# Patient Record
Sex: Male | Born: 1985 | Race: White | Hispanic: No | Smoking: Former smoker
Health system: Southern US, Community
[De-identification: ages and names within clinical notes are randomized; demographics above are authoritative.]

## PROBLEM LIST (undated history)

## (undated) DIAGNOSIS — U071 COVID-19: Secondary | ICD-10-CM

## (undated) DIAGNOSIS — F329 Major depressive disorder, single episode, unspecified: Secondary | ICD-10-CM

## (undated) DIAGNOSIS — B019 Varicella without complication: Secondary | ICD-10-CM

## (undated) DIAGNOSIS — F1011 Alcohol abuse, in remission: Secondary | ICD-10-CM

## (undated) DIAGNOSIS — K5190 Ulcerative colitis, unspecified, without complications: Secondary | ICD-10-CM

## (undated) DIAGNOSIS — E119 Type 2 diabetes mellitus without complications: Secondary | ICD-10-CM

## (undated) DIAGNOSIS — E785 Hyperlipidemia, unspecified: Secondary | ICD-10-CM

## (undated) DIAGNOSIS — F32A Depression, unspecified: Secondary | ICD-10-CM

## (undated) DIAGNOSIS — I10 Essential (primary) hypertension: Secondary | ICD-10-CM

## (undated) HISTORY — DX: Varicella without complication: B01.9

## (undated) HISTORY — DX: Ulcerative colitis, unspecified, without complications: K51.90

## (undated) HISTORY — DX: COVID-19: U07.1

## (undated) HISTORY — DX: Depression, unspecified: F32.A

## (undated) HISTORY — DX: Hyperlipidemia, unspecified: E78.5

## (undated) HISTORY — DX: Essential (primary) hypertension: I10

## (undated) HISTORY — DX: Alcohol abuse, in remission: F10.11

## (undated) HISTORY — DX: Major depressive disorder, single episode, unspecified: F32.9

## (undated) HISTORY — DX: Type 2 diabetes mellitus without complications: E11.9

## (undated) HISTORY — PX: MANDIBLE FRACTURE SURGERY: SHX706

---

## 2011-09-03 DIAGNOSIS — Z87891 Personal history of nicotine dependence: Secondary | ICD-10-CM | POA: Insufficient documentation

## 2011-12-23 ENCOUNTER — Ambulatory Visit: Payer: Self-pay | Admitting: Otolaryngology

## 2014-05-01 DIAGNOSIS — F1021 Alcohol dependence, in remission: Secondary | ICD-10-CM | POA: Insufficient documentation

## 2016-06-04 ENCOUNTER — Ambulatory Visit: Payer: 59 | Admitting: Family Medicine

## 2016-06-11 ENCOUNTER — Encounter: Payer: Self-pay | Admitting: Family Medicine

## 2016-06-11 ENCOUNTER — Ambulatory Visit (INDEPENDENT_AMBULATORY_CARE_PROVIDER_SITE_OTHER): Payer: 59 | Admitting: Family Medicine

## 2016-06-11 VITALS — BP 132/92 | HR 71 | Temp 99.3°F | Ht 71.7 in | Wt 242.4 lb

## 2016-06-11 DIAGNOSIS — F1010 Alcohol abuse, uncomplicated: Secondary | ICD-10-CM

## 2016-06-11 DIAGNOSIS — F418 Other specified anxiety disorders: Secondary | ICD-10-CM

## 2016-06-11 DIAGNOSIS — E119 Type 2 diabetes mellitus without complications: Secondary | ICD-10-CM | POA: Diagnosis not present

## 2016-06-11 DIAGNOSIS — F419 Anxiety disorder, unspecified: Secondary | ICD-10-CM

## 2016-06-11 DIAGNOSIS — Z130 Encounter for screening for diseases of the blood and blood-forming organs and certain disorders involving the immune mechanism: Secondary | ICD-10-CM

## 2016-06-11 DIAGNOSIS — F329 Major depressive disorder, single episode, unspecified: Secondary | ICD-10-CM

## 2016-06-11 DIAGNOSIS — I10 Essential (primary) hypertension: Secondary | ICD-10-CM

## 2016-06-11 DIAGNOSIS — F32A Depression, unspecified: Secondary | ICD-10-CM

## 2016-06-11 DIAGNOSIS — E785 Hyperlipidemia, unspecified: Secondary | ICD-10-CM | POA: Diagnosis not present

## 2016-06-11 LAB — COMPREHENSIVE METABOLIC PANEL
ALT: 62 U/L — AB (ref 0–53)
AST: 30 U/L (ref 0–37)
Albumin: 4.7 g/dL (ref 3.5–5.2)
Alkaline Phosphatase: 58 U/L (ref 39–117)
BUN: 15 mg/dL (ref 6–23)
CALCIUM: 10.4 mg/dL (ref 8.4–10.5)
CHLORIDE: 101 meq/L (ref 96–112)
CO2: 27 mEq/L (ref 19–32)
Creatinine, Ser: 0.97 mg/dL (ref 0.40–1.50)
GFR: 95.86 mL/min (ref >60.00)
GLUCOSE: 118 mg/dL — AB (ref 70–99)
POTASSIUM: 3.7 meq/L (ref 3.5–5.1)
Sodium: 136 mEq/L (ref 135–145)
Total Bilirubin: 0.4 mg/dL (ref 0.2–1.2)
Total Protein: 7.5 g/dL (ref 6.0–8.3)

## 2016-06-11 LAB — CBC
HEMATOCRIT: 41.6 % (ref 39.0–52.0)
HEMOGLOBIN: 13.9 g/dL (ref 13.0–17.0)
MCHC: 33.5 g/dL (ref 30.0–36.0)
MCV: 84.3 fl (ref 78.0–100.0)
PLATELETS: 231 10*3/uL (ref 150.0–400.0)
RBC: 4.94 Mil/uL (ref 4.22–5.81)
RDW: 13.5 % (ref 11.5–15.5)
WBC: 9.4 10*3/uL (ref 4.0–10.5)

## 2016-06-11 LAB — HEMOGLOBIN A1C: Hgb A1c MFr Bld: 7.4 % — ABNORMAL HIGH (ref 4.6–6.5)

## 2016-06-11 NOTE — Patient Instructions (Signed)
Continue current medications.  We will call you with lab results.  Follow up in 6 months. Maybe sooner depending on your lab work.  Take care  Dr. Adriana Simasook

## 2016-06-14 ENCOUNTER — Telehealth: Payer: Self-pay | Admitting: Family Medicine

## 2016-06-14 ENCOUNTER — Encounter: Payer: Self-pay | Admitting: Family Medicine

## 2016-06-14 DIAGNOSIS — Z794 Long term (current) use of insulin: Secondary | ICD-10-CM | POA: Insufficient documentation

## 2016-06-14 DIAGNOSIS — I10 Essential (primary) hypertension: Secondary | ICD-10-CM | POA: Insufficient documentation

## 2016-06-14 DIAGNOSIS — F1010 Alcohol abuse, uncomplicated: Secondary | ICD-10-CM | POA: Insufficient documentation

## 2016-06-14 DIAGNOSIS — F32A Depression, unspecified: Secondary | ICD-10-CM | POA: Insufficient documentation

## 2016-06-14 DIAGNOSIS — F419 Anxiety disorder, unspecified: Secondary | ICD-10-CM | POA: Insufficient documentation

## 2016-06-14 DIAGNOSIS — F329 Major depressive disorder, single episode, unspecified: Secondary | ICD-10-CM | POA: Insufficient documentation

## 2016-06-14 DIAGNOSIS — E785 Hyperlipidemia, unspecified: Secondary | ICD-10-CM | POA: Insufficient documentation

## 2016-06-14 DIAGNOSIS — E119 Type 2 diabetes mellitus without complications: Secondary | ICD-10-CM | POA: Insufficient documentation

## 2016-06-14 DIAGNOSIS — E1165 Type 2 diabetes mellitus with hyperglycemia: Secondary | ICD-10-CM | POA: Insufficient documentation

## 2016-06-14 NOTE — Progress Notes (Signed)
Subjective:  Patient ID: Jorge Wright, male    DOB: [birth year]  Age: 30 y.o. MRN: [id number]  CC: Establish care  HPI Jorge Wright is a 30 y.o. male presents to the clinic today to establish care. Issues/problems are below.  DM-2  Blood sugars readings - Not checking.  Hypoglycemia - No.  Medications - Metformin 500 mg BID.  Adverse effects - No.  Compliance - Yes. Preventative care  Eye exam - In need of   Foot exam - Will do today.  Last A1C - In need of.  Urine microalbumin - On ACEI.  On Statin.   HTN  Stable on Lisinopril 10 mg daily. No reported side effects.  HLD  Stable but has not been at goal.  Will discuss increasing dose.  Anxiety  Stable on Lexapro and Buspar.  PMH, Surgical Hx, Family Hx, Social History reviewed and updated as below.  Past Medical History  Diagnosis Date  . Chicken pox   . Depression   . Diabetes mellitus without complication ([place])   . Alcohol abuse   . Hypertension   . Hyperlipidemia    Past Surgical History  Procedure Laterality Date  . Mandible fracture surgery      Family History  Problem Relation Age of Onset  . Alcohol abuse Mother   . Hypertension Mother   . Heart disease Mother   . Diabetes Mother   . Alcohol abuse Father   . Hypertension Father   . Diabetes Father   . Alcohol abuse Maternal Grandmother   . Heart disease Maternal Grandmother   . Hypertension Maternal Grandmother   . Stroke Paternal Grandfather    Social History  Substance Use Topics  . Smoking status: Former Research scientist (life sciences)  . Smokeless tobacco: Former Systems developer  . Alcohol Use: 10.8 oz/week    18 Standard drinks or equivalent per week   Review of Systems General: Denies unexplained weight loss, fever. Skin: Denies new or changing mole, sore/wound that won't heal. ENT: Trouble hearing, ringing in the ears, sores in the mouth, hoarseness, trouble swallowing. Eyes: Denies trouble seeing/visual disturbance. Heart/CV: Denies chest  pain, shortness of breath, edema, palpitations. Lungs/Resp: Denies cough, shortness of breath, hemoptysis. Abd/GI: Denies nausea, vomiting, diarrhea, constipation, abdominal pain, hematochezia, melena. GU: Denies dysuria, incontinence, hematuria, urinary frequency, difficulty starting/keeping stream, penile discharge, sexual difficulty, lump in testicles. MSK: Denies joint pain/swelling, myalgias. Neuro: Denies headaches, weakness, numbness, dizziness, syncope. Psych: Denies sadness, anxiety, stress, memory difficulty. Endocrine: Denies polyuria and polydipsia.  Objective:   Today's Vitals: BP 132/92 mmHg  Pulse 71  Temp(Src) 99.3 F (37.4 C) (Oral)  Ht 5' 11.7" (1.821 m)  Wt 242 lb 6 oz (109.941 kg)  BMI 33.15 kg/m2  SpO2 98%  Physical Exam  Constitutional: He is oriented to person, place, and time. He appears well-developed and well-nourished. No distress.  HENT:  Head: Normocephalic and atraumatic.  Nose: Nose normal.  Mouth/Throat: Oropharynx is clear and moist. No oropharyngeal exudate.  Normal TM's bilaterally.   Eyes: Conjunctivae are normal. No scleral icterus.  Neck: Neck supple. No thyromegaly present.  Cardiovascular: Normal rate and regular rhythm.   No murmur heard. Pulmonary/Chest: Effort normal and breath sounds normal. He has no wheezes. He has no rales.  Abdominal: Soft. He exhibits no distension. There is no tenderness. There is no rebound and no guarding.  Musculoskeletal: Normal range of motion. He exhibits no edema.  Lymphadenopathy:    He has no cervical adenopathy.  Neurological: He is alert  and oriented to person, place, and time.  Skin: Skin is warm and dry. No rash noted.  Psychiatric: He has a normal mood and affect.  Vitals reviewed. Diabetic Foot Check -  Appearance - no lesions, ulcers or calluses Skin - no unusual pallor or redness Monofilament testing -  Right - Great toe, medial, central, lateral ball and posterior foot intact Left - Great  toe, medial, central, lateral ball and posterior foot intact  Assessment & Plan:   Problem List Items Addressed This Visit    Alcohol abuse    Advised to cut back.      Anxiety and depression    Stable on Lexapro and Buspar. Will continue.      DM type 2 (diabetes mellitus, type 2) (HCC)    A1C 7.4. Continue metformin.  Will discuss adding additional med with patient (A1C resulted after visit).       Relevant Medications   atorvastatin (LIPITOR) 20 MG tablet   lisinopril (PRINIVIL,ZESTRIL) 10 MG tablet   metFORMIN (GLUCOPHAGE-XR) 500 MG 24 hr tablet   Other Relevant Orders   Comp Met (CMET) (Completed)   HgB A1c (Completed)   HLD (hyperlipidemia)    Not at goal. Increasing Lipitor to 40 mg.      Relevant Medications   atorvastatin (LIPITOR) 20 MG tablet   lisinopril (PRINIVIL,ZESTRIL) 10 MG tablet   HTN (hypertension)    Stable. Continue Lisinopril.      Relevant Medications   atorvastatin (LIPITOR) 20 MG tablet   lisinopril (PRINIVIL,ZESTRIL) 10 MG tablet    Other Visit Diagnoses    Screening for deficiency anemia    -  Primary    Relevant Orders    CBC (Completed)       Outpatient Encounter Prescriptions as of 06/11/2016  Medication Sig  . atorvastatin (LIPITOR) 20 MG tablet daily.  . busPIRone (BUSPAR) 10 MG tablet 2 (two) times daily.  Marland Kitchen escitalopram (LEXAPRO) 20 MG tablet daily.  Marland Kitchen lisinopril (PRINIVIL,ZESTRIL) 10 MG tablet daily.  . metFORMIN (GLUCOPHAGE-XR) 500 MG 24 hr tablet 2 (two) times daily.   No facility-administered encounter medications on file as of 06/11/2016.    Follow-up: Return for Follow up Chronic medical issues.  [place]

## 2016-06-14 NOTE — Assessment & Plan Note (Signed)
A1C 7.4. Continue metformin.  Will discuss adding additional med with patient (A1C resulted after visit).

## 2016-06-14 NOTE — Telephone Encounter (Signed)
Returning [name]'s call regarding lab results.

## 2016-06-14 NOTE — Telephone Encounter (Signed)
Pt called and given results 

## 2016-06-14 NOTE — Assessment & Plan Note (Signed)
Stable on Lexapro and Buspar. Will continue.

## 2016-06-14 NOTE — Assessment & Plan Note (Signed)
Advised to cut back

## 2016-06-14 NOTE — Assessment & Plan Note (Signed)
Not at goal. Increasing Lipitor to 40 mg.

## 2016-06-14 NOTE — Assessment & Plan Note (Signed)
Stable. Continue Lisinopril.  

## 2016-06-15 ENCOUNTER — Other Ambulatory Visit: Payer: Self-pay | Admitting: Family Medicine

## 2016-06-15 MED ORDER — CANAGLIFLOZIN 100 MG PO TABS: 100.0000 mg | ORAL_TABLET | Freq: Every day | ORAL | Status: DC

## 2016-09-13 ENCOUNTER — Ambulatory Visit: Payer: 59 | Admitting: Family Medicine

## 2016-09-29 ENCOUNTER — Ambulatory Visit (INDEPENDENT_AMBULATORY_CARE_PROVIDER_SITE_OTHER): Payer: Managed Care, Other (non HMO) | Admitting: Family Medicine

## 2016-09-29 VITALS — BP 134/82 | HR 75 | Temp 98.5°F | Wt 230.5 lb

## 2016-09-29 DIAGNOSIS — F1010 Alcohol abuse, uncomplicated: Secondary | ICD-10-CM

## 2016-09-29 DIAGNOSIS — I10 Essential (primary) hypertension: Secondary | ICD-10-CM

## 2016-09-29 DIAGNOSIS — Z23 Encounter for immunization: Secondary | ICD-10-CM

## 2016-09-29 DIAGNOSIS — E119 Type 2 diabetes mellitus without complications: Secondary | ICD-10-CM | POA: Diagnosis not present

## 2016-09-29 DIAGNOSIS — E785 Hyperlipidemia, unspecified: Secondary | ICD-10-CM | POA: Diagnosis not present

## 2016-09-29 LAB — LIPID PANEL
CHOL/HDL RATIO: 5
Cholesterol: 183 mg/dL (ref 0–200)
HDL: 39.6 mg/dL (ref >39.00)
NONHDL: 143.69
TRIGLYCERIDES: 207 mg/dL — AB (ref 0.0–149.0)
VLDL: 41.4 mg/dL — ABNORMAL HIGH (ref 0.0–40.0)

## 2016-09-29 LAB — LDL CHOLESTEROL, DIRECT: LDL DIRECT: 118 mg/dL

## 2016-09-29 LAB — HEMOGLOBIN A1C: Hgb A1c MFr Bld: 6.8 % — ABNORMAL HIGH (ref 4.6–6.5)

## 2016-09-29 MED ORDER — LISINOPRIL 10 MG PO TABS: 10.0000 mg | ORAL_TABLET | Freq: Every day | ORAL | 3 refills | Status: DC

## 2016-09-29 NOTE — Assessment & Plan Note (Signed)
Well controlled. We'll continue lisinopril. Refilled today.

## 2016-09-29 NOTE — Assessment & Plan Note (Signed)
Stable. Rechecking A1C today. Continue metformin. Will add Invokana if needed based on A1c.

## 2016-09-29 NOTE — Progress Notes (Signed)
Pre visit review using our clinic review tool, if applicable. No additional management support is needed unless otherwise documented below in the visit note. 

## 2016-09-29 NOTE — Patient Instructions (Signed)
We will call with your results.  Follow up in 6 months.  Take care  Dr. Ryley Bachtel  

## 2016-09-29 NOTE — Assessment & Plan Note (Signed)
Improved. Patient congratulated on cutting back.

## 2016-09-29 NOTE — Progress Notes (Signed)
Subjective:  Patient ID: Jorge Wright, male    DOB: 03-06-1986  Age: 30 y.o. MRN: [id number]  CC: Follow up  HPI:  30 year old male with DM 2, hypertension, hyperlipidemia, alcohol abuse presents for follow up.  DM-2  Most recent A1c was 7.4.  Patient has made some dietary and lifestyle changes. He has cut back on his alcohol intake and has been exercising regular.  In need of A1c today.  Endorses compliance with metformin.  Has not yet started the Invokana.  HTN  Well controlled on lisinopril.  In need of refill.  HLD  Recently increased Lipitor.  Unsure of control. Most recent LDL was 134 (2016).  In need of lipid panel today.  Alcohol abuse  Patient has cut back significantly.  He now drinking a sixpack on the weekends. He was previously drinking 18 beers a week.  Social Hx    Social History   Social History  . Marital status: Married    Spouse name: N/A  . Number of children: N/A  . Years of education: N/A   Social History Main Topics  . Smoking status: Former Games developer  . Smokeless tobacco: Former Neurosurgeon  . Alcohol use 10.8 oz/week    18 Standard drinks or equivalent per week  . Drug use:     Types: Marijuana  . Sexual activity: Not on file   Other Topics Concern  . Not on file   Social History Narrative  . No narrative on file   Review of Systems  Constitutional: Negative.   Gastrointestinal:       Hemorrhoid.   Objective:  BP 134/82 (BP Location: Left Arm, Patient Position: Sitting, Cuff Size: Large)   Pulse 75   Temp 98.5 F (36.9 C) (Oral)   Wt 230 lb 8 oz (104.6 kg)   SpO2 98%   BMI 31.52 kg/m   BP/Weight 09/29/2016 06/11/2016  Systolic BP 134 132  Diastolic BP 82 92  Wt. (Lbs) 230.5 242.38  BMI 31.52 33.15   Physical Exam  Constitutional: He is oriented to person, place, and time. He appears well-developed. No distress.  Cardiovascular: Normal rate and regular rhythm.   Pulmonary/Chest: Effort normal. He has no wheezes.  He has no rales.  Abdominal: Soft. He exhibits no distension. There is no tenderness. There is no rebound and no guarding.  Neurological: He is alert and oriented to person, place, and time.  Psychiatric: He has a normal mood and affect.  Vitals reviewed.  Lab Results  Component Value Date   WBC 9.4 06/11/2016   HGB 13.9 06/11/2016   HCT 41.6 06/11/2016   PLT 231.0 06/11/2016   GLUCOSE 118 (H) 06/11/2016   ALT 62 (H) 06/11/2016   AST 30 06/11/2016   NA 136 06/11/2016   K 3.7 06/11/2016   CL 101 06/11/2016   CREATININE 0.97 06/11/2016   BUN 15 06/11/2016   CO2 27 06/11/2016   HGBA1C 7.4 (H) 06/11/2016   Assessment & Plan:   Problem List Items Addressed This Visit    DM type 2 (diabetes mellitus, type 2) (HCC) - Primary    Stable. Rechecking A1C today. Continue metformin. Will add Invokana if needed based on A1c.      Relevant Medications   lisinopril (PRINIVIL,ZESTRIL) 10 MG tablet   Other Relevant Orders   HgB A1c   HTN (hypertension)    Well controlled. We'll continue lisinopril. Refilled today.      Relevant Medications   lisinopril (PRINIVIL,ZESTRIL) 10 MG tablet  HLD (hyperlipidemia)    Unsure of control. Lipid panel today. Continue Lipitor. Will adjust the dose if needed based on lipid panel.      Relevant Medications   lisinopril (PRINIVIL,ZESTRIL) 10 MG tablet   Other Relevant Orders   Lipid Profile   Alcohol abuse    Improved. Patient congratulated on cutting back.       Other Visit Diagnoses    Encounter for immunization       Relevant Orders   Flu Vaccine QUAD 36+ mos IM (Completed)      Meds ordered this encounter  Medications  . lisinopril (PRINIVIL,ZESTRIL) 10 MG tablet    Sig: Take 1 tablet (10 mg total) by mouth daily.    Dispense:  90 tablet    Refill:  3   Follow-up: Return in about 6 months (around 03/30/2017).  Everlene Other DO [employer] [facility]

## 2016-09-29 NOTE — Assessment & Plan Note (Signed)
Unsure of control. Lipid panel today. Continue Lipitor. Will adjust the dose if needed based on lipid panel.

## 2016-11-24 ENCOUNTER — Encounter: Payer: Self-pay | Admitting: Family Medicine

## 2016-11-24 ENCOUNTER — Other Ambulatory Visit: Payer: Self-pay | Admitting: Family Medicine

## 2016-11-24 MED ORDER — ESCITALOPRAM OXALATE 20 MG PO TABS: 20.0000 mg | ORAL_TABLET | Freq: Every day | ORAL | 2 refills | Status: DC

## 2016-11-24 MED ORDER — BUSPIRONE HCL 10 MG PO TABS: 10.0000 mg | ORAL_TABLET | Freq: Two times a day (BID) | ORAL | 2 refills | Status: DC

## 2016-11-24 MED ORDER — METFORMIN HCL ER 500 MG PO TB24: 500.0000 mg | ORAL_TABLET | Freq: Two times a day (BID) | ORAL | 1 refills | Status: DC

## 2016-11-24 MED ORDER — ATORVASTATIN CALCIUM 40 MG PO TABS: 40.0000 mg | ORAL_TABLET | Freq: Every day | ORAL | 3 refills | Status: DC

## 2016-11-24 NOTE — Telephone Encounter (Signed)
Historical medications. Pt last seen 09/29/16/ please advise?

## 2017-04-22 ENCOUNTER — Ambulatory Visit (INDEPENDENT_AMBULATORY_CARE_PROVIDER_SITE_OTHER): Payer: Managed Care, Other (non HMO) | Admitting: Family Medicine

## 2017-04-22 ENCOUNTER — Encounter: Payer: Self-pay | Admitting: Family Medicine

## 2017-04-22 VITALS — BP 122/88 | HR 81 | Temp 97.5°F | Wt 234.2 lb

## 2017-04-22 DIAGNOSIS — Z0000 Encounter for general adult medical examination without abnormal findings: Secondary | ICD-10-CM

## 2017-04-22 LAB — COMPREHENSIVE METABOLIC PANEL
ALK PHOS: 61 U/L (ref 39–117)
ALT: 106 U/L — ABNORMAL HIGH (ref 0–53)
AST: 59 U/L — ABNORMAL HIGH (ref 0–37)
Albumin: 4.5 g/dL (ref 3.5–5.2)
BUN: 15 mg/dL (ref 6–23)
CHLORIDE: 101 meq/L (ref 96–112)
CO2: 25 mEq/L (ref 19–32)
Calcium: 9.6 mg/dL (ref 8.4–10.5)
Creatinine, Ser: 1 mg/dL (ref 0.40–1.50)
GFR: 92.04 mL/min (ref >60.00)
GLUCOSE: 277 mg/dL — AB (ref 70–99)
POTASSIUM: 4.2 meq/L (ref 3.5–5.1)
SODIUM: 134 meq/L — AB (ref 135–145)
TOTAL PROTEIN: 7.2 g/dL (ref 6.0–8.3)
Total Bilirubin: 0.5 mg/dL (ref 0.2–1.2)

## 2017-04-22 LAB — CBC
HEMATOCRIT: 41.5 % (ref 39.0–52.0)
Hemoglobin: 14 g/dL (ref 13.0–17.0)
MCHC: 33.7 g/dL (ref 30.0–36.0)
MCV: 84.7 fl (ref 78.0–100.0)
Platelets: 207 10*3/uL (ref 150.0–400.0)
RBC: 4.9 Mil/uL (ref 4.22–5.81)
RDW: 13.1 % (ref 11.5–15.5)
WBC: 7.3 10*3/uL (ref 4.0–10.5)

## 2017-04-22 LAB — LIPID PANEL
CHOL/HDL RATIO: 6
CHOLESTEROL: 185 mg/dL (ref 0–200)
HDL: 29.9 mg/dL — AB (ref >39.00)
Triglycerides: 595 mg/dL — ABNORMAL HIGH (ref 0.0–149.0)

## 2017-04-22 LAB — HEMOGLOBIN A1C: Hgb A1c MFr Bld: 10.3 % — ABNORMAL HIGH (ref 4.6–6.5)

## 2017-04-22 LAB — LDL CHOLESTEROL, DIRECT: LDL DIRECT: 58 mg/dL

## 2017-04-22 MED ORDER — LISINOPRIL 10 MG PO TABS: 10.0000 mg | ORAL_TABLET | Freq: Every day | ORAL | 3 refills | Status: DC

## 2017-04-22 MED ORDER — BLOOD GLUCOSE MONITOR KIT: PACK | 0 refills | Status: DC

## 2017-04-22 MED ORDER — METFORMIN HCL ER 500 MG PO TB24: 500.0000 mg | ORAL_TABLET | Freq: Two times a day (BID) | ORAL | 1 refills | Status: DC

## 2017-04-22 MED ORDER — ESCITALOPRAM OXALATE 20 MG PO TABS: 20.0000 mg | ORAL_TABLET | Freq: Every day | ORAL | 2 refills | Status: DC

## 2017-04-22 MED ORDER — CANAGLIFLOZIN 100 MG PO TABS: 100.0000 mg | ORAL_TABLET | Freq: Every day | ORAL | 0 refills | Status: DC

## 2017-04-22 NOTE — Progress Notes (Signed)
Subjective:  Patient ID: Jorge Wright, male    DOB: [birth year]  Age: 31 y.o. MRN: [id number]  CC: Annual physical  HPI:  31 year old male with HTN, HLD, DM-2 presents for an annual physical exam.  Preventative Healthcare  Colonoscopy: Not indicated.  Immunizations  Tetanus - Unsure.  Pneumococcal - Up to date.   Flu - Up to date.  Labs: Labs today.   Exercise: Tries to exercise regularly.  Alcohol use: See below.  Smoking/tobacco use: Former.  STD/HIV testing: Declines.   PMH, Surgical Hx, Family Hx, Social History reviewed and updated as below.  Past Medical History:  Diagnosis Date  . Chicken pox   . Depression   . Diabetes mellitus without complication ([place])   . History of alcohol abuse   . Hyperlipidemia   . Hypertension    Past Surgical History:  Procedure Laterality Date  . MANDIBLE FRACTURE SURGERY     Family History  Problem Relation Age of Onset  . Alcohol abuse Mother   . Hypertension Mother   . Heart disease Mother   . Diabetes Mother   . Alcohol abuse Father   . Hypertension Father   . Diabetes Father   . Alcohol abuse Maternal Grandmother   . Heart disease Maternal Grandmother   . Hypertension Maternal Grandmother   . Stroke Paternal Grandfather     Social History  Substance Use Topics  . Smoking status: Former Research scientist (life sciences)  . Smokeless tobacco: Former Systems developer  . Alcohol use 3.6 oz/week    6 Standard drinks or equivalent per week   Review of Systems General: Denies unexplained weight loss, fever. Skin: Denies new or changing mole, sore/wound that won't heal. ENT: Trouble hearing, ringing in the ears, sores in the mouth, hoarseness, trouble swallowing. Eyes: Denies trouble seeing/visual disturbance. Heart/CV: Denies chest pain, shortness of breath, edema, palpitations. Lungs/Resp: Denies cough, shortness of breath, hemoptysis. Abd/GI: Denies nausea, vomiting, diarrhea, constipation, abdominal pain, hematochezia, melena. GU: Denies  dysuria, incontinence, hematuria, urinary frequency, difficulty starting/keeping stream, penile discharge, sexual difficulty, lump in testicles. MSK: Denies joint pain/swelling, myalgias. Neuro: Denies headaches, weakness, numbness, dizziness, syncope. Psych: Denies sadness, anxiety, stress, memory difficulty. Endocrine: Denies polyuria and polydipsia.   Objective:  BP 122/88   Pulse 81   Temp 97.5 F (36.4 C) (Oral)   Wt 234 lb 4 oz (106.3 kg)   SpO2 98%   BMI 32.04 kg/m   BP/Weight 04/22/2017 09/29/2016 9/35/7017  Systolic BP 793 903 009  Diastolic BP 88 82 92  Wt. (Lbs) 234.25 230.5 242.38  BMI 32.04 31.52 33.15    Physical Exam  Constitutional: He is oriented to person, place, and time. He appears well-developed and well-nourished. No distress.  HENT:  Head: Normocephalic and atraumatic.  Nose: Nose normal.  Mouth/Throat: Oropharynx is clear and moist. No oropharyngeal exudate.  Normal TM's bilaterally.   Eyes: Conjunctivae are normal. No scleral icterus.  Neck: Neck supple.  Cardiovascular: Normal rate and regular rhythm.   No murmur heard. Pulmonary/Chest: Effort normal and breath sounds normal. He has no wheezes. He has no rales.  Abdominal: Soft. He exhibits no distension. There is no tenderness. There is no rebound and no guarding.  Musculoskeletal: Normal range of motion. He exhibits no edema.  Lymphadenopathy:    He has no cervical adenopathy.  Neurological: He is alert and oriented to person, place, and time.  Skin: Skin is warm and dry. No rash noted.  Psychiatric: He has a normal mood and affect.  Vitals reviewed.  Lab Results  Component Value Date   WBC 9.4 06/11/2016   HGB 13.9 06/11/2016   HCT 41.6 06/11/2016   PLT 231.0 06/11/2016   GLUCOSE 118 (H) 06/11/2016   CHOL 183 09/29/2016   TRIG 207.0 (H) 09/29/2016   HDL 39.60 09/29/2016   LDLDIRECT 118.0 09/29/2016   ALT 62 (H) 06/11/2016   AST 30 06/11/2016   NA 136 06/11/2016   K 3.7 06/11/2016     CL 101 06/11/2016   CREATININE 0.97 06/11/2016   BUN 15 06/11/2016   CO2 27 06/11/2016   HGBA1C 6.8 (H) 09/29/2016    Assessment & Plan:   Problem List Items Addressed This Visit    Annual physical exam - Primary    Unsure of last tetanus. Declines HIV screening. Remainder of preventative health up to date. Labs today.      Relevant Orders   CBC   Hemoglobin A1c   Comprehensive metabolic panel   Lipid panel      Meds ordered this encounter  Medications  . metFORMIN (GLUCOPHAGE-XR) 500 MG 24 hr tablet    Sig: Take 1 tablet (500 mg total) by mouth 2 (two) times daily.    Dispense:  180 tablet    Refill:  1  . lisinopril (PRINIVIL,ZESTRIL) 10 MG tablet    Sig: Take 1 tablet (10 mg total) by mouth daily.    Dispense:  90 tablet    Refill:  3  . escitalopram (LEXAPRO) 20 MG tablet    Sig: Take 1 tablet (20 mg total) by mouth daily.    Dispense:  90 tablet    Refill:  2  . canagliflozin (INVOKANA) 100 MG TABS tablet    Sig: Take 1 tablet (100 mg total) by mouth daily before breakfast.    Dispense:  90 tablet    Refill:  0  . blood glucose meter kit and supplies KIT    Sig: Dispense based on patient and insurance preference. Use up to four times daily as directed.    Dispense:  1 each    Refill:  0    Order Specific Question:   Number of strips    Answer:   100    Order Specific Question:   Number of lancets    Answer:   100   Follow-up: Annually  Thersa Salt DO [facility]

## 2017-04-22 NOTE — Assessment & Plan Note (Signed)
Unsure of last tetanus. Declines HIV screening. Remainder of preventative health up to date. Labs today.

## 2017-04-22 NOTE — Patient Instructions (Signed)
Follow up annually.  Continue your meds.  Take care  Dr. Adriana Simas    Health Maintenance, Male A healthy lifestyle and preventive care is important for your health and wellness. Ask your health care provider about what schedule of regular examinations is right for you. What should I know about weight and diet?  Eat a Healthy Diet  Eat plenty of vegetables, fruits, whole grains, low-fat dairy products, and lean protein.  Do not eat a lot of foods high in solid fats, added sugars, or salt. Maintain a Healthy Weight  Regular exercise can help you achieve or maintain a healthy weight. You should:  Do at least 150 minutes of exercise each week. The exercise should increase your heart rate and make you sweat (moderate-intensity exercise).  Do strength-training exercises at least twice a week. Watch Your Levels of Cholesterol and Blood Lipids  Have your blood tested for lipids and cholesterol every 5 years starting at 32 years of age. If you are at high risk for heart disease, you should start having your blood tested when you are 32 years old. You may need to have your cholesterol levels checked more often if:  Your lipid or cholesterol levels are high.  You are older than 32 years of age.  You are at high risk for heart disease. What should I know about cancer screening? Many types of cancers can be detected early and may often be prevented. Lung Cancer  You should be screened every year for lung cancer if:  You are a current smoker who has smoked for at least 30 years.  You are a former smoker who has quit within the past 15 years.  Talk to your health care provider about your screening options, when you should start screening, and how often you should be screened. Colorectal Cancer  Routine colorectal cancer screening usually begins at 32 years of age and should be repeated every 5-10 years until you are 32 years old. You may need to be screened more often if early forms of  precancerous polyps or small growths are found. Your health care provider may recommend screening at an earlier age if you have risk factors for colon cancer.  Your health care provider may recommend using home test kits to check for hidden blood in the stool.  A small camera at the end of a tube can be used to examine your colon (sigmoidoscopy or colonoscopy). This checks for the earliest forms of colorectal cancer. Prostate and Testicular Cancer  Depending on your age and overall health, your health care provider may do certain tests to screen for prostate and testicular cancer.  Talk to your health care provider about any symptoms or concerns you have about testicular or prostate cancer. Skin Cancer  Check your skin from head to toe regularly.  Tell your health care provider about any new moles or changes in moles, especially if:  There is a change in a mole's size, shape, or color.  You have a mole that is larger than a pencil eraser.  Always use sunscreen. Apply sunscreen liberally and repeat throughout the day.  Protect yourself by wearing long sleeves, pants, a wide-brimmed hat, and sunglasses when outside. What should I know about heart disease, diabetes, and high blood pressure?  If you are 100-49 years of age, have your blood pressure checked every 3-5 years. If you are 56 years of age or older, have your blood pressure checked every year. You should have your blood pressure measured twice-once  when you are at a hospital or clinic, and once when you are not at a hospital or clinic. Record the average of the two measurements. To check your blood pressure when you are not at a hospital or clinic, you can use:  An automated blood pressure machine at a pharmacy.  A home blood pressure monitor.  Talk to your health care provider about your target blood pressure.  If you are between 10-29 years old, ask your health care provider if you should take aspirin to prevent heart  disease.  Have regular diabetes screenings by checking your fasting blood sugar level.  If you are at a normal weight and have a low risk for diabetes, have this test once every three years after the age of 45.  If you are overweight and have a high risk for diabetes, consider being tested at a younger age or more often.  A one-time screening for abdominal aortic aneurysm (AAA) by ultrasound is recommended for men aged 58-75 years who are current or former smokers. What should I know about preventing infection? Hepatitis B  If you have a higher risk for hepatitis B, you should be screened for this virus. Talk with your health care provider to find out if you are at risk for hepatitis B infection. Hepatitis C  Blood testing is recommended for:  Everyone born from [birth year] through 1966.  Anyone with known risk factors for hepatitis C. Sexually Transmitted Diseases (STDs)  You should be screened each year for STDs including gonorrhea and chlamydia if:  You are sexually active and are younger than 32 years of age.  You are older than 32 years of age and your health care provider tells you that you are at risk for this type of infection.  Your sexual activity has changed since you were last screened and you are at an increased risk for chlamydia or gonorrhea. Ask your health care provider if you are at risk.  Talk with your health care provider about whether you are at high risk of being infected with HIV. Your health care provider may recommend a prescription medicine to help prevent HIV infection. What else can I do?  Schedule regular health, dental, and eye exams.  Stay current with your vaccines (immunizations).  Do not use any tobacco products, such as cigarettes, chewing tobacco, and e-cigarettes. If you need help quitting, ask your health care provider.  Limit alcohol intake to no more than 2 drinks per day. One drink equals 12 ounces of beer, 5 ounces of wine, or 1 ounces of hard  liquor.  Do not use street drugs.  Do not share needles.  Ask your health care provider for help if you need support or information about quitting drugs.  Tell your health care provider if you often feel depressed.  Tell your health care provider if you have ever been abused or do not feel safe at home. This information is not intended to replace advice given to you by your health care provider. Make sure you discuss any questions you have with your health care provider. Document Released: 06/10/2008 Document Revised: 08/11/2016 Document Reviewed: 09/16/2015 Elsevier Interactive Patient Education  2017 [employer].

## 2017-04-22 NOTE — Progress Notes (Signed)
Pre visit review using our clinic review tool, if applicable. No additional management support is needed unless otherwise documented below in the visit note. 

## 2017-04-28 ENCOUNTER — Encounter: Payer: Self-pay | Admitting: Family Medicine

## 2017-05-02 ENCOUNTER — Ambulatory Visit (INDEPENDENT_AMBULATORY_CARE_PROVIDER_SITE_OTHER): Payer: 59 | Admitting: Pharmacist

## 2017-05-02 ENCOUNTER — Encounter: Payer: Self-pay | Admitting: Pharmacist

## 2017-05-02 DIAGNOSIS — I10 Essential (primary) hypertension: Secondary | ICD-10-CM

## 2017-05-02 DIAGNOSIS — E119 Type 2 diabetes mellitus without complications: Secondary | ICD-10-CM | POA: Diagnosis not present

## 2017-05-02 DIAGNOSIS — E785 Hyperlipidemia, unspecified: Secondary | ICD-10-CM | POA: Diagnosis not present

## 2017-05-02 MED ORDER — DULAGLUTIDE 1.5 MG/0.5ML ~~LOC~~ SOAJ: 1.5000 mg | SUBCUTANEOUS | 0 refills | Status: DC

## 2017-05-02 MED ORDER — METFORMIN HCL ER 500 MG PO TB24: 1000.0000 mg | ORAL_TABLET | Freq: Two times a day (BID) | ORAL | 1 refills | Status: DC

## 2017-05-02 MED ORDER — DULAGLUTIDE 0.75 MG/0.5ML ~~LOC~~ SOAJ: 0.7500 mg | SUBCUTANEOUS | 0 refills | Status: DC

## 2017-05-02 NOTE — Assessment & Plan Note (Signed)
Diabetes longstanding diagnosed currently uncontrolled. Patient denies hypoglycemic events and is able to verbalize appropriate hypoglycemia management plan. Patient reports adherence with medication. Control is suboptimal due to diet, and sedentary lifestyle.  Patient has stopped Invokana due to rash which has started to resolve upon discontinuation.  Following discussion and approval by Dr Adriana Simasook, the following medication changes were made:  -Started Trulicity (dulaglutide) to 0.75 mg x 2 weeks then increase to 1.5 mg weekly.  -Increase to metformin XR 1000 mg and 500 mg every evening x 1 week then increase to 1000 mg BID -Discussed mechanism, efficacy and potential side effects of metformin and GLP1 agonist.  -Stopped Invokana due to rash -Discussed low carbohydrate diet and increasing exercise.   -Counseled on signs/symptoms/treatment of hypoglycemia -Next A1C anticipated 07/2017.

## 2017-05-02 NOTE — Patient Instructions (Addendum)
Increase metformin XR to 1000 mg every morning and 500 mg every evening for 1 week then increase to 1000 mg twice daily (take with food)  Start Trulicity 0.75 mg weekly for 2 weeks then increase to 1.5 mg weekly   Please call clinic if you experience hypoglycemia or have side effects with the Trulicity   Followup with Hazle NordmannKelsy Combs, PharmD in 3-4 weeks

## 2017-05-02 NOTE — Progress Notes (Signed)
Care was provided under my supervision. I agree with the management as indicated in the note.  Aarin Sparkman DO  

## 2017-05-02 NOTE — Assessment & Plan Note (Signed)
Hypertension longstanding diagnosed currently controlled.  Patient reports adherence with medication. Continue current medications. 

## 2017-05-02 NOTE — Progress Notes (Addendum)
S:    Chief Complaint  Patient presents with  . Medication Management    Diabetes   Patient arrives in good spirits ambulating without assistance.  Presents for diabetes evaluation, education, and management at the request of Dr Adriana Simas. Patient was referred on 04/22/17 (lab note).  Patient was last seen by Primary Care Provider on 04/22/17.   Today patient states he has stopped Invokana due to a rash on chest arms and back.  He states this has started to resolve since discontinuation after 3 days. He states his elevated A1C may be due to being "slack" on his diet and exercise over the winter.  His metformin was decreased from 2000 mg/day previously to 1000 mg/day due to improved CBG control.   Patient reports Diabetes was diagnosed in since 31 years old (7 years).   Family/Social History: Father (DM1), Mother (DM2), Grandfather (Stroke/Heart Attack)  Patient reports adherence with medications.  Current diabetes medications include: Current hypertension medications include:   Patient denies hypoglycemic events. Reports proper treatment knowledge  Patient reported dietary habits: Eats 3 meals/day.  Was "slack" on his diet with high carbohydrates and glucose rich foods.   Breakfast:breakfast casserol with sausage, eggs, cheese Lunch:hamburger patty or chicken breast or salmon with vegetables (green beans, brussel sprouts, peas, carrots, corn).  Sometimes a sandwich Dinner:Most carbohydrates - Frozen pizza, chicken sandwich, whole wheat pasta with vegetable Snacks:potato salad, pasta salad, chips, pretzels, peanuts, pecans Drinks:diet soda, water, coffee with splenda and non-dairy creamer  Patient reported exercise habits: Is about to start mountain biking and roller bladeing soon.  Currently not exercising. Has decreased exercise with winter.     Patient denies nocturia.  Denies pain/burning upon urination  Patient denies neuropathy. Patient denies visual changes. Patient reports self  foot exams. Denies changes.     Elevated Liver Enzymes - prior to last office visit was drinking a 6 pack a weekend.  He has stopped alcohol until his LFTs can be rechecked.   O:  Physical Exam  Skin: Rash noted.  Vitals reviewed.  Review of Systems  Constitutional: Negative.    Lab Results  Component Value Date   HGBA1C 10.3 (H) 04/22/2017   Vitals:   05/02/17 0814  BP: 132/86  Pulse: 84    Fasting CBG today 201 Post Prandial CBG 171, 189, 141,  Before Dinner: 168,  Fasting 179, 218, 209, 261, 201  A/P: Diabetes longstanding diagnosed currently uncontrolled. Patient denies hypoglycemic events and is able to verbalize appropriate hypoglycemia management plan. Patient reports adherence with medication. Control is suboptimal due to diet, and sedentary lifestyle.  Patient has stopped Invokana due to rash which has started to resolve upon discontinuation.  Following discussion and approval by Dr Adriana Simas, the following medication changes were made:  -Started Trulicity (dulaglutide) to 0.75 mg x 2 weeks then increase to 1.5 mg weekly. Samples provided.  -Increase to metformin XR 1000 mg and 500 mg every evening x 1 week then increase to 1000 mg BID -Discussed mechanism, efficacy and potential side effects of metformin and GLP1 agonist.  -Stopped Invokana due to rash -Discussed low carbohydrate diet and increasing exercise.   -Counseled on signs/symptoms/treatment of hypoglycemia -Next A1C anticipated 07/2017.    Hyperlipidemia ASCVD risk greater than 7.5% and LDL currently at goal. TG currently elevated at 595 and LFTs elevated at last visit -Patient not on ASA given age -Continued atorvastatin 40 mg daily -Consider repeat Lipid panel in future to reassess TG.  Will not treat at  this time as elevation likely due to elevated A1C -Repeat LFTs pending in 2 weeks likely due to EtOH intake.  Consider risk vs benefit of statin at this time  Hypertension longstanding diagnosed currently  controlled.  Patient reports adherence with medication. Continue current medications.   Written patient instructions provided.  Total time in face to face counseling 50 minutes.   Follow up in Pharmacist Clinic Visit in 2 weeks.    Patient was seen with Dr Adriana Simasook today in clinic and medication changes were discussed and approved prior to initiation

## 2017-05-02 NOTE — Assessment & Plan Note (Signed)
  Hyperlipidemia ASCVD risk greater than 7.5% and LDL currently at goal. TG currently elevated at 595 and LFTs elevated at last visit -Patient not on ASA given age -Continued atorvastatin 40 mg daily -Consider repeat Lipid panel in future to reassess TG.  Will not treat at this time as elevation likely due to elevated A1C -Repeat LFTs pending in 2 weeks likely due to EtOH intake.  Consider risk vs benefit of statin at this time

## 2017-05-02 NOTE — Addendum Note (Signed)
Addended by: Hazle NordmannOMBS, KELSY E on: 05/02/2017 01:32 PM   Modules accepted: Orders

## 2017-05-12 ENCOUNTER — Other Ambulatory Visit: Payer: Self-pay | Admitting: Family Medicine

## 2017-05-12 ENCOUNTER — Telehealth: Payer: Self-pay | Admitting: Radiology

## 2017-05-12 DIAGNOSIS — R945 Abnormal results of liver function studies: Principal | ICD-10-CM

## 2017-05-12 DIAGNOSIS — R7989 Other specified abnormal findings of blood chemistry: Secondary | ICD-10-CM

## 2017-05-12 NOTE — Telephone Encounter (Signed)
Pt is coming in for labs Monday, please place future orders. Thank you  

## 2017-05-16 ENCOUNTER — Ambulatory Visit (INDEPENDENT_AMBULATORY_CARE_PROVIDER_SITE_OTHER): Payer: 59 | Admitting: Pharmacist

## 2017-05-16 ENCOUNTER — Encounter: Payer: Self-pay | Admitting: Pharmacist

## 2017-05-16 ENCOUNTER — Other Ambulatory Visit (INDEPENDENT_AMBULATORY_CARE_PROVIDER_SITE_OTHER): Payer: 59

## 2017-05-16 DIAGNOSIS — E785 Hyperlipidemia, unspecified: Secondary | ICD-10-CM

## 2017-05-16 DIAGNOSIS — R7989 Other specified abnormal findings of blood chemistry: Secondary | ICD-10-CM

## 2017-05-16 DIAGNOSIS — I10 Essential (primary) hypertension: Secondary | ICD-10-CM | POA: Diagnosis not present

## 2017-05-16 DIAGNOSIS — E119 Type 2 diabetes mellitus without complications: Secondary | ICD-10-CM | POA: Diagnosis not present

## 2017-05-16 DIAGNOSIS — R945 Abnormal results of liver function studies: Principal | ICD-10-CM

## 2017-05-16 LAB — HEPATIC FUNCTION PANEL
ALK PHOS: 46 U/L (ref 39–117)
ALT: 107 U/L — AB (ref 0–53)
AST: 42 U/L — AB (ref 0–37)
Albumin: 4.8 g/dL (ref 3.5–5.2)
Bilirubin, Direct: 0.2 mg/dL (ref 0.0–0.3)
Total Bilirubin: 0.4 mg/dL (ref 0.2–1.2)
Total Protein: 7.5 g/dL (ref 6.0–8.3)

## 2017-05-16 MED ORDER — DULAGLUTIDE 1.5 MG/0.5ML ~~LOC~~ SOAJ: 1.5000 mg | SUBCUTANEOUS | 3 refills | Status: DC

## 2017-05-16 NOTE — Assessment & Plan Note (Signed)
Hyperlipidemia ASCVD risk greater than 7.5% and LDL currently at goal. TG currently elevated at 595 and LFTs elevated at last visit -Patient not on ASA given age -Continued atorvastatin 40mg  daily -Repeat LFTs today likley due to EtOH intake. Consider risk vs benefit of statin at this time.

## 2017-05-16 NOTE — Patient Instructions (Signed)
Thank you for coming in today  Continue metformin XR 1000 mg twice daily and Trulicity 1.5 mg weekly   Followup with Dr Adriana Simasook in August

## 2017-05-16 NOTE — Progress Notes (Signed)
    S:    Chief Complaint  Patient presents with  . Medication Management    Patient arrives in good spirits ambulating without assistance.  Presents for diabetes evaluation, education, and management at the request of Dr Adriana Simasook. Patient was referred on 04/22/17 (lab note).  Patient was last seen by Primary Care Provider on 04/22/17.  Today he reports blood sugars have improved since Trulicity added, diet has also improved.  Reports missing some nighttime doses of metformin, which causes his morning blood sugars to be 180-190 the next morning.    Patients A1c increased from 6.8 in October to 10.3 at the end of April. He contributes this increase to "falling off the wagon".  Trulicity started at last visit, nausea and vomit-tasting burps for the first few doses have now improved. Patient feels that he is doing better, learning more about what spikes his blood sugar when he eats. He reports feeling calmer, breathes better, and focus is improving. His appetite has decreased which he contributes to improving his dietary choices. Increased his Trulicity dose this Sunday with no nausea.  Eats 3 meals per day. Breakfast: 2 eggs, sausage/bacon Lunch: Grilled chicken and creamed spinach, sometimes salad with grilled chicken or broccoli and cottage cheese Dinner: Chinese (steamed vegetables, no rice), [place] (fajitas, no tortilla/rice), Home (salad, green beans, broccoli, ham/grilled chicken Snacks: pecans Drinks: 2 diet sodas per day, water, coffee (non-dairy creamer and Splenda), alcohol 6 pack/weekend O:  Physical Exam  Constitutional: He appears well-developed and well-nourished.  Vitals reviewed.  Review of Systems  Constitutional: Negative.    Lab Results  Component Value Date   HGBA1C 10.3 (H) 04/22/2017   Vitals:   05/16/17 0816  BP: 115/70  Pulse: 90    Home fasting CBG: 191, 121, 141, 188, 126, 147, 137, 153 PM CBGs: 111, 102, 122, 128, 131, 198, 144, 142   A/P: Diabetes  longstanding diagnosed currently uncontrolled but with improved CBGs. Patient denies hypoglycemic events and is able to verbalize appropriate hypoglycemia management plan. Patient reports adherence with medication. Control is suboptimal due to diet/sedentary lifestyle. Following discussion and approval by Dr Adriana Simasook, the following medication changes were made:  -Continue Trulicity 1.5 mg weekly -Continue metformin XR 1000 mg BID -Discussed low carbohydrate diet/increasing exercise -Instructed patient to call if he sees frequent CBGs >200 Next A1C anticipated 07/2017.    Hyperlipidemia ASCVD risk greater than 7.5% and LDL currently at goal. TG currently elevated at 595 and LFTs elevated at last visit -Patient not on ASA given age -Continued atorvastatin 40 mg daily -Repeat LFTs today likley due to EtOH intake.  Consider risk vs benefit of statin at this time.   Hypertension longstanding diagnosed currently controlled.  Patient reports adherence with medication. Continue current medications.  Written patient instructions provided.  Total time in face to face counseling 35 minutes.   Follow up with Dr. Adriana Simasook in August  Patient was seen with Dr Adriana Simasook today in clinic and medication changes were discussed and approved prior to initiation

## 2017-05-16 NOTE — Assessment & Plan Note (Signed)
Diabetes longstanding diagnosed currently uncontrolled but with improved CBGs. Patient denies hypoglycemic events and is able to verbalize appropriate hypoglycemia management plan. Patient reports adherence with medication. Control is suboptimal due to diet/sedentary lifestyle. Following discussion and approval by Dr Adriana Simasook, the following medication changes were made:  -Continue Trulicity 1.5 mg weekly -Continue metformin XR 1000 mg BID -Discussed low carbohydrate diet/increasing exercise -Instructed patient to call if he sees frequent CBGs >200 Next A1C anticipated 07/2017.

## 2017-05-16 NOTE — Assessment & Plan Note (Signed)
Hypertension longstanding diagnosed currently controlled.  Patient reports adherence with medication. Continue current medications. 

## 2017-05-16 NOTE — Progress Notes (Signed)
Care was provided under my supervision. I agree with the management as indicated in the note.  Shevette Bess DO  

## 2017-05-17 ENCOUNTER — Encounter: Payer: Self-pay | Admitting: *Deleted

## 2017-05-19 ENCOUNTER — Other Ambulatory Visit: Payer: Self-pay | Admitting: Family Medicine

## 2017-05-19 DIAGNOSIS — R945 Abnormal results of liver function studies: Principal | ICD-10-CM

## 2017-05-19 DIAGNOSIS — R7989 Other specified abnormal findings of blood chemistry: Secondary | ICD-10-CM

## 2017-05-27 ENCOUNTER — Ambulatory Visit
Admission: RE | Admit: 2017-05-27 | Discharge: 2017-05-27 | Disposition: A | Discharge status: Home / Self Care | Payer: 59 | Source: Ambulatory Visit | Attending: Family Medicine | Admitting: Family Medicine

## 2017-05-27 DIAGNOSIS — K760 Fatty (change of) liver, not elsewhere classified: Secondary | ICD-10-CM | POA: Insufficient documentation

## 2017-05-27 DIAGNOSIS — R7989 Other specified abnormal findings of blood chemistry: Secondary | ICD-10-CM | POA: Diagnosis present

## 2017-05-27 DIAGNOSIS — R945 Abnormal results of liver function studies: Secondary | ICD-10-CM

## 2017-05-30 ENCOUNTER — Telehealth: Payer: Self-pay | Admitting: Family Medicine

## 2017-05-30 NOTE — Telephone Encounter (Signed)
Patient advised of results see imaging for documentation.

## 2017-05-30 NOTE — Telephone Encounter (Signed)
Pt called back returning your call. Thank you! °

## 2017-05-31 ENCOUNTER — Other Ambulatory Visit: Payer: Self-pay | Admitting: Family Medicine

## 2017-05-31 DIAGNOSIS — K760 Fatty (change of) liver, not elsewhere classified: Secondary | ICD-10-CM | POA: Insufficient documentation

## 2017-07-11 ENCOUNTER — Ambulatory Visit (INDEPENDENT_AMBULATORY_CARE_PROVIDER_SITE_OTHER): Payer: 59 | Admitting: Gastroenterology

## 2017-07-11 ENCOUNTER — Encounter: Payer: Self-pay | Admitting: Gastroenterology

## 2017-07-11 ENCOUNTER — Other Ambulatory Visit: Payer: Self-pay

## 2017-07-11 VITALS — BP 136/73 | HR 98 | Ht 72.0 in | Wt 222.0 lb

## 2017-07-11 DIAGNOSIS — K760 Fatty (change of) liver, not elsewhere classified: Secondary | ICD-10-CM

## 2017-07-11 DIAGNOSIS — R748 Abnormal levels of other serum enzymes: Secondary | ICD-10-CM | POA: Diagnosis not present

## 2017-07-11 DIAGNOSIS — F410 Panic disorder [episodic paroxysmal anxiety] without agoraphobia: Secondary | ICD-10-CM | POA: Insufficient documentation

## 2017-07-11 NOTE — Progress Notes (Signed)
Gastroenterology Consultation  Referring Provider:     Coral Spikes, DO Primary Care Physician:  Coral Spikes, DO Primary Gastroenterologist:  Dr. Allen Norris     Reason for Consultation:     Fatty liver   HPI:   Jorge Wright is a 31 y.o. y/o male referred for consultation & management of Fatty liver by Dr. Lacinda Axon, Barnie Del, DO.  This patient comes in today with a history of fatty liver on ultrasound. The patient also drinks approximately a 12 pack of beer a week. The patient states he stopped drinking for approximate 2 months and his liver enzymes did not go down. The patient has had predominantly increased ALT. The patient also reports that he has diabetes and has lost weight since his diabetic medication was switched. The patient also reports that he has had high triglycerides with his last triglycerides over 500. There is no report of any abdominal pain, fevers, chills, nausea or vomiting. There is no history of any high risk activity. The patient went back to drinking after he found out that his liver enzymes did not improve with stopping alcohol.  Past Medical History:  Diagnosis Date  . Chicken pox   . Depression   . Diabetes mellitus without complication ([place])   . History of alcohol abuse   . Hyperlipidemia   . Hypertension     Past Surgical History:  Procedure Laterality Date  . MANDIBLE FRACTURE SURGERY      Prior to Admission medications   Medication Sig Start Date End Date Taking? Authorizing Provider  atorvastatin (LIPITOR) 40 MG tablet Take 1 tablet (40 mg total) by mouth daily. 11/24/16  Yes Cook, Jayce G, DO  blood glucose meter kit and supplies KIT Dispense based on patient and insurance preference. Use up to four times daily as directed. 04/22/17  Yes Cook, Jayce G, DO  busPIRone (BUSPAR) 10 MG tablet Take 1 tablet (10 mg total) by mouth 2 (two) times daily. 11/24/16  Yes Cook, Jayce G, DO  Dulaglutide (TRULICITY) 1.5 PI/9.5JO SOPN Inject 1.5 mg into the skin once a  week. 05/16/17  Yes Cook, Jayce G, DO  escitalopram (LEXAPRO) 20 MG tablet Take 1 tablet (20 mg total) by mouth daily. 04/22/17  Yes Cook, Jayce G, DO  lisinopril (PRINIVIL,ZESTRIL) 10 MG tablet Take 1 tablet (10 mg total) by mouth daily. 04/22/17  Yes Cook, Jayce G, DO  metFORMIN (GLUCOPHAGE-XR) 500 MG 24 hr tablet Take 2 tablets (1,000 mg total) by mouth 2 (two) times daily. 05/02/17  Yes Cook, Franklin, DO  ONE TOUCH ULTRA TEST test strip  04/22/17  Yes [provider]  Jonetta Speak LANCETS 84Z [place]  04/22/17  Yes [provider]  INVOKANA 100 MG TABS tablet  04/22/17   [provider]    Family History  Problem Relation Age of Onset  . Alcohol abuse Mother   . Hypertension Mother   . Heart disease Mother   . Diabetes Mother   . Alcohol abuse Father   . Hypertension Father   . Diabetes Father   . Alcohol abuse Maternal Grandmother   . Heart disease Maternal Grandmother   . Hypertension Maternal Grandmother   . Stroke Paternal Grandfather      Social History  Substance Use Topics  . Smoking status: Former Research scientist (life sciences)  . Smokeless tobacco: Former Systems developer  . Alcohol use 3.6 oz/week    6 Standard drinks or equivalent per week    Allergies as of  07/11/2017 - Review Complete 07/11/2017  Allergen Reaction Noted  . Invokana [canagliflozin] Rash 05/02/2017  . Penicillins Rash 06/11/2016    Review of Systems:    All systems reviewed and negative except where noted in HPI.   Physical Exam:  BP 136/73   Pulse 98   Ht 6' (1.829 m)   Wt 222 lb (100.7 kg)   BMI 30.11 kg/m  No LMP for male patient. Psych:  Alert and cooperative. Normal mood and affect. General:   Alert,  Well-developed, well-nourished, pleasant and cooperative in NAD Head:  Normocephalic and atraumatic. Eyes:  Sclera clear, no icterus.   Conjunctiva pink. Ears:  Normal auditory acuity. Nose:  No deformity, discharge, or lesions. Mouth:  No deformity or lesions,oropharynx pink & moist. Neck:   Supple; no masses or thyromegaly. Lungs:  Respirations even and unlabored.  Clear throughout to auscultation.   No wheezes, crackles, or rhonchi. No acute distress. Heart:  Regular rate and rhythm; no murmurs, clicks, rubs, or gallops. Abdomen:  Normal bowel sounds.  No bruits.  Soft, non-tender and non-distended without masses, hepatosplenomegaly or hernias noted.  No guarding or rebound tenderness.  Negative Carnett sign.   Rectal:  Deferred.  Msk:  Symmetrical without gross deformities.  Good, equal movement & strength bilaterally. Pulses:  Normal pulses noted. Extremities:  No clubbing or edema.  No cyanosis. Neurologic:  Alert and oriented x3;  grossly normal neurologically. Skin:  Intact without significant lesions or rashes.  No jaundice. Lymph Nodes:  No significant cervical adenopathy. Psych:  Alert and cooperative. Normal mood and affect.  Imaging Studies: No results found.  Assessment and Plan:   Jorge Wright is a 31 y.o. y/o male who comes in today with a history of abnormal liver enzymes. The patient's abnormal liver enzymes may be due to his fatty liver disease found on ultrasound. The patient did stop drinking for 2 months and his AST came down by approximate 20 point but his ALT remained stable. The patient will have blood sent off for other possibilities of abnormal liver enzymes. The patient will be notified of the results and has told that although his liver enzymes do not return to normal when he stopped drinking a may be contributing to his fatty liver. The patient will continue to try and keep his diabetes and his cholesterol under control. The patient will also try to lose weight although he is not obese. The patient has been explained the plan and will also be notified with the results.  Lucilla Lame, MD. Marval Regal   Note: This dictation was prepared with Dragon dictation along with smaller phrase technology. Any transcriptional errors that result from this process are  unintentional.

## 2017-07-15 ENCOUNTER — Telehealth: Payer: Self-pay

## 2017-07-15 NOTE — Telephone Encounter (Signed)
Left vm with lab results. 

## 2017-07-15 NOTE — Telephone Encounter (Signed)
-----   Message from Midge Miniumarren Wohl, MD sent at 07/14/2017  6:13 AM EDT ----- Let the Patient know that his liver enzymes have come back to normal And the rest of his blood tests so far that have come back are also normal.

## 2017-07-16 LAB — IRON AND TIBC
Iron Saturation: 16 % (ref 15–55)
Iron: 48 ug/dL (ref 38–169)
TIBC: 294 ug/dL (ref 250–450)
UIBC: 246 ug/dL (ref 111–343)

## 2017-07-16 LAB — ALPHA-1-ANTITRYPSIN: A1 ANTITRYPSIN: 148 mg/dL (ref 90–200)

## 2017-07-16 LAB — HEPATIC FUNCTION PANEL
ALT: 30 IU/L (ref 0–44)
AST: 17 IU/L (ref 0–40)
Albumin: 4.7 g/dL (ref 3.5–5.5)
Alkaline Phosphatase: 85 IU/L (ref 39–117)
BILIRUBIN, DIRECT: 0.1 mg/dL (ref 0.00–0.40)
Bilirubin Total: 0.2 mg/dL (ref 0.0–1.2)
TOTAL PROTEIN: 7.1 g/dL (ref 6.0–8.5)

## 2017-07-16 LAB — MITOCHONDRIAL ANTIBODIES: MITOCHONDRIAL AB: 2.9 U (ref 0.0–20.0)

## 2017-07-16 LAB — ANTI-SMOOTH MUSCLE ANTIBODY, IGG: SMOOTH MUSCLE AB: 7 U (ref 0–19)

## 2017-07-16 LAB — CERULOPLASMIN: CERULOPLASMIN: 27 mg/dL (ref 16.0–31.0)

## 2017-07-16 LAB — HEPATITIS PANEL, ACUTE
HEP A IGM: NEGATIVE
Hep B C IgM: NEGATIVE
Hepatitis B Surface Ag: NEGATIVE

## 2017-07-16 LAB — FERRITIN: Ferritin: 185 ng/mL (ref 30–400)

## 2017-07-16 LAB — ANA: Anti Nuclear Antibody(ANA): NEGATIVE

## 2017-07-16 LAB — IGG, IGA, IGM
IGA/IMMUNOGLOBULIN A, SERUM: 109 mg/dL (ref 90–386)
IGG (IMMUNOGLOBIN G), SERUM: 937 mg/dL (ref 700–1600)
IgM (Immunoglobulin M), Srm: 89 mg/dL (ref 20–172)

## 2017-07-18 ENCOUNTER — Encounter: Payer: Self-pay | Admitting: Family Medicine

## 2017-07-18 ENCOUNTER — Ambulatory Visit (INDEPENDENT_AMBULATORY_CARE_PROVIDER_SITE_OTHER): Payer: 59 | Admitting: Family Medicine

## 2017-07-18 VITALS — BP 110/74 | HR 93 | Temp 98.9°F | Resp 12 | Wt 220.5 lb

## 2017-07-18 DIAGNOSIS — E119 Type 2 diabetes mellitus without complications: Secondary | ICD-10-CM

## 2017-07-18 NOTE — Assessment & Plan Note (Signed)
Established problem, improving. Not yet due for A1C (later this week). Continue Trulicity and Metformin. Followup in 3 months.

## 2017-07-18 NOTE — Patient Instructions (Signed)
A1C on or after 7/27.  Follow up in 3 months.  Take care  Dr. Adriana Simasook

## 2017-07-18 NOTE — Progress Notes (Signed)
Subjective:  Patient ID: Jorge Wright, male    DOB: 09-18-1986  Age: 31 y.o. MRN: [id number]  CC: [id number]  HPI:  31 year old male with hypertension, DM 2, hepatic steatosis, hyperlipidemia presents for follow up regarding diabetes.  DM-2  Sugars markedly improved. Ranging 100-120 typically. 130 this am.  He is doing well on metformin and Trulicity. He reports that he initially had a lot of nausea associated with trulicity. This is now resolved.  No hypoglycemia.  Endorses compliance with his medications.  No other complaints or concerns at this time.  Social Hx   Social History   Social History  . Marital status: Married    Spouse name: N/A  . Number of children: N/A  . Years of education: N/A   Social History Main Topics  . Smoking status: Former Games developer  . Smokeless tobacco: Former Neurosurgeon  . Alcohol use 3.6 oz/week    6 Standard drinks or equivalent per week  . Drug use: Yes    Types: Marijuana  . Sexual activity: Not Asked   Other Topics Concern  . None   Social History Narrative  . None    Review of Systems  Constitutional: Negative.   Gastrointestinal: Negative.    Objective:  BP 110/74 (BP Location: Left Arm, Patient Position: Sitting, Cuff Size: Large)   Pulse 93   Temp 98.9 F (37.2 C) (Oral)   Resp 12   Wt 220 lb 8 oz (100 kg)   SpO2 97%   BMI 29.91 kg/m   BP/Weight 07/18/2017 07/11/2017 05/16/2017  Systolic BP 110 136 115  Diastolic BP 74 73 70  Wt. (Lbs) 220.5 222 228  BMI 29.91 30.11 31.18    Physical Exam  Constitutional: He is oriented to person, place, and time. He appears well-developed. No distress.  Cardiovascular: Normal rate and regular rhythm.   No murmur heard. Pulmonary/Chest: Effort normal and breath sounds normal. He has no wheezes. He has no rales.  Abdominal: Soft. He exhibits no distension. There is no tenderness. There is no rebound and no guarding.  Neurological: He is alert and oriented to person, place, and time.    Psychiatric: He has a normal mood and affect.  Vitals reviewed.   Lab Results  Component Value Date   WBC 7.3 04/22/2017   HGB 14.0 04/22/2017   HCT 41.5 04/22/2017   PLT 207.0 04/22/2017   GLUCOSE 277 (H) 04/22/2017   CHOL 185 04/22/2017   TRIG (H) 04/22/2017    595.0 Triglyceride is over 400; calculations on Lipids are invalid.   HDL 29.90 (L) 04/22/2017   LDLDIRECT 58.0 04/22/2017   ALT 30 07/11/2017   AST 17 07/11/2017   NA 134 (L) 04/22/2017   K 4.2 04/22/2017   CL 101 04/22/2017   CREATININE 1.00 04/22/2017   BUN 15 04/22/2017   CO2 25 04/22/2017   HGBA1C 10.3 (H) 04/22/2017    Assessment & Plan:   Problem List Items Addressed This Visit      Endocrine   DM type 2 (diabetes mellitus, type 2) (HCC) - Primary    Established problem, improving. Not yet due for A1C (later this week). Continue Trulicity and Metformin. Followup in 3 months.       Relevant Orders   POCT HgB A1C     Follow-up: 3 months  Bijon Mineer Adriana Simas DO [facility]

## 2017-07-29 ENCOUNTER — Other Ambulatory Visit: Payer: 59

## 2017-08-01 ENCOUNTER — Encounter: Payer: Self-pay | Admitting: Family Medicine

## 2017-08-02 ENCOUNTER — Telehealth: Payer: Self-pay

## 2017-08-02 NOTE — Telephone Encounter (Signed)
-----   Message from Midge Miniumarren Wohl, MD sent at 08/01/2017  6:54 AM EDT ----- Let the patient know that his liver enzymes have come back to normal.

## 2017-08-02 NOTE — Telephone Encounter (Signed)
Pt notified of lab results

## 2017-08-21 ENCOUNTER — Other Ambulatory Visit: Payer: Self-pay | Admitting: Family Medicine

## 2017-10-18 ENCOUNTER — Ambulatory Visit: Payer: 59 | Admitting: Family Medicine

## 2017-11-01 ENCOUNTER — Other Ambulatory Visit: Payer: Self-pay | Admitting: Family Medicine

## 2017-11-01 NOTE — Telephone Encounter (Signed)
Refill given.  Needs visit to establish care.

## 2017-11-01 NOTE — Telephone Encounter (Signed)
Last lipid was  4/18 and last OV 7/18 can I fill and then have patient establish ?

## 2018-01-12 ENCOUNTER — Other Ambulatory Visit: Payer: Self-pay | Admitting: Family Medicine

## 2018-01-12 DIAGNOSIS — E119 Type 2 diabetes mellitus without complications: Secondary | ICD-10-CM

## 2018-02-10 ENCOUNTER — Encounter: Payer: Self-pay | Admitting: Family

## 2018-02-10 DIAGNOSIS — E119 Type 2 diabetes mellitus without complications: Secondary | ICD-10-CM

## 2018-02-13 NOTE — Telephone Encounter (Signed)
Last office visit 01/12/18 Next office visit 03/12/18

## 2018-02-14 MED ORDER — METFORMIN HCL ER 500 MG PO TB24: 1000.0000 mg | ORAL_TABLET | Freq: Two times a day (BID) | ORAL | 1 refills | Status: DC

## 2018-02-24 ENCOUNTER — Ambulatory Visit (INDEPENDENT_AMBULATORY_CARE_PROVIDER_SITE_OTHER): Payer: 59 | Admitting: Family

## 2018-02-24 ENCOUNTER — Encounter: Payer: Self-pay | Admitting: Family

## 2018-02-24 VITALS — BP 118/72 | HR 109 | Temp 98.3°F | Resp 16 | Wt 230.4 lb

## 2018-02-24 DIAGNOSIS — F32A Depression, unspecified: Secondary | ICD-10-CM

## 2018-02-24 DIAGNOSIS — F329 Major depressive disorder, single episode, unspecified: Secondary | ICD-10-CM

## 2018-02-24 DIAGNOSIS — R002 Palpitations: Secondary | ICD-10-CM

## 2018-02-24 DIAGNOSIS — F419 Anxiety disorder, unspecified: Secondary | ICD-10-CM

## 2018-02-24 DIAGNOSIS — I10 Essential (primary) hypertension: Secondary | ICD-10-CM

## 2018-02-24 DIAGNOSIS — E119 Type 2 diabetes mellitus without complications: Secondary | ICD-10-CM | POA: Diagnosis not present

## 2018-02-24 MED ORDER — METFORMIN HCL ER 500 MG PO TB24: 1000.0000 mg | ORAL_TABLET | Freq: Two times a day (BID) | ORAL | 1 refills | Status: DC

## 2018-02-24 NOTE — Assessment & Plan Note (Signed)
Doing well on current regimen. Will continue 

## 2018-02-24 NOTE — Assessment & Plan Note (Signed)
Pending A1c.  Suspect at goal.

## 2018-02-24 NOTE — Assessment & Plan Note (Addendum)
Atypical presentation. Suspect anxiety, possibly acid reflux contributory.  [facility]  EKG today does not show any acute ischemic changes however there are some nonspecific changes, T wave flattening.  Based on patient's family history of cardiac disease, personal comorbidities, patient and I jointly agreed cardiac evaluation  appropriate next step.  Referral has been placed.

## 2018-02-24 NOTE — Progress Notes (Signed)
Subjective:    Patient ID: Jorge Wright, male    DOB: Aug 03, 1986, 32 y.o.   MRN: [id number]  CC: Jorge Wright is a 32 y.o. male who presents today for follow up.   HPI: DM- FBG 95-120. Compliant with medications.   Depression and anxiety- controlled on current regimen. No SI, HI.   Notes 'string being pulled in chest' , intermittent. No pressure or tightness. Tends to notice in the evenings. A/w rest. When excercising, notices that happens last. 'Not pain.' Unsure if palpitations.  Denies exertional chest pain or pressure, numbness or tingling radiating to left arm or jaw, dizziness, frequent headaches, changes in vision, or shortness of breath.   Nonsmoker.  Drinks 6 pack of beer on weekends   Has seen Jorge Wright in the past for eleavted liver enzymes.   HISTORY:  Past Medical History:  Diagnosis Date  . Chicken pox   . Depression   . Diabetes mellitus without complication ([place])   . History of alcohol abuse   . Hyperlipidemia   . Hypertension    Past Surgical History:  Procedure Laterality Date  . MANDIBLE FRACTURE SURGERY     Family History  Problem Relation Age of Onset  . Alcohol abuse Mother   . Hypertension Mother   . Heart disease Mother   . Diabetes Mother   . Heart attack Mother 34  . Alcohol abuse Father   . Hypertension Father   . Diabetes Father   . Alcohol abuse Maternal Grandmother   . Heart disease Maternal Grandmother   . Hypertension Maternal Grandmother   . Stroke Paternal Grandfather     Allergies: Invokana [canagliflozin] and Penicillins Current Outpatient Medications on File Prior to Visit  Medication Sig Dispense Refill  . atorvastatin (LIPITOR) 40 MG tablet TAKE 1 TABLET DAILY 90 tablet 3  . blood glucose meter kit and supplies KIT Dispense based on patient and insurance preference. Use up to four times daily as directed. 1 each 0  . busPIRone (BUSPAR) 10 MG tablet TAKE 1 TABLET TWICE A DAY 180 tablet 2  . Dulaglutide (TRULICITY)  1.5 XJ/1.5ZM SOPN Inject 1.5 mg into the skin once a week. 12 pen 3  . escitalopram (LEXAPRO) 20 MG tablet Take 1 tablet (20 mg total) by mouth daily. 90 tablet 2  . lisinopril (PRINIVIL,ZESTRIL) 10 MG tablet Take 1 tablet (10 mg total) by mouth daily. 90 tablet 3  . ONE TOUCH ULTRA TEST test strip     . ONETOUCH DELICA LANCETS 08Y MISC      No current facility-administered medications on file prior to visit.     Social History   Tobacco Use  . Smoking status: Former Research scientist (life sciences)  . Smokeless tobacco: Former Network engineer Use Topics  . Alcohol use: Yes    Alcohol/week: 3.6 oz    Types: 6 Standard drinks or equivalent per week  . Drug use: Yes    Types: Marijuana    Review of Systems  Constitutional: Negative for chills and fever.  HENT: Negative for congestion, ear pain, rhinorrhea, sinus pressure and sore throat.   Respiratory: Negative for cough, shortness of breath and wheezing.   Cardiovascular: Positive for chest pain and palpitations.  Gastrointestinal: Negative for diarrhea, nausea and vomiting.  Genitourinary: Negative for dysuria.  Musculoskeletal: Negative for myalgias.  Skin: Negative for rash.  Neurological: Negative for headaches.  Hematological: Negative for adenopathy.  Psychiatric/Behavioral: Negative for suicidal ideas.      Objective:  BP 118/72 (BP Location: Left Arm, Patient Position: Sitting, Cuff Size: Large)   Pulse (!) 109   Temp 98.3 F (36.8 C) (Oral)   Resp 16   Wt 230 lb 6 oz (104.5 kg)   SpO2 98%   BMI 31.24 kg/m  BP Readings from Last 3 Encounters:  02/24/18 118/72  07/18/17 110/74  07/11/17 136/73   Wt Readings from Last 3 Encounters:  02/24/18 230 lb 6 oz (104.5 kg)  07/18/17 220 lb 8 oz (100 kg)  07/11/17 222 lb (100.7 kg)    Physical Exam  Constitutional: He appears well-developed and well-nourished.  Cardiovascular: Regular rhythm and normal heart sounds.  Pulmonary/Chest: Effort normal and breath sounds normal. No  respiratory distress. He has no wheezes. He has no rhonchi. He has no rales.  Neurological: He is alert.  Skin: Skin is warm and dry.  Psychiatric: He has a normal mood and affect. His speech is normal and behavior is normal.  Vitals reviewed.      Assessment & Plan:   Problem List Items Addressed This Visit      Cardiovascular and Mediastinum   HTN (hypertension)    At goal.  Continue current regimen        Endocrine   DM type 2 (diabetes mellitus, type 2) ([place])    Pending A1c.  Suspect at goal.      Relevant Medications   metFORMIN (GLUCOPHAGE-XR) 500 MG 24 hr tablet     Other   Anxiety and depression    Doing well on current regimen.  Will continue      Palpitations - Primary    Atypical presentation. Suspect anxiety, possibly acid reflux contributory.  Unable to see prior EKG from [facility].  EKG today does not show any acute ischemic changes however there are some nonspecific changes, T wave flattening.  Based on patient's family history of cardiac disease, personal comorbidities, patient and I jointly agreed cardiac evaluation  appropriate next step.  Referral has been placed.      Relevant Medications   metFORMIN (GLUCOPHAGE-XR) 500 MG 24 hr tablet   Other Relevant Orders   CBC with Differential/Platelet   Comprehensive metabolic panel   Hemoglobin A1c   Lipid panel   TSH   VITAMIN D 25 Hydroxy (Vit-D Deficiency, Fractures)   Magnesium   EKG 12-Lead (Completed)   Ambulatory referral to Cardiology       I am having Jorge Wright maintain his lisinopril, escitalopram, blood glucose meter kit and supplies, Dulaglutide, ONE TOUCH ULTRA TEST, ONETOUCH DELICA LANCETS 24E, busPIRone, atorvastatin, and metFORMIN.   Meds ordered this encounter  Medications  . metFORMIN (GLUCOPHAGE-XR) 500 MG 24 hr tablet    Sig: Take 2 tablets (1,000 mg total) by mouth 2 (two) times daily.    Dispense:  360 tablet    Refill:  1    Return precautions given.   Risks,  benefits, and alternatives of the medications and treatment plan prescribed today were discussed, and patient expressed understanding.   Education regarding symptom management and diagnosis given to patient on AVS.  Continue to follow with McLean-Scocuzza, Nino Glow, MD for routine health maintenance.   Jorge Wright and I agreed with plan.   Mable Paris, FNP

## 2018-02-24 NOTE — Patient Instructions (Addendum)
Ensure you have eye exam.   Labs when fasting  Notice when have chest pain if related to acid reflux or anxiety  May trial zantac twice a day before meals  Follow up 6 months, sooner if chest symptom recurs.   Please let me know if you would like referral to cardiology

## 2018-02-24 NOTE — Assessment & Plan Note (Signed)
At goal. Continue current regimen. 

## 2018-03-10 ENCOUNTER — Other Ambulatory Visit: Payer: 59

## 2018-03-24 ENCOUNTER — Other Ambulatory Visit (INDEPENDENT_AMBULATORY_CARE_PROVIDER_SITE_OTHER): Payer: 59

## 2018-03-24 DIAGNOSIS — R002 Palpitations: Secondary | ICD-10-CM

## 2018-03-24 LAB — CBC WITH DIFFERENTIAL/PLATELET
BASOS PCT: 0.3 % (ref 0.0–3.0)
Basophils Absolute: 0 10*3/uL (ref 0.0–0.1)
EOS PCT: 0.5 % (ref 0.0–5.0)
Eosinophils Absolute: 0.1 10*3/uL (ref 0.0–0.7)
HEMATOCRIT: 39.3 % (ref 39.0–52.0)
HEMOGLOBIN: 13 g/dL (ref 13.0–17.0)
LYMPHS PCT: 18.6 % (ref 12.0–46.0)
Lymphs Abs: 2.2 10*3/uL (ref 0.7–4.0)
MCHC: 33.1 g/dL (ref 30.0–36.0)
MCV: 86 fl (ref 78.0–100.0)
MONO ABS: 0.9 10*3/uL (ref 0.1–1.0)
MONOS PCT: 7.6 % (ref 3.0–12.0)
Neutro Abs: 8.8 10*3/uL — ABNORMAL HIGH (ref 1.4–7.7)
Neutrophils Relative %: 73 % (ref 43.0–77.0)
Platelets: 275 10*3/uL (ref 150.0–400.0)
RBC: 4.57 Mil/uL (ref 4.22–5.81)
RDW: 14.7 % (ref 11.5–15.5)
WBC: 12 10*3/uL — AB (ref 4.0–10.5)

## 2018-03-24 LAB — COMPREHENSIVE METABOLIC PANEL
ALBUMIN: 4.3 g/dL (ref 3.5–5.2)
ALT: 29 U/L (ref 0–53)
AST: 14 U/L (ref 0–37)
Alkaline Phosphatase: 69 U/L (ref 39–117)
BUN: 14 mg/dL (ref 6–23)
CHLORIDE: 104 meq/L (ref 96–112)
CO2: 25 mEq/L (ref 19–32)
Calcium: 9.4 mg/dL (ref 8.4–10.5)
Creatinine, Ser: 0.92 mg/dL (ref 0.40–1.50)
GFR: 100.75 mL/min (ref >60.00)
Glucose, Bld: 98 mg/dL (ref 70–99)
POTASSIUM: 4.7 meq/L (ref 3.5–5.1)
SODIUM: 139 meq/L (ref 135–145)
Total Bilirubin: 0.5 mg/dL (ref 0.2–1.2)
Total Protein: 7.2 g/dL (ref 6.0–8.3)

## 2018-03-24 LAB — LIPID PANEL
CHOL/HDL RATIO: 3
Cholesterol: 120 mg/dL (ref 0–200)
HDL: 37.7 mg/dL — ABNORMAL LOW (ref >39.00)
LDL CALC: 59 mg/dL (ref 0–99)
NonHDL: 82.75
TRIGLYCERIDES: 118 mg/dL (ref 0.0–149.0)
VLDL: 23.6 mg/dL (ref 0.0–40.0)

## 2018-03-24 LAB — HEMOGLOBIN A1C: Hgb A1c MFr Bld: 6.1 % (ref 4.6–6.5)

## 2018-03-24 LAB — TSH: TSH: 1.53 u[IU]/mL (ref 0.35–4.50)

## 2018-03-24 LAB — VITAMIN D 25 HYDROXY (VIT D DEFICIENCY, FRACTURES): VITD: 16.01 ng/mL — ABNORMAL LOW (ref 30.00–100.00)

## 2018-03-24 LAB — MAGNESIUM: Magnesium: 1.7 mg/dL (ref 1.5–2.5)

## 2018-03-30 ENCOUNTER — Other Ambulatory Visit: Payer: Self-pay | Admitting: Family Medicine

## 2018-03-30 DIAGNOSIS — E119 Type 2 diabetes mellitus without complications: Secondary | ICD-10-CM

## 2018-04-07 ENCOUNTER — Other Ambulatory Visit: Payer: Self-pay | Admitting: Family Medicine

## 2018-04-07 NOTE — Telephone Encounter (Signed)
Patient saw NP on 03/04/18 for an acute problem, has an establish care appointment with you on September 2019?

## 2018-04-14 NOTE — Progress Notes (Deleted)
Cardiology Office Note  Date:  04/14/2018   ID:  Jorge Wright, DOB 20-Dec-1986, MRN [id number]  PCP:  McLean-Scocuzza, Nino Glow, MD   No chief complaint on file.   HPI:  Jorge Wright is a 32 y.o. male DM- FBG 95-120. Depression and anxiety-  hepatic steatosis,  Former smoker Referred by Margarette Arnette for palpitations    'string being pulled in chest' , intermittent. No pressure or tightness.  Tends to notice in the evenings.   When excercising, notices that happens last.  'Not pain.' Unsure if palpitations.   Denies exertional chest pain or pressure,  Nonsmoker.  Drinks 6 pack of beer on weekends   Has seen Dr Allen Norris in the past for eleavted liver enzymes.  PMH:   has a past medical history of Chicken pox, Depression, Diabetes mellitus without complication ([place]), History of alcohol abuse, Hyperlipidemia, and Hypertension.  PSH:    Past Surgical History:  Procedure Laterality Date  . MANDIBLE FRACTURE SURGERY      Current Outpatient Medications  Medication Sig Dispense Refill  . atorvastatin (LIPITOR) 40 MG tablet TAKE 1 TABLET DAILY 90 tablet 3  . blood glucose meter kit and supplies KIT Dispense based on patient and insurance preference. Use up to four times daily as directed. 1 each 0  . busPIRone (BUSPAR) 10 MG tablet TAKE 1 TABLET TWICE A DAY 180 tablet 2  . escitalopram (LEXAPRO) 20 MG tablet TAKE 1 TABLET DAILY 30 tablet 11  . lisinopril (PRINIVIL,ZESTRIL) 10 MG tablet Take 1 tablet (10 mg total) by mouth daily. 90 tablet 3  . metFORMIN (GLUCOPHAGE-XR) 500 MG 24 hr tablet Take 2 tablets (1,000 mg total) by mouth 2 (two) times daily. 360 tablet 1  . ONE TOUCH ULTRA TEST test strip     . ONETOUCH DELICA LANCETS 79T MISC     . TRULICITY 1.5 JQ/3.0SP SOPN INJECT 1.5 MG UNDER THE SKIN ONCE A WEEK 6 mL 3   No current facility-administered medications for this visit.      Allergies:   Invokana [canagliflozin] and Penicillins   Social History:   The patient  reports that he has quit smoking. He has quit using smokeless tobacco. He reports that he drinks about 3.6 oz of alcohol per week. He reports that he has current or past drug history. Drug: Marijuana.   Family History:   family history includes Alcohol abuse in his father, maternal grandmother, and mother; Diabetes in his father and mother; Heart attack (age of onset: 4) in his mother; Heart disease in his maternal grandmother and mother; Hypertension in his father, maternal grandmother, and mother; Stroke in his paternal grandfather.    Review of Systems: Review of Systems  Constitutional: Negative.   Respiratory: Negative.   Cardiovascular: Positive for chest pain.  Gastrointestinal: Negative.   Musculoskeletal: Negative.   Neurological: Negative.   Psychiatric/Behavioral: Negative.   All other systems reviewed and are negative.    PHYSICAL EXAM: VS:  There were no vitals taken for this visit. , BMI There is no height or weight on file to calculate BMI. GEN: Well nourished, well developed, in no acute distress HEENT: normal Neck: no JVD, carotid bruits, or masses Cardiac: RRR; no murmurs, rubs, or gallops,no edema  Respiratory:  clear to auscultation bilaterally, normal work of breathing GI: soft, nontender, nondistended, + BS MS: no deformity or atrophy Skin: warm and dry, no rash Neuro:  Strength and sensation are intact Psych: euthymic mood, full affect  Recent Labs: 03/24/2018: ALT 29; BUN 14; Creatinine, Ser 0.92; Hemoglobin 13.0; Magnesium 1.7; Platelets 275.0; Potassium 4.7; Sodium 139; TSH 1.53    Lipid Panel Lab Results  Component Value Date   CHOL 120 03/24/2018   HDL 37.70 (L) 03/24/2018   LDLCALC 59 03/24/2018   TRIG 118.0 03/24/2018      Wt Readings from Last 3 Encounters:  02/24/18 230 lb 6 oz (104.5 kg)  07/18/17 220 lb 8 oz (100 kg)  07/11/17 222 lb (100.7 kg)       ASSESSMENT AND PLAN:  No diagnosis found.   Disposition:    F/U  6 months  No orders of the defined types were placed in this encounter.    Signed, Esmond Plants, M.D., Ph.D. 04/14/2018  [place], [place]

## 2018-04-17 ENCOUNTER — Ambulatory Visit: Payer: 59 | Admitting: Cardiovascular Disease

## 2018-04-21 ENCOUNTER — Ambulatory Visit: Payer: Managed Care, Other (non HMO) | Admitting: Family Medicine

## 2018-04-25 NOTE — Progress Notes (Signed)
Cardiology Office Note  Date:  04/26/2018   ID:  Jorge Wright, DOB [birth year], MRN [id number]  PCP:  McLean-Scocuzza, Nino Glow, MD   Chief Complaint  Patient presents with  . Other    Referred by Dr. Sanda Klein for Palpitations. Patient c/o some chest pain and SOB. Meds reviewed verbally with patient.     HPI:  Jorge Wright is a 32 y.o. male DM,  HBA1c 6.1 Depression and anxiety hepatic steatosis,  Former smoker, quit early 20s Referred by Margarette Arnette for palpitations, left chest pain   Reports being active at baseline, no chest pain with activity Likes to go mountain biking, does less when he is in school, more in the summer No limitations on his activity  Occasionally at nighttime when he is lying in bed feels a pulling in his left upper chest near his shoulder  Describes it as a  'string being pulled in chest'  No pressure or tightness.  Unable to exclude extra beats or palpitations  Drinks 6 pack of beer on weekends Symptoms unrelated to drinking  Has seen Dr Allen Norris in the past for elevated liver enzymes  EKG personally reviewed by myself on todays visit Shows normal sinus rhythm with rate 82 bpm no significant ST or T wave changes  PMH:   has a past medical history of Chicken pox, Depression, Diabetes mellitus without complication ([place]), History of alcohol abuse, Hyperlipidemia, and Hypertension.  PSH:    Past Surgical History:  Procedure Laterality Date  . MANDIBLE FRACTURE SURGERY      Current Outpatient Medications  Medication Sig Dispense Refill  . atorvastatin (LIPITOR) 40 MG tablet TAKE 1 TABLET DAILY 90 tablet 3  . blood glucose meter kit and supplies KIT Dispense based on patient and insurance preference. Use up to four times daily as directed. 1 each 0  . busPIRone (BUSPAR) 10 MG tablet TAKE 1 TABLET TWICE A DAY 180 tablet 2  . escitalopram (LEXAPRO) 20 MG tablet TAKE 1 TABLET DAILY 30 tablet 11  . lisinopril (PRINIVIL,ZESTRIL) 10 MG tablet  Take 1 tablet (10 mg total) by mouth daily. 90 tablet 3  . metFORMIN (GLUCOPHAGE-XR) 500 MG 24 hr tablet Take 2 tablets (1,000 mg total) by mouth 2 (two) times daily. 360 tablet 1  . ONE TOUCH ULTRA TEST test strip     . ONETOUCH DELICA LANCETS 01X MISC     . TRULICITY 1.5 BL/3.9QZ SOPN INJECT 1.5 MG UNDER THE SKIN ONCE A WEEK 6 mL 3   No current facility-administered medications for this visit.      Allergies:   Invokana [canagliflozin] and Penicillins   Social History:  The patient  reports that he has quit smoking. He has quit using smokeless tobacco. He reports that he drinks about 3.6 oz of alcohol per week. He reports that he has current or past drug history. Drug: Marijuana.   Family History:   family history includes Alcohol abuse in his father, maternal grandmother, and mother; Diabetes in his father and mother; Heart attack (age of onset: 31) in his mother; Heart disease in his maternal grandmother and mother; Hypertension in his father, maternal grandmother, and mother; Stroke in his paternal grandfather.    Review of Systems: Review of Systems  Constitutional: Negative.   Respiratory: Negative.   Cardiovascular: Positive for chest pain and palpitations.  Gastrointestinal: Negative.   Musculoskeletal: Negative.   Neurological: Negative.   Psychiatric/Behavioral: Negative.   All other systems reviewed and are negative.  PHYSICAL EXAM: VS:  BP 130/90 (BP Location: Right Arm, Patient Position: Sitting, Cuff Size: Normal)   Pulse 82   Ht 6' (1.829 m)   Wt 220 lb (99.8 kg)   BMI 29.84 kg/m  , BMI Body mass index is 29.84 kg/m. GEN: Well nourished, well developed, in no acute distress  HEENT: normal  Neck: no JVD, carotid bruits, or masses Cardiac: RRR; no murmurs, rubs, or gallops,no edema  Respiratory:  clear to auscultation bilaterally, normal work of breathing GI: soft, nontender, nondistended, + BS MS: no deformity or atrophy  Skin: warm and dry, no  rash Neuro:  Strength and sensation are intact Psych: euthymic mood, full affect    Recent Labs: 03/24/2018: ALT 29; BUN 14; Creatinine, Ser 0.92; Hemoglobin 13.0; Magnesium 1.7; Platelets 275.0; Potassium 4.7; Sodium 139; TSH 1.53    Lipid Panel Lab Results  Component Value Date   CHOL 120 03/24/2018   HDL 37.70 (L) 03/24/2018   LDLCALC 59 03/24/2018   TRIG 118.0 03/24/2018      Wt Readings from Last 3 Encounters:  04/26/18 220 lb (99.8 kg)  02/24/18 230 lb 6 oz (104.5 kg)  07/18/17 220 lb 8 oz (100 kg)       ASSESSMENT AND PLAN:  Type 2 diabetes mellitus without complication, without long-term current use of insulin (HCC) Hemoglobin A1c well-controlled, recommended he continue to work on his exercise lifestyle modifications low carbohydrate diet  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 No changes to the medications were made.  Palpitations - Plan: EKG 12-Lead Less likely having ectopy, atypical in nature If symptoms do get worse event monitor could be ordered  Left chest pain Atypical, presenting at rest laying in bed but not with heavy activities such as mountain biking We did discuss risk stratification techniques such as CT coronary calcium scoring He will call us if he would like to have this done Currently risk factors are well controlled He does have family history but many of them were in the 47s, longtime smokers  Disposition:   F/U as needed   Total encounter time more than 60 minutes  Greater than 50% was spent in counseling and coordination of care with the patient  Patient was seen in consultation for Mable Paris and will be referred back to her office for ongoing care of the issues  detailed above    Orders Placed This Encounter  Procedures  . EKG 12-Lead     Signed, Esmond Plants, M.D., Ph.D. 04/26/2018  [place], [place]

## 2018-04-26 ENCOUNTER — Ambulatory Visit (INDEPENDENT_AMBULATORY_CARE_PROVIDER_SITE_OTHER): Payer: 59 | Admitting: Cardiovascular Disease

## 2018-04-26 ENCOUNTER — Encounter: Payer: Self-pay | Admitting: Cardiovascular Disease

## 2018-04-26 VITALS — BP 130/90 | HR 82 | Ht 72.0 in | Wt 220.0 lb

## 2018-04-26 DIAGNOSIS — F1021 Alcohol dependence, in remission: Secondary | ICD-10-CM

## 2018-04-26 DIAGNOSIS — R079 Chest pain, unspecified: Secondary | ICD-10-CM | POA: Diagnosis not present

## 2018-04-26 DIAGNOSIS — I10 Essential (primary) hypertension: Secondary | ICD-10-CM | POA: Diagnosis not present

## 2018-04-26 DIAGNOSIS — R002 Palpitations: Secondary | ICD-10-CM

## 2018-04-26 DIAGNOSIS — F329 Major depressive disorder, single episode, unspecified: Secondary | ICD-10-CM

## 2018-04-26 DIAGNOSIS — E119 Type 2 diabetes mellitus without complications: Secondary | ICD-10-CM

## 2018-04-26 DIAGNOSIS — F419 Anxiety disorder, unspecified: Secondary | ICD-10-CM | POA: Diagnosis not present

## 2018-04-26 DIAGNOSIS — F32A Depression, unspecified: Secondary | ICD-10-CM

## 2018-04-26 DIAGNOSIS — E782 Mixed hyperlipidemia: Secondary | ICD-10-CM | POA: Diagnosis not present

## 2018-04-26 NOTE — Patient Instructions (Addendum)
Medication Instructions:   No medication changes made  Labwork:  No new labs needed  Testing/Procedures:  No further testing at this time  Call if you would like a CT coronary calcium score $150   Follow-Up: It was a pleasure seeing you in the office today. Please call us if you have new issues that need to be addressed before your next appt.  [phone]  Your physician wants you to follow-up in:  As needed  If you need a refill on your cardiac medications before your next appointment, please call your pharmacy.  For educational health videos Log in to : [email or web address] Or : [email or web address], password : [email or web address]

## 2018-07-15 ENCOUNTER — Other Ambulatory Visit: Payer: Self-pay | Admitting: Family Medicine

## 2018-07-17 NOTE — Telephone Encounter (Signed)
Patient transferring care to you on 08/29/18 ok to fill Lisinopril saw Claris CheMargaret on 3/19

## 2018-08-23 ENCOUNTER — Other Ambulatory Visit: Payer: Self-pay | Admitting: Family

## 2018-08-23 DIAGNOSIS — E119 Type 2 diabetes mellitus without complications: Secondary | ICD-10-CM

## 2018-08-23 DIAGNOSIS — R002 Palpitations: Secondary | ICD-10-CM

## 2018-08-29 ENCOUNTER — Ambulatory Visit (INDEPENDENT_AMBULATORY_CARE_PROVIDER_SITE_OTHER): Payer: 59 | Admitting: Internal Medicine

## 2018-08-29 ENCOUNTER — Encounter: Payer: Self-pay | Admitting: Internal Medicine

## 2018-08-29 ENCOUNTER — Other Ambulatory Visit (HOSPITAL_COMMUNITY)
Admission: RE | Admit: 2018-08-29 | Discharge: 2018-08-29 | Disposition: A | Discharge status: Home / Self Care | Payer: 59 | Source: Ambulatory Visit | Attending: Internal Medicine | Admitting: Internal Medicine

## 2018-08-29 VITALS — BP 122/80 | HR 84 | Temp 98.2°F | Ht 72.0 in | Wt 222.6 lb

## 2018-08-29 DIAGNOSIS — E119 Type 2 diabetes mellitus without complications: Secondary | ICD-10-CM

## 2018-08-29 DIAGNOSIS — Z23 Encounter for immunization: Secondary | ICD-10-CM

## 2018-08-29 DIAGNOSIS — D72829 Elevated white blood cell count, unspecified: Secondary | ICD-10-CM

## 2018-08-29 DIAGNOSIS — L918 Other hypertrophic disorders of the skin: Secondary | ICD-10-CM

## 2018-08-29 DIAGNOSIS — Z1159 Encounter for screening for other viral diseases: Secondary | ICD-10-CM

## 2018-08-29 DIAGNOSIS — K760 Fatty (change of) liver, not elsewhere classified: Secondary | ICD-10-CM

## 2018-08-29 DIAGNOSIS — E559 Vitamin D deficiency, unspecified: Secondary | ICD-10-CM

## 2018-08-29 DIAGNOSIS — Z1283 Encounter for screening for malignant neoplasm of skin: Secondary | ICD-10-CM | POA: Diagnosis not present

## 2018-08-29 DIAGNOSIS — Z0184 Encounter for antibody response examination: Secondary | ICD-10-CM

## 2018-08-29 LAB — COMPREHENSIVE METABOLIC PANEL
ALT: 26 U/L (ref 0–53)
AST: 18 U/L (ref 0–37)
Albumin: 4.5 g/dL (ref 3.5–5.2)
Alkaline Phosphatase: 48 U/L (ref 39–117)
BILIRUBIN TOTAL: 0.4 mg/dL (ref 0.2–1.2)
BUN: 14 mg/dL (ref 6–23)
CALCIUM: 9.7 mg/dL (ref 8.4–10.5)
CHLORIDE: 102 meq/L (ref 96–112)
CO2: 25 meq/L (ref 19–32)
Creatinine, Ser: 0.89 mg/dL (ref 0.40–1.50)
GFR: 104.41 mL/min (ref >60.00)
GLUCOSE: 103 mg/dL — AB (ref 70–99)
Potassium: 4.6 mEq/L (ref 3.5–5.1)
Sodium: 138 mEq/L (ref 135–145)
Total Protein: 7.8 g/dL (ref 6.0–8.3)

## 2018-08-29 LAB — URINALYSIS, ROUTINE W REFLEX MICROSCOPIC
Bilirubin Urine: NEGATIVE
Hgb urine dipstick: NEGATIVE
KETONES UR: NEGATIVE
Leukocytes, UA: NEGATIVE
Nitrite: NEGATIVE
PH: 6 (ref 5.0–8.0)
RBC / HPF: NONE SEEN (ref 0–?)
SPECIFIC GRAVITY, URINE: 1.025 (ref 1.000–1.030)
Total Protein, Urine: NEGATIVE
UROBILINOGEN UA: 0.2 (ref 0.0–1.0)
Urine Glucose: NEGATIVE

## 2018-08-29 LAB — CBC WITH DIFFERENTIAL/PLATELET
BASOS PCT: 0.4 % (ref 0.0–3.0)
Basophils Absolute: 0 10*3/uL (ref 0.0–0.1)
EOS PCT: 0.9 % (ref 0.0–5.0)
Eosinophils Absolute: 0.1 10*3/uL (ref 0.0–0.7)
HEMATOCRIT: 39.4 % (ref 39.0–52.0)
Hemoglobin: 12.9 g/dL — ABNORMAL LOW (ref 13.0–17.0)
LYMPHS ABS: 2.6 10*3/uL (ref 0.7–4.0)
LYMPHS PCT: 28.8 % (ref 12.0–46.0)
MCHC: 32.7 g/dL (ref 30.0–36.0)
MCV: 87.5 fl (ref 78.0–100.0)
MONOS PCT: 6.6 % (ref 3.0–12.0)
Monocytes Absolute: 0.6 10*3/uL (ref 0.1–1.0)
NEUTROS ABS: 5.7 10*3/uL (ref 1.4–7.7)
NEUTROS PCT: 63.3 % (ref 43.0–77.0)
PLATELETS: 235 10*3/uL (ref 150.0–400.0)
RBC: 4.5 Mil/uL (ref 4.22–5.81)
RDW: 14.9 % (ref 11.5–15.5)
WBC: 8.9 10*3/uL (ref 4.0–10.5)

## 2018-08-29 LAB — MICROALBUMIN / CREATININE URINE RATIO
Creatinine,U: 98.3 mg/dL
MICROALB/CREAT RATIO: 0.7 mg/g (ref 0.0–30.0)

## 2018-08-29 LAB — HEMOGLOBIN A1C: Hgb A1c MFr Bld: 6.1 % (ref 4.6–6.5)

## 2018-08-29 LAB — VITAMIN D 25 HYDROXY (VIT D DEFICIENCY, FRACTURES): VITD: 31.16 ng/mL (ref 30.00–100.00)

## 2018-08-29 NOTE — Progress Notes (Signed)
Chief Complaint  Patient presents with  . Follow-up   F/u  1. DM 2 doing well on trulicity 1.5, metformin 500 2 pills bid xr, lis 10 and statin  2. BP controlled has had both #1 and #2 since age 32 y.o  3. Right neck skin tag painful and sore    Review of Systems  Constitutional: Negative for weight loss.  HENT: Negative for hearing loss.   Eyes: Negative for blurred vision.  Respiratory: Negative for shortness of breath.   Cardiovascular: Negative for chest pain.  Skin:       +right neck skin tag   Neurological: Negative for headaches.  Psychiatric/Behavioral: Negative for depression.   Past Medical History:  Diagnosis Date  . Chicken pox   . Depression   . Diabetes mellitus without complication ([place])   . History of alcohol abuse   . Hyperlipidemia   . Hypertension    Past Surgical History:  Procedure Laterality Date  . MANDIBLE FRACTURE SURGERY     Family History  Problem Relation Age of Onset  . Alcohol abuse Mother   . Hypertension Mother   . Heart disease Mother   . Diabetes Mother   . Heart attack Mother 34  . Alcohol abuse Father   . Hypertension Father   . Diabetes Father   . Alcohol abuse Maternal Grandmother   . Heart disease Maternal Grandmother   . Hypertension Maternal Grandmother   . Stroke Paternal Grandfather    Social History   Socioeconomic History  . Marital status: Married    Spouse name: Not on file  . Number of children: Not on file  . Years of education: Not on file  . Highest education level: Not on file  Occupational History  . Not on file  Social Needs  . Financial resource strain: Not on file  . Food insecurity:    Worry: Not on file    Inability: Not on file  . Transportation needs:    Medical: Not on file    Non-medical: Not on file  Tobacco Use  . Smoking status: Former Research scientist (life sciences)  . Smokeless tobacco: Former Network engineer and Sexual Activity  . Alcohol use: Yes    Alcohol/week: 6.0 standard drinks    Types: 6  Standard drinks or equivalent per week  . Drug use: Yes    Types: Marijuana  . Sexual activity: Not on file  Lifestyle  . Physical activity:    Days per week: Not on file    Minutes per session: Not on file  . Stress: Not on file  Relationships  . Social connections:    Talks on phone: Not on file    Gets together: Not on file    Attends religious service: Not on file    Active member of club or organization: Not on file    Attends meetings of clubs or organizations: Not on file    Relationship status: Not on file  . Intimate partner violence:    Fear of current or ex partner: Not on file    Emotionally abused: Not on file    Physically abused: Not on file    Forced sexual activity: Not on file  Other Topics Concern  . Not on file  Social History Narrative  . Not on file   Current Meds  Medication Sig  . atorvastatin (LIPITOR) 40 MG tablet TAKE 1 TABLET DAILY  . blood glucose meter kit and supplies KIT Dispense based on patient and insurance  preference. Use up to four times daily as directed.  . busPIRone (BUSPAR) 10 MG tablet TAKE 1 TABLET TWICE A DAY  . escitalopram (LEXAPRO) 20 MG tablet TAKE 1 TABLET DAILY  . lisinopril (PRINIVIL,ZESTRIL) 10 MG tablet TAKE 1 TABLET DAILY  . metFORMIN (GLUCOPHAGE-XR) 500 MG 24 hr tablet TAKE 2 TABLETS TWICE A DAY  . ONE TOUCH ULTRA TEST test strip   . ONETOUCH DELICA LANCETS 47Q MISC   . TRULICITY 1.5 SX/2.8SK SOPN INJECT 1.5 MG UNDER THE SKIN ONCE A WEEK   Allergies  Allergen Reactions  . Invokana [Canagliflozin] Rash  . Penicillins Rash   No results found for this or any previous visit (from the past 2160 hour(s)). Objective  Body mass index is 30.19 kg/m. Wt Readings from Last 3 Encounters:  08/29/18 222 lb 9.6 oz (101 kg)  04/26/18 220 lb (99.8 kg)  02/24/18 230 lb 6 oz (104.5 kg)   Temp Readings from Last 3 Encounters:  08/29/18 98.2 F (36.8 C) (Oral)  02/24/18 98.3 F (36.8 C) (Oral)  07/18/17 98.9 F (37.2 C)  (Oral)   BP Readings from Last 3 Encounters:  08/29/18 122/80  04/26/18 130/90  02/24/18 118/72   Pulse Readings from Last 3 Encounters:  08/29/18 84  04/26/18 82  02/24/18 (!) 109    Physical Exam  Constitutional: He is oriented to person, place, and time. Vital signs are normal. He appears well-developed and well-nourished. He is cooperative.  HENT:  Head: Normocephalic and atraumatic.  Mouth/Throat: Oropharynx is clear and moist and mucous membranes are normal.  Eyes: Pupils are equal, round, and reactive to light. Conjunctivae are normal.  Cardiovascular: Normal rate, regular rhythm and normal heart sounds.  Pulmonary/Chest: Effort normal and breath sounds normal.  Neurological: He is alert and oriented to person, place, and time. Gait normal.  Skin: Skin is warm and dry.     Psychiatric: He has a normal mood and affect. His speech is normal and behavior is normal. Judgment and thought content normal. Cognition and memory are normal.  Nursing note and vitals reviewed.   Assessment   1. DM 2  2. Right neck skin tag  3. HM Plan   1. Cont meds controlled  Check CMET, CBC, A1C, ua, urine protein hep B, mmr, vit D today  2. Consent signed removed with suture removal kit cleaned with alcohol and sent bx tolerate procedure   3.  Flu shot today  Check hep B status see if needs twinrix h/o fatty liver  tdap utd  Check MMR  Had pna utd    Provider: Dr. Olivia Mackie McLean-Scocuzza-Internal Medicine

## 2018-08-29 NOTE — Progress Notes (Signed)
Pre visit review using our clinic review tool, if applicable. No additional management support is needed unless otherwise documented below in the visit note. 

## 2018-08-29 NOTE — Patient Instructions (Addendum)
F/u in 4-6 months sooner if needed  Vitamin D for now take D3 2000-5000 IU daily  Hypoglycemia Hypoglycemia occurs when the level of sugar (glucose) in the blood is too low. Glucose is a type of sugar that provides the body's main source of energy. Certain hormones (insulin and glucagon) control the level of glucose in the blood. Insulin lowers blood glucose, and glucagon increases blood glucose. Hypoglycemia can result from having too much insulin in the bloodstream, or from not eating enough food that contains glucose. Hypoglycemia can happen in people who do or do not have diabetes. It can develop quickly, and it can be a medical emergency. What are the causes? Hypoglycemia occurs most often in people who have diabetes. If you have diabetes, hypoglycemia may be caused by:  Diabetes medicine.  Not eating enough, or not eating often enough.  Increased physical activity.  Drinking alcohol, especially when you have not eaten recently.  If you do not have diabetes, hypoglycemia may be caused by:  A tumor in the pancreas. The pancreas is the organ that makes insulin.  Not eating enough, or not eating for long periods at a time (fasting).  Severe infection or illness that affects the liver, heart, or kidneys.  Certain medicines.  You may also have reactive hypoglycemia. This condition causes hypoglycemia within 4 hours of eating a meal. This may occur after having stomach surgery. Sometimes, the cause of reactive hypoglycemia is not known. What increases the risk? Hypoglycemia is more likely to develop in:  People who have diabetes and take medicines to lower blood glucose.  People who abuse alcohol.  People who have a severe illness.  What are the signs or symptoms? Hypoglycemia may not cause any symptoms. If you have symptoms, they may include:  Hunger.  Anxiety.  Sweating and feeling clammy.  Confusion.  Dizziness or feeling  light-headed.  Sleepiness.  Nausea.  Increased heart rate.  Headache.  Blurry vision.  Seizure.  Nightmares.  Tingling or numbness around the mouth, lips, or tongue.  A change in speech.  Decreased ability to concentrate.  A change in coordination.  Restless sleep.  Tremors or shakes.  Fainting.  Irritability.  How is this diagnosed? Hypoglycemia is diagnosed with a blood test to measure your blood glucose level. This blood test is done while you are having symptoms. Your health care provider may also do a physical exam and review your medical history. If you do not have diabetes, other tests may be done to find the cause of your hypoglycemia. How is this treated? This condition can often be treated by immediately eating or drinking something that contains glucose, such as:  3-4 sugar tablets (glucose pills).  Glucose gel, 15-gram tube.  Fruit juice, 4 oz (120 mL).  Regular soda (not diet soda), 4 oz (120 mL).  Low-fat milk, 4 oz (120 mL).  Several pieces of hard candy.  Sugar or honey, 1 Tbsp.  Treating Hypoglycemia If You Have Diabetes  If you are alert and able to swallow safely, follow the 15:15 rule:  Take 15 grams of a rapid-acting carbohydrate. Rapid-acting options include: ? 1 tube of glucose gel. ? 3 glucose pills. ? 6-8 pieces of hard candy. ? 4 oz (120 mL) of fruit juice. ? 4 oz (120 ml) of regular (not diet) soda.  Check your blood glucose 15 minutes after you take the carbohydrate.  If the repeat blood glucose level is still at or below 70 mg/dL (3.9 mmol/L), take 15 grams  of a carbohydrate again.  If your blood glucose level does not increase above 70 mg/dL (3.9 mmol/L) after 3 tries, seek emergency medical care.  After your blood glucose level returns to normal, eat a meal or a snack within 1 hour.  Treating Severe Hypoglycemia Severe hypoglycemia is when your blood glucose level is at or below 54 mg/dL (3 mmol/L). Severe  hypoglycemia is an emergency. Do not wait to see if the symptoms will go away. Get medical help right away. Call your local emergency services (911 in the [place] Do not drive yourself to the hospital. If you have severe hypoglycemia and you cannot eat or drink, you may need an injection of glucagon. A family member or close friend should learn how to check your blood glucose and how to give you a glucagon injection. Ask your health care provider if you need to have an emergency glucagon injection kit available. Severe hypoglycemia may need to be treated in a hospital. The treatment may include getting glucose through an IV tube. You may also need treatment for the cause of your hypoglycemia. Follow these instructions at home: General instructions  Avoid any diets that cause you to not eat enough food. Talk with your health care provider before you start any new diet.  Take over-the-counter and prescription medicines only as told by your health care provider.  Limit alcohol intake to no more than 1 drink per day for nonpregnant women and 2 drinks per day for men. One drink equals 12 oz of beer, 5 oz of wine, or 1 oz of hard liquor.  Keep all follow-up visits as told by your health care provider. This is important. If You Have Diabetes:   Make sure you know the symptoms of hypoglycemia.  Always have a rapid-acting carbohydrate snack with you to treat low blood sugar.  Follow your diabetes management plan, as told by your health care provider. Make sure you: ? Take your medicines as directed. ? Follow your exercise plan. ? Follow your meal plan. Eat on time, and do not skip meals. ? Check your blood glucose as often as directed. Make sure to check your blood glucose before and after exercise. If you exercise longer or in a different way than usual, check your blood glucose more often. ? Follow your sick day plan whenever you cannot eat or drink normally. Make this plan in advance with your  health care provider.  Share your diabetes management plan with people in your workplace, school, and household.  Check your urine for ketones when you are ill and as told by your health care provider.  Carry a medical alert card or wear medical alert jewelry. If You Have Reactive Hypoglycemia or Low Blood Sugar From Other Causes:  Monitor your blood glucose as told by your health care provider.  Follow instructions from your health care provider about eating or drinking restrictions. Contact a health care provider if:  You have problems keeping your blood glucose in your target range.  You have frequent episodes of hypoglycemia. Get help right away if:  You continue to have hypoglycemia symptoms after eating or drinking something containing glucose.  Your blood glucose is at or below 54 mg/dL (3 mmol/L).  You have a seizure.  You faint. These symptoms may represent a serious problem that is an emergency. Do not wait to see if the symptoms will go away. Get medical help right away. Call your local emergency services (911 in the [place] Do not drive yourself  to the hospital. This information is not intended to replace advice given to you by your health care provider. Make sure you discuss any questions you have with your health care provider. Document Released: 12/13/2005 Document Revised: 05/26/2016 Document Reviewed: 01/16/2016 Elsevier Interactive Patient Education  [employer].

## 2018-08-30 LAB — MEASLES/MUMPS/RUBELLA IMMUNITY
MUMPS IGG: 18.2 [AU]/ml
RUBEOLA IGG: 72.7 [AU]/ml
Rubella: 1.44 index

## 2018-08-30 LAB — HEPATITIS B SURFACE ANTIBODY, QUANTITATIVE: HEPATITIS B-POST: 23 m[IU]/mL (ref >=10)

## 2018-10-11 ENCOUNTER — Other Ambulatory Visit: Payer: Self-pay | Admitting: Family Medicine

## 2018-10-11 NOTE — Telephone Encounter (Signed)
Last prescribed by Dr. Adriana Simas now your patient ok to fill? Last OV 08/29/18

## 2018-10-18 ENCOUNTER — Other Ambulatory Visit: Payer: Self-pay | Admitting: Internal Medicine

## 2018-10-18 DIAGNOSIS — E119 Type 2 diabetes mellitus without complications: Secondary | ICD-10-CM

## 2018-10-18 MED ORDER — LISINOPRIL 10 MG PO TABS: 10.0000 mg | ORAL_TABLET | Freq: Every day | ORAL | 3 refills | Status: DC

## 2018-10-28 ENCOUNTER — Other Ambulatory Visit: Payer: Self-pay | Admitting: Internal Medicine

## 2018-10-28 DIAGNOSIS — E119 Type 2 diabetes mellitus without complications: Secondary | ICD-10-CM

## 2018-10-28 DIAGNOSIS — R002 Palpitations: Secondary | ICD-10-CM

## 2018-10-29 ENCOUNTER — Other Ambulatory Visit: Payer: Self-pay | Admitting: Family Medicine

## 2018-12-13 ENCOUNTER — Telehealth: Payer: Self-pay | Admitting: Internal Medicine

## 2018-12-13 NOTE — Telephone Encounter (Signed)
Call pt he needs an eye exam if he has not had one in the last 1 year Does he want referral?   TMS

## 2018-12-14 NOTE — Telephone Encounter (Signed)
Informed patient and he does want referral

## 2018-12-29 ENCOUNTER — Encounter: Payer: Self-pay | Admitting: Internal Medicine

## 2018-12-29 ENCOUNTER — Ambulatory Visit (INDEPENDENT_AMBULATORY_CARE_PROVIDER_SITE_OTHER): Payer: 59 | Admitting: Internal Medicine

## 2018-12-29 VITALS — BP 138/78 | HR 97 | Temp 98.5°F | Ht 72.0 in | Wt 233.4 lb

## 2018-12-29 DIAGNOSIS — K760 Fatty (change of) liver, not elsewhere classified: Secondary | ICD-10-CM

## 2018-12-29 DIAGNOSIS — R002 Palpitations: Secondary | ICD-10-CM | POA: Diagnosis not present

## 2018-12-29 DIAGNOSIS — I10 Essential (primary) hypertension: Secondary | ICD-10-CM

## 2018-12-29 DIAGNOSIS — L42 Pityriasis rosea: Secondary | ICD-10-CM

## 2018-12-29 DIAGNOSIS — E119 Type 2 diabetes mellitus without complications: Secondary | ICD-10-CM

## 2018-12-29 DIAGNOSIS — E782 Mixed hyperlipidemia: Secondary | ICD-10-CM

## 2018-12-29 DIAGNOSIS — Z1159 Encounter for screening for other viral diseases: Secondary | ICD-10-CM

## 2018-12-29 MED ORDER — TRIAMCINOLONE ACETONIDE 0.1 % EX CREA: 1.0000 "application " | TOPICAL_CREAM | Freq: Two times a day (BID) | CUTANEOUS | 0 refills | Status: DC

## 2018-12-29 NOTE — Progress Notes (Signed)
Chief Complaint  Patient presents with  . Follow-up   F/u  1. Rash started chest 09/2018 and moved down to abdomen dry and itchy at times happened when stayed in [place] no one else in family has the rash tried 8 day course of steroids oral with teledoc then 20 day steroids and stopped rash is less red and scaly and going away derm appt 01/10/19 Dr. Kellie Moor for tbse and check rash  2. DM 2 A1C 6.1 controlled on trulicity 1.5 and metformin and ACEI and statin  3. HTN controlled    Review of Systems  Constitutional: Negative for weight loss.  HENT: Negative for hearing loss.   Eyes: Negative for blurred vision.  Respiratory: Negative for shortness of breath.   Cardiovascular: Negative for chest pain.  Gastrointestinal: Negative for abdominal pain.  Musculoskeletal: Negative for falls.  Skin: Positive for itching and rash.  Neurological: Negative for headaches.  Psychiatric/Behavioral: Negative for depression.   Past Medical History:  Diagnosis Date  . Chicken pox   . Depression   . Diabetes mellitus without complication ([place])   . History of alcohol abuse   . Hyperlipidemia   . Hypertension    Past Surgical History:  Procedure Laterality Date  . MANDIBLE FRACTURE SURGERY     Family History  Problem Relation Age of Onset  . Alcohol abuse Mother   . Hypertension Mother   . Heart disease Mother   . Diabetes Mother   . Heart attack Mother 68  . Alcohol abuse Father   . Hypertension Father   . Diabetes Father   . Alcohol abuse Maternal Grandmother   . Heart disease Maternal Grandmother   . Hypertension Maternal Grandmother   . Stroke Paternal Grandfather    Social History   Socioeconomic History  . Marital status: Married    Spouse name: Not on file  . Number of children: Not on file  . Years of education: Not on file  . Highest education level: Not on file  Occupational History  . Not on file  Social Needs  . Financial resource strain: Not on file  .  Food insecurity:    Worry: Not on file    Inability: Not on file  . Transportation needs:    Medical: Not on file    Non-medical: Not on file  Tobacco Use  . Smoking status: Former Research scientist (life sciences)  . Smokeless tobacco: Former Network engineer and Sexual Activity  . Alcohol use: Yes    Alcohol/week: 6.0 standard drinks    Types: 6 Standard drinks or equivalent per week  . Drug use: Yes    Types: Marijuana  . Sexual activity: Not on file  Lifestyle  . Physical activity:    Days per week: Not on file    Minutes per session: Not on file  . Stress: Not on file  Relationships  . Social connections:    Talks on phone: Not on file    Gets together: Not on file    Attends religious service: Not on file    Active member of club or organization: Not on file    Attends meetings of clubs or organizations: Not on file    Relationship status: Not on file  . Intimate partner violence:    Fear of current or ex partner: Not on file    Emotionally abused: Not on file    Physically abused: Not on file    Forced sexual activity: Not on file  Other Topics Concern  .  Not on file  Social History Narrative   Works IT    Married    Current Meds  Medication Sig  . atorvastatin (LIPITOR) 40 MG tablet TAKE 1 TABLET DAILY  . blood glucose meter kit and supplies KIT Dispense based on patient and insurance preference. Use up to four times daily as directed.  . busPIRone (BUSPAR) 10 MG tablet TAKE 1 TABLET TWICE A DAY  . escitalopram (LEXAPRO) 20 MG tablet TAKE 1 TABLET DAILY  . lisinopril (PRINIVIL,ZESTRIL) 10 MG tablet Take 1 tablet (10 mg total) by mouth daily.  . metFORMIN (GLUCOPHAGE-XR) 500 MG 24 hr tablet TAKE 2 TABLETS TWICE A DAY (NEED APPOINTMENT WITH NEW PRIMARY CARE PHYSICIAN BEFORE RECEIVING FURTHER REFILLS)  . ONE TOUCH ULTRA TEST test strip   . ONETOUCH DELICA LANCETS 78M MISC   . TRULICITY 1.5 VE/7.2CN SOPN INJECT 1.5 MG UNDER THE SKIN ONCE A WEEK   Allergies  Allergen Reactions  .  Invokana [Canagliflozin] Rash  . Penicillins Rash   No results found for this or any previous visit (from the past 2160 hour(s)). Objective  Body mass index is 31.65 kg/m. Wt Readings from Last 3 Encounters:  12/29/18 233 lb 6.4 oz (105.9 kg)  08/29/18 222 lb 9.6 oz (101 kg)  04/26/18 220 lb (99.8 kg)   Temp Readings from Last 3 Encounters:  12/29/18 98.5 F (36.9 C) (Oral)  08/29/18 98.2 F (36.8 C) (Oral)  02/24/18 98.3 F (36.8 C) (Oral)   BP Readings from Last 3 Encounters:  12/29/18 138/78  08/29/18 122/80  04/26/18 130/90   Pulse Readings from Last 3 Encounters:  12/29/18 97  08/29/18 84  04/26/18 82    Physical Exam Vitals signs and nursing note reviewed.  Constitutional:      Appearance: Normal appearance. He is obese.  HENT:     Head: Normocephalic and atraumatic.     Mouth/Throat:     Mouth: Mucous membranes are moist.     Pharynx: Oropharynx is clear.  Eyes:     Conjunctiva/sclera: Conjunctivae normal.     Pupils: Pupils are equal, round, and reactive to light.  Cardiovascular:     Rate and Rhythm: Normal rate and regular rhythm.     Heart sounds: Normal heart sounds.  Pulmonary:     Effort: Pulmonary effort is normal.     Breath sounds: Normal breath sounds.  Skin:    General: Skin is warm and dry.     Findings: Rash present.     Comments: Rash to trunk ovals with red scale   Neurological:     General: No focal deficit present.     Mental Status: He is alert and oriented to person, place, and time. Mental status is at baseline.     Gait: Gait normal.  Psychiatric:        Attention and Perception: Attention and perception normal.        Mood and Affect: Mood and affect normal.        Speech: Speech normal.        Behavior: Behavior normal. Behavior is cooperative.        Thought Content: Thought content normal.        Cognition and Memory: Cognition and memory normal.        Judgment: Judgment normal.     Assessment   1. Rash likely  PR 2. DM 2 A1C 6.1  3. HTN  4. HM Plan   1. Refer derm 01/10/2019  2. Cont  meds  sch fasting labs 02/2019 f/u in 6 months  3. Cont meds BP controlled  4.  Flu shot utd  Immune hep B status see if needs twinrix h/o fatty liver check hep A  tdap utd  MMR immune  Had pna utd   D3 5000 IU daily  Fasting labs 02/2019   Provider: Dr. Olivia Mackie McLean-Scocuzza-[facility]

## 2018-12-29 NOTE — Patient Instructions (Addendum)
Pityriasis rosea I think  Sarna lotion for itching otc    Pityriasis Rosea Pityriasis rosea is a rash that usually appears on the chest, abdomen, and back. It may also appear on the upper arms and upper legs. It usually begins as a single patch, and then more patches start to develop. The rash may cause mild itching, but it normally does not cause other problems. It usually goes away without treatment. However, it may take weeks or months for the rash to go away completely. What are the causes? The cause of this condition is not known. The condition does not spread from person to person (is not contagious). What increases the risk? This condition is more likely to develop in:  Persons aged 10-35 years.  Pregnant women. It is more common in the spring and fall seasons. What are the signs or symptoms? The main symptom of this condition is a rash.  The rash usually begins with a single oval patch that is larger than the ones that follow. This is called a herald patch. It generally appears a week or more before the rest of the rash appears.  When more patches start to develop, they spread quickly on the chest, abdomen, back, arms, and legs. These patches are smaller than the first one.  The patches that make up the rash are usually oval-shaped and pink or red in color. They are usually flat but may sometimes be raised so that they can be felt with a finger. They may also be finely crinkled and have a scaly ring around the edge. Some people may have mild itching and nonspecific symptoms, such as:  Nausea.  Loss of appetite.  Difficulty concentrating.  Headache.  Irritability.  Sore throat.  Mild fever. How is this diagnosed? This condition may be diagnosed based on:  Your medical history and a physical exam.  Tests to rule out other causes. This may include blood tests or a test in which a small sample of skin is removed from the rash (biopsy) and checked in a lab. How is this  treated?     Treatment is not usually needed for this condition. The rash will often go away on its own in 4-8 weeks. In some cases, a health care provider may recommend or prescribe medicine to reduce itching. Follow these instructions at home:  Take or apply over-the-counter and prescription medicines only as told by your health care provider.  Avoid scratching the affected areas of skin.  Do not take hot baths or use a sauna. Use only warm water when bathing or showering. Heat can increase itching. Adding cornstarch to your bath may help to relieve the itching.  Avoid exposure to the sun and other sources of UV light, such as tanning beds, as told by your health care provider. UV light may help the rash go away but may cause unwanted changes in skin color.  Keep all follow-up visits as told by your health care provider. This is important. Contact a health care provider if:  Your rash does not go away in 8 weeks.  Your rash gets much worse.  You have a fever.  You have swelling or pain in the rash area.  You have fluid, blood, or pus coming from the rash area. Summary  Pityriasis rosea is a rash that usually appears on the trunk of the body. It can also appear on the upper arms and upper legs.  The rash usually begins with a single oval patch (herald patch)  that appears a week or more before the rest of the rash appears. The herald patch is larger than the ones that follow.  The rash may cause mild itching, but it usually does not cause other problems. It usually goes away without treatment in 4-8 weeks.  In some cases, a health care provider may recommend or prescribe medicine to reduce itching. This information is not intended to replace advice given to you by your health care provider. Make sure you discuss any questions you have with your health care provider. Document Released: 01/19/2002 Document Revised: 12/12/2017 Document Reviewed: 12/12/2017 Elsevier Interactive  Patient Education  2019 [employer]    Nonalcoholic Fatty Liver Disease Diet Nonalcoholic fatty liver disease is a condition that causes fat to accumulate in and around the liver. The disease makes it harder for the liver to work the way that it should. Following a healthy diet can help to keep nonalcoholic fatty liver disease under control. It can also help to prevent or improve conditions that are associated with the disease, such as heart disease, diabetes, high blood pressure, and abnormal cholesterol levels. Along with regular exercise, this diet:  Promotes weight loss.  Helps to control blood sugar levels.  Helps to improve the way that the body uses insulin. What do I need to know about this diet?  Use the glycemic index (GI) to plan your meals. The index tells you how quickly a food will raise your blood sugar. Choose low-GI foods. These foods take a longer time to raise blood sugar.  Keep track of how many calories you take in. Eating the right amount of calories will help you to achieve a healthy weight.  You may want to follow a Mediterranean diet. This diet includes a lot of vegetables, lean meats or fish, whole grains, fruits, and healthy oils and fats. What foods can I eat? Grains Whole grains, such as whole-wheat or whole-grain breads, crackers, tortillas, cereals, and pasta. Stone-ground whole wheat. Pumpernickel bread. Unsweetened oatmeal. Bulgur. Barley. Quinoa. Brown or wild rice. Corn or whole-wheat flour tortillas. Vegetables Lettuce. Spinach. Peas. Beets. Cauliflower. Cabbage. Broccoli. Carrots. Tomatoes. Squash. Eggplant. Herbs. Peppers. Onions. Cucumbers. Brussels sprouts. Yams and sweet potatoes. Beans. Lentils. Fruits Bananas. Apples. Oranges. Grapes. Papaya. Mango. Pomegranate. Kiwi. Grapefruit. Cherries. Meats and Other Protein Sources Seafood and shellfish. Lean meats. Poultry. Tofu. Dairy Low-fat or fat-free dairy products, such as yogurt, cottage cheese,  and cheese. Beverages Water. Sugar-free drinks. Tea. Coffee. Low-fat or skim milk. Milk alternatives, such as soy or almond milk. Real fruit juice. Condiments Mustard. Relish. Low-fat, low-sugar ketchup and barbecue sauce. Low-fat or fat-free mayonnaise. Sweets and Desserts Sugar-free sweets. Fats and Oils Avocado. Canola or olive oil. Nuts and nut butters. Seeds. The items listed above may not be a complete list of recommended foods or beverages. Contact your dietitian for more options. What foods are not recommended? Palm oil and coconut oil. Processed foods. Fried foods. Sweetened drinks, such as sweet tea, milkshakes, snow cones, iced sweet drinks, and sodas. Alcohol. Sweets. Foods that contain a lot of salt or sodium. The items listed above may not be a complete list of foods and beverages to avoid. Contact your dietitian for more information. This information is not intended to replace advice given to you by your health care provider. Make sure you discuss any questions you have with your health care provider. Document Released: 04/29/2015 Document Revised: 05/20/2016 Document Reviewed: 01/07/2015 Elsevier Interactive Patient Education  2019 [employer]  Fatty Liver Disease  Fatty liver disease occurs when too much fat has built up in your liver cells. Fatty liver disease is also called hepatic steatosis or steatohepatitis. The liver removes harmful substances from your bloodstream and produces fluids that your body needs. It also helps your body use and store energy from the food you eat. In many cases, fatty liver disease does not cause symptoms or problems. It is often diagnosed when tests are being done for other reasons. However, over time, fatty liver can cause inflammation that may lead to more serious liver problems, such as scarring of the liver (cirrhosis) and liver failure. Fatty liver is associated with insulin resistance, increased body fat, high blood pressure  (hypertension), and high cholesterol. These are features of metabolic syndrome and increase your risk for stroke, diabetes, and heart disease. What are the causes? This condition may be caused by:  Drinking too much alcohol.  Poor nutrition.  Obesity.  Cushing's syndrome.  Diabetes.  High cholesterol.  Certain drugs.  Poisons.  Some viral infections.  Pregnancy. What increases the risk? You are more likely to develop this condition if you:  Abuse alcohol.  Are overweight.  Have diabetes.  Have hepatitis.  Have a high triglyceride level.  Are pregnant. What are the signs or symptoms? Fatty liver disease often does not cause symptoms. If symptoms do develop, they can include:  Fatigue.  Weakness.  Weight loss.  Confusion.  Abdominal pain.  Nausea and vomiting.  Yellowing of your skin and the white parts of your eyes (jaundice).  Itchy skin. How is this diagnosed? This condition may be diagnosed by:  A physical exam and medical history.  Blood tests.  Imaging tests, such as an ultrasound, CT scan, or MRI.  A liver biopsy. A small sample of liver tissue is removed using a needle. The sample is then looked at under a microscope. How is this treated? Fatty liver disease is often caused by other health conditions. Treatment for fatty liver may involve medicines and lifestyle changes to manage conditions such as:  Alcoholism.  High cholesterol.  Diabetes.  Being overweight or obese. Follow these instructions at home:   Do not drink alcohol. If you have trouble quitting, ask your health care provider how to safely quit with the help of medicine or a supervised program. This is important to keep your condition from getting worse.  Eat a healthy diet as told by your health care provider. Ask your health care provider about working with a diet and nutrition specialist (dietitian) to develop an eating plan.  Exercise regularly. This can help you  lose weight and control your cholesterol and diabetes. Talk to your health care provider about an exercise plan and which activities are best for you.  Take over-the-counter and prescription medicines only as told by your health care provider.  Keep all follow-up visits as told by your health care provider. This is important. Contact a health care provider if: You have trouble controlling your:  Blood sugar. This is especially important if you have diabetes.  Cholesterol.  Drinking of alcohol. Get help right away if:  You have abdominal pain.  You have jaundice.  You have nausea and vomiting.  You vomit blood or material that looks like coffee grounds.  You have stools that are black, tar-like, or bloody. Summary  Fatty liver disease develops when too much fat builds up in the cells of your liver.  Fatty liver disease often causes no symptoms or problems. However, over time, fatty liver  can cause inflammation that may lead to more serious liver problems, such as scarring of the liver (cirrhosis).  You are more likely to develop this condition if you abuse alcohol, are pregnant, are overweight, have diabetes, have hepatitis, or have high triglyceride levels.  Contact your health care provider if you have trouble controlling your weight, blood sugar, cholesterol, or drinking of alcohol. This information is not intended to replace advice given to you by your health care provider. Make sure you discuss any questions you have with your health care provider. Document Released: 01/28/2006 Document Revised: 09/21/2017 Document Reviewed: 09/21/2017 Elsevier Interactive Patient Education  2019 [employer]

## 2019-01-02 ENCOUNTER — Encounter: Payer: Self-pay | Admitting: Internal Medicine

## 2019-03-01 ENCOUNTER — Other Ambulatory Visit: Payer: Self-pay | Admitting: Internal Medicine

## 2019-03-01 DIAGNOSIS — E119 Type 2 diabetes mellitus without complications: Secondary | ICD-10-CM

## 2019-03-01 MED ORDER — DULAGLUTIDE 1.5 MG/0.5ML ~~LOC~~ SOAJ: SUBCUTANEOUS | 3 refills | Status: DC

## 2019-03-30 ENCOUNTER — Other Ambulatory Visit: Payer: 59

## 2019-04-24 ENCOUNTER — Other Ambulatory Visit: Payer: Self-pay | Admitting: Internal Medicine

## 2019-04-24 DIAGNOSIS — F419 Anxiety disorder, unspecified: Secondary | ICD-10-CM

## 2019-04-24 DIAGNOSIS — F32A Depression, unspecified: Secondary | ICD-10-CM

## 2019-04-24 DIAGNOSIS — F329 Major depressive disorder, single episode, unspecified: Secondary | ICD-10-CM

## 2019-04-24 MED ORDER — ESCITALOPRAM OXALATE 20 MG PO TABS: 20.0000 mg | ORAL_TABLET | Freq: Every day | ORAL | 3 refills | Status: DC

## 2019-06-12 ENCOUNTER — Telehealth: Payer: Self-pay | Admitting: Internal Medicine

## 2019-06-12 NOTE — Telephone Encounter (Signed)
Call to schedule fasting lab appt before f/u with me in 06/2019 wearing a mask covering nose and mouth

## 2019-07-03 ENCOUNTER — Other Ambulatory Visit: Payer: Self-pay

## 2019-07-03 ENCOUNTER — Other Ambulatory Visit (INDEPENDENT_AMBULATORY_CARE_PROVIDER_SITE_OTHER): Payer: 59

## 2019-07-03 DIAGNOSIS — Z1159 Encounter for screening for other viral diseases: Secondary | ICD-10-CM

## 2019-07-03 DIAGNOSIS — K760 Fatty (change of) liver, not elsewhere classified: Secondary | ICD-10-CM

## 2019-07-03 DIAGNOSIS — E119 Type 2 diabetes mellitus without complications: Secondary | ICD-10-CM | POA: Diagnosis not present

## 2019-07-03 DIAGNOSIS — I10 Essential (primary) hypertension: Secondary | ICD-10-CM

## 2019-07-03 DIAGNOSIS — E782 Mixed hyperlipidemia: Secondary | ICD-10-CM | POA: Diagnosis not present

## 2019-07-03 LAB — CBC WITH DIFFERENTIAL/PLATELET
Basophils Absolute: 0.1 10*3/uL (ref 0.0–0.1)
Basophils Relative: 1 % (ref 0.0–3.0)
Eosinophils Absolute: 0 10*3/uL (ref 0.0–0.7)
Eosinophils Relative: 0.5 % (ref 0.0–5.0)
HCT: 40.6 % (ref 39.0–52.0)
Hemoglobin: 13.4 g/dL (ref 13.0–17.0)
Lymphocytes Relative: 27.6 % (ref 12.0–46.0)
Lymphs Abs: 1.9 10*3/uL (ref 0.7–4.0)
MCHC: 32.9 g/dL (ref 30.0–36.0)
MCV: 90 fl (ref 78.0–100.0)
Monocytes Absolute: 0.6 10*3/uL (ref 0.1–1.0)
Monocytes Relative: 8.4 % (ref 3.0–12.0)
Neutro Abs: 4.3 10*3/uL (ref 1.4–7.7)
Neutrophils Relative %: 62.5 % (ref 43.0–77.0)
Platelets: 253 10*3/uL (ref 150.0–400.0)
RBC: 4.51 Mil/uL (ref 4.22–5.81)
RDW: 14 % (ref 11.5–15.5)
WBC: 6.8 10*3/uL (ref 4.0–10.5)

## 2019-07-03 LAB — LIPID PANEL
Cholesterol: 154 mg/dL (ref 0–200)
HDL: 40.2 mg/dL (ref >39.00)
LDL Cholesterol: 79 mg/dL (ref 0–99)
NonHDL: 113.54
Total CHOL/HDL Ratio: 4
Triglycerides: 173 mg/dL — ABNORMAL HIGH (ref 0.0–149.0)
VLDL: 34.6 mg/dL (ref 0.0–40.0)

## 2019-07-03 LAB — COMPREHENSIVE METABOLIC PANEL
ALT: 77 U/L — ABNORMAL HIGH (ref 0–53)
AST: 39 U/L — ABNORMAL HIGH (ref 0–37)
Albumin: 4.8 g/dL (ref 3.5–5.2)
Alkaline Phosphatase: 46 U/L (ref 39–117)
BUN: 16 mg/dL (ref 6–23)
CO2: 26 mEq/L (ref 19–32)
Calcium: 9.4 mg/dL (ref 8.4–10.5)
Chloride: 101 mEq/L (ref 96–112)
Creatinine, Ser: 1.03 mg/dL (ref 0.40–1.50)
GFR: 82.57 mL/min (ref >60.00)
Glucose, Bld: 96 mg/dL (ref 70–99)
Potassium: 4.3 mEq/L (ref 3.5–5.1)
Sodium: 136 mEq/L (ref 135–145)
Total Bilirubin: 0.7 mg/dL (ref 0.2–1.2)
Total Protein: 7.2 g/dL (ref 6.0–8.3)

## 2019-07-03 LAB — HEMOGLOBIN A1C: Hgb A1c MFr Bld: 5.9 % (ref 4.6–6.5)

## 2019-07-04 LAB — HEPATITIS A ANTIBODY, TOTAL: Hepatitis A AB,Total: NONREACTIVE

## 2019-07-06 ENCOUNTER — Ambulatory Visit (INDEPENDENT_AMBULATORY_CARE_PROVIDER_SITE_OTHER): Payer: 59 | Admitting: Internal Medicine

## 2019-07-06 ENCOUNTER — Other Ambulatory Visit: Payer: Self-pay

## 2019-07-06 DIAGNOSIS — E119 Type 2 diabetes mellitus without complications: Secondary | ICD-10-CM

## 2019-07-06 DIAGNOSIS — Z0000 Encounter for general adult medical examination without abnormal findings: Secondary | ICD-10-CM | POA: Diagnosis not present

## 2019-07-06 DIAGNOSIS — F32A Depression, unspecified: Secondary | ICD-10-CM

## 2019-07-06 DIAGNOSIS — E782 Mixed hyperlipidemia: Secondary | ICD-10-CM | POA: Diagnosis not present

## 2019-07-06 DIAGNOSIS — I10 Essential (primary) hypertension: Secondary | ICD-10-CM | POA: Diagnosis not present

## 2019-07-06 DIAGNOSIS — F419 Anxiety disorder, unspecified: Secondary | ICD-10-CM

## 2019-07-06 DIAGNOSIS — F329 Major depressive disorder, single episode, unspecified: Secondary | ICD-10-CM

## 2019-07-06 MED ORDER — METFORMIN HCL 500 MG PO TABS: 500.0000 mg | ORAL_TABLET | Freq: Every day | ORAL | 3 refills | Status: DC

## 2019-07-06 NOTE — Progress Notes (Signed)
Virtual Visit via Video Note  I connected with Jorge Wright  on 07/06/19 at  8:45 AM EDT by a video enabled telemedicine application and verified that I am speaking with the correct person using two identifiers.  Location patient: home Location provider:work or home office Persons participating in the virtual visit: patient, provider  I discussed the limitations of evaluation and management by telemedicine and the availability of in person appointments. The patient expressed understanding and agreed to proceed.   HPI: 1. DM 2 C4U 5.9 on trulicity 1.5 and metformin Xr 2 bid 2. HTN BP yesterday 130/93 and today 135/90 before meds wt is 218 today he has been doing keto with hamburger, bacon and steak and will try to cut back  3. Annual    ROS: See pertinent positives and negatives per HPI. General: wt down 218 HEENT: no sore throat  CV: no chest pain  Lungs: no sob GI: no ab pain  GU: no issues  MSK: no issues Skin rash improved was PR Neuro: no h/a Psych: mood anxiety controlled   Past Medical History:  Diagnosis Date  . Chicken pox   . Depression   . Diabetes mellitus without complication ([place])   . History of alcohol abuse   . Hyperlipidemia   . Hypertension     Past Surgical History:  Procedure Laterality Date  . MANDIBLE FRACTURE SURGERY      Family History  Problem Relation Age of Onset  . Alcohol abuse Mother   . Hypertension Mother   . Heart disease Mother   . Diabetes Mother   . Heart attack Mother 15  . Alcohol abuse Father   . Hypertension Father   . Diabetes Father   . Alcohol abuse Maternal Grandmother   . Heart disease Maternal Grandmother   . Hypertension Maternal Grandmother   . 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  atorvastatin (LIPITOR) 40 MG tablet, TAKE 1 TABLET DAILY, Disp: 90 tablet, Rfl: 4 .  blood glucose meter kit and supplies KIT, Dispense based on patient and  insurance preference. Use up to four times daily as directed., Disp: 1 each, Rfl: 0 .  busPIRone (BUSPAR) 10 MG tablet, TAKE 1 TABLET TWICE A DAY, Disp: 180 tablet, Rfl: 3 .  Dulaglutide (TRULICITY) 1.5 GQ/9.1QX SOPN, INJECT 1.5 MG UNDER THE SKIN ONCE A WEEK, Disp: 18 mL, Rfl: 3 .  escitalopram (LEXAPRO) 20 MG tablet, Take 1 tablet (20 mg total) by mouth daily., Disp: 90 tablet, Rfl: 3 .  lisinopril (PRINIVIL,ZESTRIL) 10 MG tablet, Take 1 tablet (10 mg total) by mouth daily., Disp: 90 tablet, Rfl: 3 .  metFORMIN (GLUCOPHAGE) 500 MG tablet, Take 1 tablet (500 mg total) by mouth daily with breakfast., Disp: 90 tablet, Rfl: 3 .  ONE TOUCH ULTRA TEST test strip, , Disp: , Rfl:  .  ONETOUCH DELICA LANCETS 45W MISC, , Disp: , Rfl:  .  triamcinolone cream (KENALOG) 0.1 %, Apply 1 application topically 2 (two) times daily. X 2 weeks, Disp: 454 g, Rfl: 0  EXAM:  VITALS per patient if applicable: 388/82  GENERAL: alert, oriented, appears well and in no acute distress  HEENT: atraumatic, conjunttiva clear, no obvious abnormalities on inspection of external nose and ears  NECK: normal movements of the head and neck  LUNGS: on inspection no signs of respiratory distress, breathing rate appears normal, no obvious gross SOB, gasping or wheezing  CV:  no obvious cyanosis  MS: moves all visible extremities without noticeable abnormality  PSYCH/NEURO: pleasant and cooperative, no obvious depression or anxiety, speech and thought processing grossly intact  ASSESSMENT AND PLAN:  Discussed the following assessment and plan:  Annual physical exam - Plan:  Flu shot utd  Immune hep B status see if needs twinrix h/o fatty liver check hep A neg and rec with fatty liver  tdap utd  MMR immune  Had pna utdconsider repeat 07/2019   D3 5000 IU daily  Fasting labs 02/2019    Type 2 diabetes mellitus without complication, without long-term current use of insulin ([place]) - Plan: metFORMIN (GLUCOPHAGE) 500 MG  tablet qd dx xr 500 mg 2 pills bid   Essential hypertension - Plan: monitor BP on lis 10 if continues >130/>90 increase dose  Mixed hyperlipidemia - Plan: cont meds lip 40 mg qd   Anxiety and depression controlled - Plan: cont buspar     I discussed the assessment and treatment plan with the patient. The patient was provided an opportunity to ask questions and all were answered. The patient agreed with the plan and demonstrated an understanding of the instructions.   The patient was advised to call back or seek an in-person evaluation if the symptoms worsen or if the condition fails to improve as anticipated.  Time spent 15 minute Delorise Jackson, MD

## 2019-07-06 NOTE — Patient Instructions (Signed)
DASH Eating Plan DASH stands for "Dietary Approaches to Stop Hypertension." The DASH eating plan is a healthy eating plan that has been shown to reduce high blood pressure (hypertension). It may also reduce your risk for type 2 diabetes, heart disease, and stroke. The DASH eating plan may also help with weight loss. What are tips for following this plan?  General guidelines  Avoid eating more than 2,300 mg (milligrams) of salt (sodium) a day. If you have hypertension, you may need to reduce your sodium intake to 1,500 mg a day.  Limit alcohol intake to no more than 1 drink a day for nonpregnant women and 2 drinks a day for men. One drink equals 12 oz of beer, 5 oz of wine, or 1 oz of hard liquor.  Work with your health care provider to maintain a healthy body weight or to lose weight. Ask what an ideal weight is for you.  Get at least 30 minutes of exercise that causes your heart to beat faster (aerobic exercise) most days of the week. Activities may include walking, swimming, or biking.  Work with your health care provider or diet and nutrition specialist (dietitian) to adjust your eating plan to your individual calorie needs. Reading food labels   Check food labels for the amount of sodium per serving. Choose foods with less than 5 percent of the Daily Value of sodium. Generally, foods with less than 300 mg of sodium per serving fit into this eating plan.  To find whole grains, look for the word "whole" as the first word in the ingredient list. Shopping  Buy products labeled as "low-sodium" or "no salt added."  Buy fresh foods. Avoid canned foods and premade or frozen meals. Cooking  Avoid adding salt when cooking. Use salt-free seasonings or herbs instead of table salt or sea salt. Check with your health care provider or pharmacist before using salt substitutes.  Do not fry foods. Cook foods using healthy methods such as baking, boiling, grilling, and broiling instead.  Cook with  heart-healthy oils, such as olive, canola, soybean, or sunflower oil. Meal planning  Eat a balanced diet that includes: ? 5 or more servings of fruits and vegetables each day. At each meal, try to fill half of your plate with fruits and vegetables. ? Up to 6-8 servings of whole grains each day. ? Less than 6 oz of lean meat, poultry, or fish each day. A 3-oz serving of meat is about the same size as a deck of cards. One egg equals 1 oz. ? 2 servings of low-fat dairy each day. ? A serving of nuts, seeds, or beans 5 times each week. ? Heart-healthy fats. Healthy fats called Omega-3 fatty acids are found in foods such as flaxseeds and coldwater fish, like sardines, salmon, and mackerel.  Limit how much you eat of the following: ? Canned or prepackaged foods. ? Food that is high in trans fat, such as fried foods. ? Food that is high in saturated fat, such as fatty meat. ? Sweets, desserts, sugary drinks, and other foods with added sugar. ? Full-fat dairy products.  Do not salt foods before eating.  Try to eat at least 2 vegetarian meals each week.  Eat more home-cooked food and less restaurant, buffet, and fast food.  When eating at a restaurant, ask that your food be prepared with less salt or no salt, if possible. What foods are recommended? The items listed may not be a complete list. Talk with your dietitian about   what dietary choices are best for you. Grains Whole-grain or whole-wheat bread. Whole-grain or whole-wheat pasta. Brown rice. Oatmeal. Quinoa. Bulgur. Whole-grain and low-sodium cereals. Pita bread. Low-fat, low-sodium crackers. Whole-wheat flour tortillas. Vegetables Fresh or frozen vegetables (raw, steamed, roasted, or grilled). Low-sodium or reduced-sodium tomato and vegetable juice. Low-sodium or reduced-sodium tomato sauce and tomato paste. Low-sodium or reduced-sodium canned vegetables. Fruits All fresh, dried, or frozen fruit. Canned fruit in natural juice (without  added sugar). Meat and other protein foods Skinless chicken or turkey. Ground chicken or turkey. Pork with fat trimmed off. Fish and seafood. Egg whites. Dried beans, peas, or lentils. Unsalted nuts, nut butters, and seeds. Unsalted canned beans. Lean cuts of beef with fat trimmed off. Low-sodium, lean deli meat. Dairy Low-fat (1%) or fat-free (skim) milk. Fat-free, low-fat, or reduced-fat cheeses. Nonfat, low-sodium ricotta or cottage cheese. Low-fat or nonfat yogurt. Low-fat, low-sodium cheese. Fats and oils Soft margarine without trans fats. Vegetable oil. Low-fat, reduced-fat, or light mayonnaise and salad dressings (reduced-sodium). Canola, safflower, olive, soybean, and sunflower oils. Avocado. Seasoning and other foods Herbs. Spices. Seasoning mixes without salt. Unsalted popcorn and pretzels. Fat-free sweets. What foods are not recommended? The items listed may not be a complete list. Talk with your dietitian about what dietary choices are best for you. Grains Baked goods made with fat, such as croissants, muffins, or some breads. Dry pasta or rice meal packs. Vegetables Creamed or fried vegetables. Vegetables in a cheese sauce. Regular canned vegetables (not low-sodium or reduced-sodium). Regular canned tomato sauce and paste (not low-sodium or reduced-sodium). Regular tomato and vegetable juice (not low-sodium or reduced-sodium). Pickles. Olives. Fruits Canned fruit in a light or heavy syrup. Fried fruit. Fruit in cream or butter sauce. Meat and other protein foods Fatty cuts of meat. Ribs. Fried meat. Bacon. Sausage. Bologna and other processed lunch meats. Salami. Fatback. Hotdogs. Bratwurst. Salted nuts and seeds. Canned beans with added salt. Canned or smoked fish. Whole eggs or egg yolks. Chicken or turkey with skin. Dairy Whole or 2% milk, cream, and half-and-half. Whole or full-fat cream cheese. Whole-fat or sweetened yogurt. Full-fat cheese. Nondairy creamers. Whipped toppings.  Processed cheese and cheese spreads. Fats and oils Butter. Stick margarine. Lard. Shortening. Ghee. Bacon fat. Tropical oils, such as coconut, palm kernel, or palm oil. Seasoning and other foods Salted popcorn and pretzels. Onion salt, garlic salt, seasoned salt, table salt, and sea salt. Worcestershire sauce. Tartar sauce. Barbecue sauce. Teriyaki sauce. Soy sauce, including reduced-sodium. Steak sauce. Canned and packaged gravies. Fish sauce. Oyster sauce. Cocktail sauce. Horseradish that you find on the shelf. Ketchup. Mustard. Meat flavorings and tenderizers. Bouillon cubes. Hot sauce and Tabasco sauce. Premade or packaged marinades. Premade or packaged taco seasonings. Relishes. Regular salad dressings. Where to find more information:  National Heart, Lung, and Blood Institute: www.nhlbi.nih.gov  American Heart Association: www.heart.org Summary  The DASH eating plan is a healthy eating plan that has been shown to reduce high blood pressure (hypertension). It may also reduce your risk for type 2 diabetes, heart disease, and stroke.  With the DASH eating plan, you should limit salt (sodium) intake to 2,300 mg a day. If you have hypertension, you may need to reduce your sodium intake to 1,500 mg a day.  When on the DASH eating plan, aim to eat more fresh fruits and vegetables, whole grains, lean proteins, low-fat dairy, and heart-healthy fats.  Work with your health care provider or diet and nutrition specialist (dietitian) to adjust your eating plan to your   individual calorie needs. This information is not intended to replace advice given to you by your health care provider. Make sure you discuss any questions you have with your health care provider. Document Released: 12/02/2011 Document Revised: 11/25/2017 Document Reviewed: 12/06/2016 Elsevier Patient Education  [place].  Low-Sodium Eating Plan Sodium, which is an element that makes up salt, helps you maintain a healthy  balance of fluids in your body. Too much sodium can increase your blood pressure and cause fluid and waste to be held in your body. Your health care provider or dietitian may recommend following this plan if you have high blood pressure (hypertension), kidney disease, liver disease, or heart failure. Eating less sodium can help lower your blood pressure, reduce swelling, and protect your heart, liver, and kidneys. What are tips for following this plan? General guidelines  Most people on this plan should limit their sodium intake to 1,500-2,000 mg (milligrams) of sodium each day. Reading food labels   The Nutrition Facts label lists the amount of sodium in one serving of the food. If you eat more than one serving, you must multiply the listed amount of sodium by the number of servings.  Choose foods with less than 140 mg of sodium per serving.  Avoid foods with 300 mg of sodium or more per serving. Shopping  Look for lower-sodium products, often labeled as "low-sodium" or "no salt added."  Always check the sodium content even if foods are labeled as "unsalted" or "no salt added".  Buy fresh foods. ? Avoid canned foods and premade or frozen meals. ? Avoid canned, cured, or processed meats  Buy breads that have less than 80 mg of sodium per slice. Cooking  Eat more home-cooked food and less restaurant, buffet, and fast food.  Avoid adding salt when cooking. Use salt-free seasonings or herbs instead of table salt or sea salt. Check with your health care provider or pharmacist before using salt substitutes.  Cook with plant-based oils, such as canola, sunflower, or olive oil. Meal planning  When eating at a restaurant, ask that your food be prepared with less salt or no salt, if possible.  Avoid foods that contain MSG (monosodium glutamate). MSG is sometimes added to [place] food, bouillon, and some canned foods. What foods are recommended? The items listed may not be a complete  list. Talk with your dietitian about what dietary choices are best for you. Grains Low-sodium cereals, including oats, puffed wheat and rice, and shredded wheat. Low-sodium crackers. Unsalted rice. Unsalted pasta. Low-sodium bread. Whole-grain breads and whole-grain pasta. Vegetables Fresh or frozen vegetables. "No salt added" canned vegetables. "No salt added" tomato sauce and paste. Low-sodium or reduced-sodium tomato and vegetable juice. Fruits Fresh, frozen, or canned fruit. Fruit juice. Meats and other protein foods Fresh or frozen (no salt added) meat, poultry, seafood, and fish. Low-sodium canned tuna and salmon. Unsalted nuts. Dried peas, beans, and lentils without added salt. Unsalted canned beans. Eggs. Unsalted nut butters. Dairy Milk. Soy milk. Cheese that is naturally low in sodium, such as ricotta cheese, fresh mozzarella, or Swiss cheese Low-sodium or reduced-sodium cheese. Cream cheese. Yogurt. Fats and oils Unsalted butter. Unsalted margarine with no trans fat. Vegetable oils such as canola or olive oils. Seasonings and other foods Fresh and dried herbs and spices. Salt-free seasonings. Low-sodium mustard and ketchup. Sodium-free salad dressing. Sodium-free light mayonnaise. Fresh or refrigerated horseradish. Lemon juice. Vinegar. Homemade, reduced-sodium, or low-sodium soups. Unsalted popcorn and pretzels. Low-salt or salt-free chips. What foods are  are not recommended? The items listed may not be a complete list. Talk with your dietitian about what dietary choices are best for you. Grains Instant hot cereals. Bread stuffing, pancake, and biscuit mixes. Croutons. Seasoned rice or pasta mixes. Noodle soup cups. Boxed or frozen macaroni and cheese. Regular salted crackers. Self-rising flour. Vegetables Sauerkraut, pickled vegetables, and relishes. Olives. French fries. Onion rings. Regular canned vegetables (not low-sodium or reduced-sodium). Regular canned tomato sauce and paste (not  low-sodium or reduced-sodium). Regular tomato and vegetable juice (not low-sodium or reduced-sodium). Frozen vegetables in sauces. Meats and other protein foods Meat or fish that is salted, canned, smoked, spiced, or pickled. Bacon, ham, sausage, hotdogs, corned beef, chipped beef, packaged lunch meats, salt pork, jerky, pickled herring, anchovies, regular canned tuna, sardines, salted nuts. Dairy Processed cheese and cheese spreads. Cheese curds. Blue cheese. Feta cheese. String cheese. Regular cottage cheese. Buttermilk. Canned milk. Fats and oils Salted butter. Regular margarine. Ghee. Bacon fat. Seasonings and other foods Onion salt, garlic salt, seasoned salt, table salt, and sea salt. Canned and packaged gravies. Worcestershire sauce. Tartar sauce. Barbecue sauce. Teriyaki sauce. Soy sauce, including reduced-sodium. Steak sauce. Fish sauce. Oyster sauce. Cocktail sauce. Horseradish that you find on the shelf. Regular ketchup and mustard. Meat flavorings and tenderizers. Bouillon cubes. Hot sauce and Tabasco sauce. Premade or packaged marinades. Premade or packaged taco seasonings. Relishes. Regular salad dressings. Salsa. Potato and tortilla chips. Corn chips and puffs. Salted popcorn and pretzels. Canned or dried soups. Pizza. Frozen entrees and pot pies. Summary  Eating less sodium can help lower your blood pressure, reduce swelling, and protect your heart, liver, and kidneys.  Most people on this plan should limit their sodium intake to 1,500-2,000 mg (milligrams) of sodium each day.  Canned, boxed, and frozen foods are high in sodium. Restaurant foods, fast foods, and pizza are also very high in sodium. You also get sodium by adding salt to food.  Try to cook at home, eat more fresh fruits and vegetables, and eat less fast food, canned, processed, or prepared foods. This information is not intended to replace advice given to you by your health care provider. Make sure you discuss any  questions you have with your health care provider. Document Released: 06/04/2002 Document Revised: 11/25/2017 Document Reviewed: 12/06/2016 Elsevier Patient Education  2020 Elsevier Inc.  

## 2019-07-13 ENCOUNTER — Other Ambulatory Visit: Payer: 59

## 2019-10-09 ENCOUNTER — Other Ambulatory Visit: Payer: Self-pay | Admitting: Internal Medicine

## 2019-10-09 DIAGNOSIS — F419 Anxiety disorder, unspecified: Secondary | ICD-10-CM

## 2019-10-09 MED ORDER — BUSPIRONE HCL 10 MG PO TABS: 10.0000 mg | ORAL_TABLET | Freq: Two times a day (BID) | ORAL | 3 refills | Status: DC

## 2019-10-16 ENCOUNTER — Other Ambulatory Visit: Payer: Self-pay | Admitting: Internal Medicine

## 2019-10-16 DIAGNOSIS — E119 Type 2 diabetes mellitus without complications: Secondary | ICD-10-CM

## 2019-10-16 MED ORDER — ATORVASTATIN CALCIUM 40 MG PO TABS: 40.0000 mg | ORAL_TABLET | Freq: Every day | ORAL | 3 refills | Status: DC

## 2019-10-16 MED ORDER — LISINOPRIL 10 MG PO TABS: 10.0000 mg | ORAL_TABLET | Freq: Every day | ORAL | 3 refills | Status: DC

## 2020-01-08 ENCOUNTER — Other Ambulatory Visit: Payer: Self-pay

## 2020-01-09 ENCOUNTER — Ambulatory Visit (INDEPENDENT_AMBULATORY_CARE_PROVIDER_SITE_OTHER): Payer: 59 | Admitting: Internal Medicine

## 2020-01-09 VITALS — BP 129/83 | Ht 72.0 in | Wt 220.0 lb

## 2020-01-09 DIAGNOSIS — Z1329 Encounter for screening for other suspected endocrine disorder: Secondary | ICD-10-CM

## 2020-01-09 DIAGNOSIS — Z0000 Encounter for general adult medical examination without abnormal findings: Secondary | ICD-10-CM

## 2020-01-09 DIAGNOSIS — R197 Diarrhea, unspecified: Secondary | ICD-10-CM | POA: Diagnosis not present

## 2020-01-09 DIAGNOSIS — E119 Type 2 diabetes mellitus without complications: Secondary | ICD-10-CM | POA: Diagnosis not present

## 2020-01-09 NOTE — Progress Notes (Signed)
Virtual Visit via Video Note  I connected with Jorge Wright  on 01/09/20 at  8:35 AM EST by a video enabled telemedicine application and verified that I am speaking with the correct person using two identifiers.  Location patient: home Location provider:work or home office Persons participating in the virtual visit: patient, provider  I discussed the limitations of evaluation and management by telemedicine and the availability of in person appointments. The patient expressed understanding and agreed to proceed.   HPI: 1. DM 2 A1c 5.9 07/03/19 on metformin IR 536 mg qd and trulicity 1.5 mg qweek  2. Diarrhea x 6 weeks no exposure to covid or sick contacts or abx use. He is lactose intolerant doing keto diet and cream cheese which he is not sure if aggravating his stomach. He has 5-6 episodes qd with abdominal cramps  3. Skin rash to trunk resolved after 1 year   ROS: See pertinent positives and negatives per HPI.  Past Medical History:  Diagnosis Date  . Chicken pox   . Depression   . Diabetes mellitus without complication ([place])   . History of alcohol abuse   . Hyperlipidemia   . Hypertension     Past Surgical History:  Procedure Laterality Date  . MANDIBLE FRACTURE SURGERY      Family History  Problem Relation Age of Onset  . Alcohol abuse Mother   . Hypertension Mother   . Heart disease Mother   . Diabetes Mother   . Heart attack Mother 14  . Alcohol abuse Father   . Hypertension Father   . Diabetes Father   . Alcohol abuse Maternal Grandmother   . Heart disease Maternal Grandmother   . Hypertension Maternal Grandmother   . Stroke Paternal Grandfather     SOCIAL HX:  Works IT  Married  Kids daughter [name]   Current Outpatient Medications:  .  atorvastatin (LIPITOR) 40 MG tablet, Take 1 tablet (40 mg total) by mouth daily. At night, Disp: 90 tablet, Rfl: 3 .  blood glucose meter kit and supplies KIT, Dispense based on patient and insurance preference. Use up  to four times daily as directed., Disp: 1 each, Rfl: 0 .  busPIRone (BUSPAR) 10 MG tablet, Take 1 tablet (10 mg total) by mouth 2 (two) times daily., Disp: 180 tablet, Rfl: 3 .  Dulaglutide (TRULICITY) 1.5 UY/4.0HK SOPN, INJECT 1.5 MG UNDER THE SKIN ONCE A WEEK, Disp: 18 mL, Rfl: 3 .  escitalopram (LEXAPRO) 20 MG tablet, Take 1 tablet (20 mg total) by mouth daily., Disp: 90 tablet, Rfl: 3 .  lisinopril (ZESTRIL) 10 MG tablet, Take 1 tablet (10 mg total) by mouth daily., Disp: 90 tablet, Rfl: 3 .  ONE TOUCH ULTRA TEST test strip, , Disp: , Rfl:  .  ONETOUCH DELICA LANCETS 74Q MISC, , Disp: , Rfl:  .  triamcinolone cream (KENALOG) 0.1 %, Apply 1 application topically 2 (two) times daily. X 2 weeks, Disp: 454 g, Rfl: 0  EXAM:  VITALS per patient if applicable:  GENERAL: alert, oriented, appears well and in no acute distress  HEENT: atraumatic, conjunttiva clear, no obvious abnormalities on inspection of external nose and ears  NECK: normal movements of the head and neck  LUNGS: on inspection no signs of respiratory distress, breathing rate appears normal, no obvious gross SOB, gasping or wheezing  CV: no obvious cyanosis  MS: moves all visible extremities without noticeable abnormality  PSYCH/NEURO: pleasant and cooperative, no obvious depression or anxiety, speech and thought processing  grossly intact  ASSESSMENT AND PLAN:  Discussed the following assessment and plan:  Type 2 diabetes mellitus without complication, without long-term current use of insulin ([facility]) - Plan: Lipid panel, Comprehensive metabolic panel, CBC with Differential/Platelet, HgB A1c, Urinalysis, Routine w reflex microscopic, Microalbumin / creatinine urine ratio -stop metformin 500 mg qd continue trulicity 1.5 Qweek   Diarrhea likely due to cheese with lactose intolerance and metformin IR  rec bland diet  Align/renew probiotics  Stay hydrated  Let me know in 2 weeks  Stop metformin    HM Flu shotutd, wants  to get for 2020/21 will schedule Tdap due 2023  Immunehep B immune, consider twinrix h/o fatty liver -consider hep A vaccine appt sch 01/10/20 2nd dose in 49mo to 1 year  MMRimmune Had pna utdconsider repeat 07/2019   D3 5000 IU daily and mvt rec Fasting labs asap   -we discussed possible serious and likely etiologies, options for evaluation and workup, limitations of telemedicine visit vs in person visit, treatment, treatment risks and precautions. Pt prefers to treat via telemedicine empirically rather then risking or undertaking an in person visit at this moment. Patient agrees to seek prompt in person care if worsening, new symptoms arise, or if is not improving with treatment.   I discussed the assessment and treatment plan with the patient. The patient was provided an opportunity to ask questions and all were answered. The patient agreed with the plan and demonstrated an understanding of the instructions.   The patient was advised to call back or seek an in-person evaluation if the symptoms worsen or if the condition fails to improve as anticipated.  Time spent 20 minutes TDelorise Jackson MD

## 2020-01-10 ENCOUNTER — Ambulatory Visit (INDEPENDENT_AMBULATORY_CARE_PROVIDER_SITE_OTHER): Payer: 59 | Admitting: Lab

## 2020-01-10 ENCOUNTER — Other Ambulatory Visit: Payer: Self-pay

## 2020-01-10 DIAGNOSIS — Z23 Encounter for immunization: Secondary | ICD-10-CM | POA: Diagnosis not present

## 2020-01-10 NOTE — Progress Notes (Addendum)
Pt in office today for Hep A injection in L- Deltoid. Pt tolerated well.  Agree  tms

## 2020-04-14 LAB — HM DIABETES EYE EXAM

## 2020-04-22 ENCOUNTER — Encounter: Payer: Self-pay | Admitting: Internal Medicine

## 2020-04-24 ENCOUNTER — Other Ambulatory Visit: Payer: Self-pay | Admitting: Internal Medicine

## 2020-04-24 DIAGNOSIS — E119 Type 2 diabetes mellitus without complications: Secondary | ICD-10-CM

## 2020-04-24 MED ORDER — TRULICITY 1.5 MG/0.5ML ~~LOC~~ SOAJ: SUBCUTANEOUS | 11 refills | Status: DC

## 2020-05-11 ENCOUNTER — Encounter: Payer: Self-pay | Admitting: Internal Medicine

## 2020-05-13 ENCOUNTER — Other Ambulatory Visit: Payer: Self-pay | Admitting: Internal Medicine

## 2020-05-13 DIAGNOSIS — F32A Depression, unspecified: Secondary | ICD-10-CM

## 2020-05-13 DIAGNOSIS — F419 Anxiety disorder, unspecified: Secondary | ICD-10-CM

## 2020-05-13 MED ORDER — ESCITALOPRAM OXALATE 20 MG PO TABS: 20.0000 mg | ORAL_TABLET | Freq: Every day | ORAL | 3 refills | Status: DC

## 2020-05-13 MED ORDER — ESCITALOPRAM OXALATE 20 MG PO TABS: 20.0000 mg | ORAL_TABLET | Freq: Every day | ORAL | 0 refills | Status: DC

## 2020-06-03 ENCOUNTER — Encounter: Payer: Self-pay | Admitting: Internal Medicine

## 2020-06-03 ENCOUNTER — Other Ambulatory Visit: Payer: Self-pay

## 2020-06-03 ENCOUNTER — Ambulatory Visit (INDEPENDENT_AMBULATORY_CARE_PROVIDER_SITE_OTHER): Payer: 59 | Admitting: Internal Medicine

## 2020-06-03 VITALS — BP 140/102 | HR 75 | Temp 98.0°F | Ht 72.01 in | Wt 226.0 lb

## 2020-06-03 DIAGNOSIS — E119 Type 2 diabetes mellitus without complications: Secondary | ICD-10-CM

## 2020-06-03 DIAGNOSIS — E1159 Type 2 diabetes mellitus with other circulatory complications: Secondary | ICD-10-CM

## 2020-06-03 DIAGNOSIS — F339 Major depressive disorder, recurrent, unspecified: Secondary | ICD-10-CM | POA: Insufficient documentation

## 2020-06-03 DIAGNOSIS — F32A Depression, unspecified: Secondary | ICD-10-CM

## 2020-06-03 DIAGNOSIS — F419 Anxiety disorder, unspecified: Secondary | ICD-10-CM | POA: Diagnosis not present

## 2020-06-03 DIAGNOSIS — I152 Hypertension secondary to endocrine disorders: Secondary | ICD-10-CM

## 2020-06-03 DIAGNOSIS — F329 Major depressive disorder, single episode, unspecified: Secondary | ICD-10-CM

## 2020-06-03 DIAGNOSIS — I10 Essential (primary) hypertension: Secondary | ICD-10-CM

## 2020-06-03 MED ORDER — LISINOPRIL 20 MG PO TABS: 20.0000 mg | ORAL_TABLET | Freq: Every day | ORAL | 3 refills | Status: DC

## 2020-06-03 MED ORDER — LORAZEPAM 0.5 MG PO TABS: 0.5000 mg | ORAL_TABLET | Freq: Two times a day (BID) | ORAL | 2 refills | Status: DC | PRN

## 2020-06-03 NOTE — Progress Notes (Signed)
Chief Complaint  Patient presents with  . Depression    Pt c/o Worsening depression and anxiety. States he is currently fighting a panic attack and was dealing with high anxiety through the weekend   F/u  1. Worsening anxiety and depression phq 9 score 22 and GAD 7 21 today on buspar 10 tid and lexapro 20 mg tried wellbutrin in the past caused weird dreams for him and his mom he tried walking 3.5 miles 5/7 days per week but not helping and anxiety making mood worse at times room spinning feels hopeless like the room is closing in on him denies SI. He has fear of dying and increased panic. He did do therapy in late teens early 3s w/o help   2. HTN elevated not monitoring salt in take on lis 10 mg qd disc dash diet   Review of Systems  Constitutional: Negative for weight loss.  Eyes: Negative for blurred vision.  Respiratory: Negative for shortness of breath.   Cardiovascular: Negative for chest pain.  Skin: Negative for rash.  Neurological: Positive for dizziness.  Psychiatric/Behavioral: Positive for depression. The patient is nervous/anxious.    Past Medical History:  Diagnosis Date  . Chicken pox   . Depression   . Diabetes mellitus without complication ([place])   . History of alcohol abuse   . Hyperlipidemia   . Hypertension    Past Surgical History:  Procedure Laterality Date  . MANDIBLE FRACTURE SURGERY     Family History  Problem Relation Age of Onset  . Alcohol abuse Mother   . Hypertension Mother   . Heart disease Mother   . Diabetes Mother   . Heart attack Mother 90  . Alcohol abuse Father   . Hypertension Father   . Diabetes Father   . Alcohol abuse Maternal Grandmother   . Heart disease Maternal Grandmother   . Hypertension Maternal Grandmother   . Stroke Paternal Grandfather    Social History   Socioeconomic History  . Marital status: Married    Spouse name: Not on file  . Number of children: Not on file  . Years of education: Not on file  . Highest  education level: Not on file  Occupational History  . Not on file  Tobacco Use  . Smoking status: Former Research scientist (life sciences)  . Smokeless tobacco: Former Network engineer and Sexual Activity  . Alcohol use: Yes    Alcohol/week: 6.0 standard drinks    Types: 6 Standard drinks or equivalent per week  . Drug use: Yes    Types: Marijuana  . Sexual activity: Not on file  Other Topics Concern  . Not on file  Social History Narrative   Works IT    Married    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, dispensing    Social Determinants of Radio broadcast assistant Strain:   . Difficulty of Paying Living Expenses:   Food Insecurity:   . Worried About Charity fundraiser in the Last Year:   . Arboriculturist in the Last Year:   Transportation Needs:   . Film/video editor (Medical):   Marland Kitchen Lack of Transportation (Non-Medical):   Physical Activity:   . Days of Exercise per Week:   . Minutes of Exercise per Session:   Stress:   . Feeling of Stress :   Social Connections:   . Frequency of Communication with Friends and Family:   . Frequency of Social Gatherings with Friends and Family:   . Attends Religious Services:   .  Active Member of Clubs or Organizations:   . Attends Archivist Meetings:   Marland Kitchen Marital Status:   Intimate Partner Violence:   . Fear of Current or Ex-Partner:   . Emotionally Abused:   Marland Kitchen Physically Abused:   . Sexually Abused:    Current Meds  Medication Sig  . atorvastatin (LIPITOR) 40 MG tablet Take 1 tablet (40 mg total) by mouth daily. At night  . blood glucose meter kit and supplies KIT Dispense based on patient and insurance preference. Use up to four times daily as directed.  . busPIRone (BUSPAR) 10 MG tablet Take 1 tablet (10 mg total) by mouth 2 (two) times daily.  . Dulaglutide (TRULICITY) 1.5 ID/4.3BD SOPN INJECT 1.5 MG UNDER THE SKIN ONCE A WEEK  . escitalopram (LEXAPRO) 20 MG tablet Take 1 tablet (20 mg total) by mouth daily.  Marland Kitchen escitalopram (LEXAPRO) 20 MG tablet Take 1 tablet  (20 mg total) by mouth daily.  Marland Kitchen lisinopril (ZESTRIL) 20 MG tablet Take 1 tablet (20 mg total) by mouth daily.  . ONE TOUCH ULTRA TEST test strip   . ONETOUCH DELICA LANCETS 57I MISC   . [DISCONTINUED] lisinopril (ZESTRIL) 10 MG tablet Take 1 tablet (10 mg total) by mouth daily.   Allergies  Allergen Reactions  . Invokana [Canagliflozin] Rash  . Penicillins Rash   Recent Results (from the past 2160 hour(s))  [facility] DIABETES EYE EXAM     Status: None   Collection Time: 04/14/20 12:00 AM  Result Value Ref Range   [facility] Diabetic Eye Exam No Retinopathy No Retinopathy    Comment: Dr. Kerin Ransom 04/14/20   Objective  Body mass index is 30.64 kg/m. Wt Readings from Last 3 Encounters:  06/03/20 226 lb (102.5 kg)  01/09/20 220 lb (99.8 kg)  12/29/18 233 lb 6.4 oz (105.9 kg)   Temp Readings from Last 3 Encounters:  06/03/20 98 F (36.7 C)  12/29/18 98.5 F (36.9 C) (Oral)  08/29/18 98.2 F (36.8 C) (Oral)   BP Readings from Last 3 Encounters:  06/03/20 (!) 140/102  01/09/20 129/83  12/29/18 138/78   Pulse Readings from Last 3 Encounters:  06/03/20 75  12/29/18 97  08/29/18 84    Physical Exam Vitals and nursing note reviewed.  Constitutional:      Appearance: Normal appearance. He is well-developed and well-groomed.  HENT:     Head: Normocephalic and atraumatic.  Eyes:     Conjunctiva/sclera: Conjunctivae normal.     Pupils: Pupils are equal, round, and reactive to light.  Cardiovascular:     Rate and Rhythm: Normal rate and regular rhythm.     Heart sounds: Normal heart sounds. No murmur.  Pulmonary:     Effort: Pulmonary effort is normal.     Breath sounds: Normal breath sounds.  Skin:    General: Skin is warm and dry.  Neurological:     General: No focal deficit present.     Mental Status: He is alert and oriented to person, place, and time. Mental status is at baseline.     Gait: Gait normal.  Psychiatric:        Attention and Perception: Attention and perception  normal.        Mood and Affect: Mood and affect normal.        Speech: Speech normal.        Behavior: Behavior normal. Behavior is cooperative.        Thought Content: Thought content normal.  Cognition and Memory: Cognition normal.        Judgment: Judgment normal.     Assessment  Plan  Anxiety and depression, recurrent - Plan: LORazepam (ATIVAN) 0.5 MG tablet  Taper off buspar 10 mg tid to 10 mg bid x 1 week and 10 mg qd x 1 week causing him to feel hot and sweating   Refer to therapy and psych Dr. Nicolasa Ducking urgently  Consider change lexapro 20 to prozac in future  Did not do well with welbutrin in the past   Type 2 diabetes mellitus without complication, without long-term current use of insulin ([place]) - Plan: lisinopril (ZESTRIL) 20 MG tablet increased from 10 mg qd  Consider add hctz in the future combo pill in the future rec dash diet Log BP and let me know in 2 weeks and f/u 06/2020  Provider: Dr. Olivia Mackie McLean-[facility]

## 2020-06-03 NOTE — Patient Instructions (Addendum)
Consider [facility] here  Consider [facility] no referral needed  See who is covered by insurance psychiatry referral and let me know  -Dr. Maryruth Bun   buspar reduce to 2x per day x 1 week then 1x per day x 1 week then stop   Replika, Bloom, Insight timer, Calm, headspace   Could consider prozac   Natural supplements nature made L theanine 100-200 mg as needed anxiety    Lorazepam tablets What is this medicine? LORAZEPAM (lor A ze pam) is a benzodiazepine. It is used to treat anxiety. This medicine may be used for other purposes; ask your health care provider or pharmacist if you have questions. COMMON BRAND NAME(S): Ativan What should I tell my health care provider before I take this medicine? They need to know if you have any of these conditions:  glaucoma  history of drug or alcohol abuse problem  kidney disease  liver disease  lung or breathing disease, like asthma  mental illness  myasthenia gravis  Parkinson's disease  suicidal thoughts, plans, or attempt; a previous suicide attempt by you or a family member  an unusual or allergic reaction to lorazepam, other medicines, foods, dyes, or preservatives  pregnant or trying to get pregnant  breast-feeding How should I use this medicine? Take this medicine by mouth with a glass of water. Follow the directions on the prescription label. Take your medicine at regular intervals. Do not take it more often than directed. Do not stop taking except on your doctor's advice. A special MedGuide will be given to you by the pharmacist with each prescription and refill. Be sure to read this information carefully each time. Talk to your pediatrician regarding the use of this medicine in children. While this drug may be used in children as young as 12 years for selected conditions, precautions do apply. Overdosage: If you think you have taken too much of this medicine contact a poison control center or emergency room at  once. NOTE: This medicine is only for you. Do not share this medicine with others. What if I miss a dose? If you miss a dose, take it as soon as you can. If it is almost time for your next dose, take only that dose. Do not take double or extra doses. What may interact with this medicine? Do not take this medicine with any of the following medications:  narcotic medicines for cough  sodium oxybate This medicine may also interact with the following medications:  alcohol  antihistamines for allergy, cough and cold  certain medicines for anxiety or sleep  certain medicines for depression, like amitriptyline, fluoxetine, sertraline  certain medicines for seizures like carbamazepine, phenobarbital, phenytoin, primidone  general anesthetics like lidocaine, pramoxine, tetracaine  MAOIs like Carbex, Eldepryl, Marplan, Nardil, and Parnate  medicines that relax muscles for surgery  narcotic medicines for pain  phenothiazines like chlorpromazine, mesoridazine, prochlorperazine, thioridazine This list may not describe all possible interactions. Give your health care provider a list of all the medicines, herbs, non-prescription drugs, or dietary supplements you use. Also tell them if you smoke, drink alcohol, or use illegal drugs. Some items may interact with your medicine. What should I watch for while using this medicine? Tell your doctor or health care professional if your symptoms do not start to get better or if they get worse. Do not stop taking except on your doctor's advice. You may develop a severe reaction. Your doctor will tell you how much medicine to take. You may get drowsy  or dizzy. Do not drive, use machinery, or do anything that needs mental alertness until you know how this medicine affects you. To reduce the risk of dizzy and fainting spells, do not stand or sit up quickly, especially if you are an older patient. Alcohol may increase dizziness and drowsiness. Avoid alcoholic  drinks. If you are taking another medicine that also causes drowsiness, you may have more side effects. Give your health care provider a list of all medicines you use. Your doctor will tell you how much medicine to take. Do not take more medicine than directed. Call emergency for help if you have problems breathing or unusual sleepiness. What side effects may I notice from receiving this medicine? Side effects that you should report to your doctor or health care professional as soon as possible:  allergic reactions like skin rash, itching or hives, swelling of the face, lips, or tongue  breathing problems  confusion  loss of balance or coordination  signs and symptoms of low blood pressure like dizziness; feeling faint or lightheaded, falls; unusually weak or tired  suicidal thoughts or other mood changes Side effects that usually do not require medical attention (report to your doctor or health care professional if they continue or are bothersome):  dizziness  headache  nausea, vomiting  tiredness This list may not describe all possible side effects. Call your doctor for medical advice about side effects. You may report side effects to FDA at 1-800-FDA-1088. Where should I keep my medicine? Keep out of the reach of children. This medicine can be abused. Keep your medicine in a safe place to protect it from theft. Do not share this medicine with anyone. Selling or giving away this medicine is dangerous and against the law. This medicine may cause accidental overdose and death if taken by other adults, children, or pets. Mix any unused medicine with a substance like cat litter or coffee grounds. Then throw the medicine away in a sealed container like a sealed bag or a coffee can with a lid. Do not use the medicine after the expiration date. Store at room temperature between 20 and 25 degrees C (68 and 77 degrees F). Protect from light. Keep container tightly closed. NOTE: This sheet is a  summary. It may not cover all possible information. If you have questions about this medicine, talk to your doctor, pharmacist, or health care provider.  2020 Elsevier/Gold Standard (2015-09-11 15:54:27)  Fluoxetine capsules or tablets (PMDD indication) What is this medicine? FLUOXETINE (floo OX e teen) belongs to a class of drugs known as selective serotonin reuptake inhibitors (SSRIs). It is used for premenstrual dysphoric disorder (PMDD). PMDD causes intense mood and physical symptoms a week or two before your period every month. This drug helps improve mood swings, tiredness, tension, and breast tenderness. This medicine may be used for other purposes; ask your health care provider or pharmacist if you have questions. COMMON BRAND NAME(S): Prozac, Sarafem, Selfemra What should I tell my health care provider before I take this medicine? They need to know if you have any of these conditions:  bipolar disorder or a family history of bipolar disorder  bleeding disorders  glaucoma  heart disease  liver disease  low levels of sodium in the blood  seizures  suicidal thoughts, plans, or attempt; a previous suicide attempt by you or a family member  take MAOIs like Carbex, Eldepryl, Marplan, Nardil, and Parnate  take medicines that treat or prevent blood clots  thyroid disease  an  unusual or allergic reaction to fluoxetine, other medicines, foods, dyes, or preservatives  pregnant or trying to get pregnant  breast-feeding  bipolar disorder or a family history of bipolar disorder  bleeding disorders  glaucoma  heart disease  liver disease  low levels of sodium in the blood  seizures  suicidal thoughts, plans, or attempt; a previous suicide attempt by you or a family member  take MAOIs like Carbex, Eldepryl, Marplan, Nardil, and Parnate  take medicines that treat or prevent blood clots  thyroid disease  an unusual or allergic reaction to fluoxetine, other  medicines, foods, dyes, or preservatives  pregnant or trying to get pregnant  breast-feeding How should I use this medicine? Take this medicine by mouth with a glass of water. Follow the directions on the prescription label. You can take it with or without food. Take your medicine at regular intervals. Do not take it more often than directed. Do not stop taking this medicine suddenly except upon the advice of your doctor. Stopping this medicine too quickly may cause serious side effects or your condition may worsen. A special MedGuide will be given to you by the pharmacist with each prescription and refill. Be sure to read this information carefully each time. Talk to your pediatrician regarding the use of this medicine in children. Special care may be needed. Overdosage: If you think you have taken too much of this medicine contact a poison control center or emergency room at once. NOTE: This medicine is only for you. Do not share this medicine with others. What if I miss a dose? If you miss a dose, skip the missed dose and go back to your regular dosing schedule. Do not take double or extra doses. What may interact with this medicine? Do not take this medicine with any of the following medications:  other medicines containing fluoxetine, like Prozac or Symbyax  cisapride  dronedarone  linezolid  MAOIs like Carbex, Eldepryl, Marplan, Nardil, and Parnate  methylene blue (injected into a vein)  pimozide  thioridazine This medicine may also interact with the following medications:  alcohol  amphetamines  aspirin and aspirin-like medicines  carbamazepine  certain medicines for depression, anxiety, or psychotic disturbances  certain medicines for migraine headaches like almotriptan, eletriptan, frovatriptan, naratriptan, rizatriptan, sumatriptan, zolmitriptan  digoxin  diuretics  fentanyl  flecainide  furazolidone  isoniazid  lithium  medicines for  sleep  medicines that treat or prevent blood clots like warfarin, enoxaparin, and dalteparin  NSAIDs, medicines for pain and inflammation, like ibuprofen or naproxen  other medicines that prolong the QT interval (an abnormal heart rhythm)  phenytoin  procarbazine  propafenone  rasagiline  ritonavir  supplements like St. John's wort, kava kava, valerian  tramadol  tryptophan  vinblastine This list may not describe all possible interactions. Give your health care provider a list of all the medicines, herbs, non-prescription drugs, or dietary supplements you use. Also tell them if you smoke, drink alcohol, or use illegal drugs. Some items may interact with your medicine. What should I watch for while using this medicine? Tell your doctor if your symptoms do not get better or if they get worse. Visit your doctor or health care professional for regular checks on your progress. Patients and their families should watch out for new or worsening thoughts of suicide or depression. Also watch out for sudden changes in feelings such as feeling anxious, agitated, panicky, irritable, hostile, aggressive, impulsive, severely restless, overly excited and hyperactive, or not being able to sleep.  If this happens, especially at the beginning of treatment or after a change in dose, call your health care professional. Dennis Bast may get drowsy or dizzy. Do not drive, use machinery, or do anything that needs mental alertness until you know how this medicine affects you. Do not stand or sit up quickly, especially if you are an older patient. This reduces the risk of dizzy or fainting spells. Alcohol may interfere with the effect of this medicine. Avoid alcoholic drinks. Your mouth may get dry. Chewing sugarless gum or sucking hard candy, and drinking plenty of water may help. Contact your doctor if the problem does not go away or is severe. This medicine may affect blood sugar levels. If you have diabetes, check  with your doctor or health care professional before you change your diet or the dose of your diabetic medicine. What side effects may I notice from receiving this medicine? Side effects that you should report to your doctor or health care professional as soon as possible:  allergic reactions like skin rash, itching or hives, swelling of the face, lips, or tongue  anxious  black, tarry stools  breathing problems  changes in vision  confusion  elevated mood, decreased need for sleep, racing thoughts, impulsive behavior  eye pain  fast, irregular heartbeat  feeling faint or lightheaded, falls  feeling agitated, angry, or irritable  hallucination, loss of contact with reality  loss of balance or coordination  loss of memory  restlessness, pacing, inability to keep still  seizures  stiff muscles  suicidal thoughts or other mood changes  trouble sleeping  unusual bleeding or bruising  unusually weak or tired  vomiting Side effects that usually do not require medical attention (report to your doctor or health care professional if they continue or are bothersome):  change in appetite or weight  change in sex drive or performance  diarrhea  dry mouth  headache  increased sweating  indigestion, nausea  tremors This list may not describe all possible side effects. Call your doctor for medical advice about side effects. You may report side effects to FDA at 1-800-FDA-1088. Where should I keep my medicine? Keep out of the reach of children. Store at room temperature between 15 and 30 degrees C (59 and 86 degrees F). Throw away any unused medicine after the expiration date. NOTE: This sheet is a summary. It may not cover all possible information. If you have questions about this medicine, talk to your doctor, pharmacist, or health care provider.  2020 Elsevier/Gold Standard (2018-08-03 12:45:43)   Panic Attack A panic attack is a sudden episode of severe  anxiety, fear, or discomfort that causes physical and emotional symptoms. The attack may be in response to something frightening, or it may occur for no known reason. Symptoms of a panic attack can be similar to symptoms of a heart attack or stroke. It is important to see your health care provider when you have a panic attack so that these conditions can be ruled out. A panic attack is a symptom of another condition. Most panic attacks go away with treatment of the underlying problem. If you have panic attacks often, you may have a condition called panic disorder. What are the causes? A panic attack may be caused by:  An extreme, life-threatening situation, such as a war or natural disaster.  An anxiety disorder, such as post-traumatic stress disorder.  Depression.  Certain medical conditions, including heart problems, neurological conditions, and infections.  Certain over-the-counter and prescription medicines.  Illegal drugs that increase heart rate and blood pressure, such as methamphetamine.  Alcohol.  Supplements that increase anxiety.  Panic disorder. What increases the risk? You are more likely to develop this condition if:  You have an anxiety disorder.  You have another mental health condition.  You take certain medicines.  You use alcohol, illegal drugs, or other substances.  You are under extreme stress.  A life event is causing increased feelings of anxiety and depression. What are the signs or symptoms? A panic attack starts suddenly, usually lasts about 20 minutes, and occurs with one or more of the following:  A pounding heart.  A feeling that your heart is beating irregularly or faster than normal (palpitations).  Sweating.  Trembling or shaking.  Shortness of breath or feeling smothered.  Feeling choked.  Chest pain or discomfort.  Nausea or a strange feeling in your stomach.  Dizziness, feeling lightheaded, or feeling like you might  faint.  Chills or hot flashes.  Numbness or tingling in your lips, hands, or feet.  Feeling confused, or feeling that you are not yourself.  Fear of losing control or being emotionally unstable.  Fear of dying. How is this diagnosed? A panic attack is diagnosed with an assessment by your health care provider. During the assessment your health care provider will ask questions about:  Your history of anxiety, depression, and panic attacks.  Your medical history.  Whether you drink alcohol, use illegal drugs, take supplements, or take medicines. Be honest about your substance use. Your health care provider may also:  Order blood tests or other kinds of tests to rule out serious medical conditions.  Refer you to a mental health professional for further evaluation. How is this treated? Treatment depends on the cause of the panic attack:  If the cause is a medical problem, your health care provider will either treat that problem or refer you to a specialist.  If the cause is emotional, you may be given anti-anxiety medicines or referred to a counselor. These medicines may reduce how often attacks happen, reduce how severe the attacks are, and lower anxiety.  If the cause is a medicine, your health care provider may tell you to stop the medicine, change your dose, or take a different medicine.  If the cause is a drug, treatment may involve letting the drug wear off and taking medicine to help the drug leave your body or to counteract its effects. Attacks caused by drug abuse may continue even if you stop using the drug. Follow these instructions at home:  Take over-the-counter and prescription medicines only as told by your health care provider.  If you feel anxious, limit your caffeine intake.  Take good care of your physical and mental health by: ? Eating a balanced diet that includes plenty of fresh fruits and vegetables, whole grains, lean meats, and low-fat dairy. ? Getting  plenty of rest. Try to get 7-8 hours of uninterrupted sleep each night. ? Exercising regularly. Try to get 30 minutes of physical activity at least 5 days a week. ? Not smoking. Talk to your health care provider if you need help quitting. ? Limiting alcohol intake to no more than 1 drink a day for nonpregnant women and 2 drinks a day for men. One drink equals 12 oz of beer, 5 oz of wine, or 1 oz of hard liquor.  Keep all follow-up visits as told by your health care provider. This is important. Panic attacks may have underlying physical  or emotional problems that take time to accurately diagnose. Contact a health care provider if:  Your symptoms do not improve, or they get worse.  You are not able to take your medicine as prescribed because of side effects. Get help right away if:  You have serious thoughts about hurting yourself or others.  You have symptoms of a panic attack. Do not drive yourself to the hospital. Have someone else drive you or call an ambulance. If you ever feel like you may hurt yourself or others, or you have thoughts about taking your own life, get help right away. You can go to your nearest emergency department or call:  Your local emergency services (911 in the [place]  A suicide crisis helpline, such as the National Suicide Prevention Lifeline at 507-360-8526. This is open 24 hours a day. Summary  A panic attack is a sign of a serious health or mental health condition. Get help right away. Do not drive yourself to the hospital. Have someone else drive you or call an ambulance.  Always see a health care provider to have the reasons for the panic attack correctly diagnosed.  If your panic attack was caused by a physical problem, follow your health care provider's suggestions for medicine, referral to a specialist, and lifestyle changes.  If your panic attack was caused by an emotional problem, follow through with counseling from a qualified mental health  specialist.  If you feel like you may hurt yourself or others, call 911 and get help right away. This information is not intended to replace advice given to you by your health care provider. Make sure you discuss any questions you have with your health care provider. Document Revised: 11/25/2017 Document Reviewed: 01/21/2017 Elsevier Patient Education  2020 [employer]    DASH Eating Plan DASH stands for "Dietary Approaches to Stop Hypertension." The DASH eating plan is a healthy eating plan that has been shown to reduce high blood pressure (hypertension). It may also reduce your risk for type 2 diabetes, heart disease, and stroke. The DASH eating plan may also help with weight loss. What are tips for following this plan?  General guidelines  Avoid eating more than 2,300 mg (milligrams) of salt (sodium) a day. If you have hypertension, you may need to reduce your sodium intake to 1,500 mg a day.  Limit alcohol intake to no more than 1 drink a day for nonpregnant women and 2 drinks a day for men. One drink equals 12 oz of beer, 5 oz of wine, or 1 oz of hard liquor.  Work with your health care provider to maintain a healthy body weight or to lose weight. Ask what an ideal weight is for you.  Get at least 30 minutes of exercise that causes your heart to beat faster (aerobic exercise) most days of the week. Activities may include walking, swimming, or biking.  Work with your health care provider or diet and nutrition specialist (dietitian) to adjust your eating plan to your individual calorie needs. Reading food labels   Check food labels for the amount of sodium per serving. Choose foods with less than 5 percent of the Daily Value of sodium. Generally, foods with less than 300 mg of sodium per serving fit into this eating plan.  To find whole grains, look for the word "whole" as the first word in the ingredient list. Shopping  Buy products labeled as "low-sodium" or "no salt  added."  Buy fresh foods. Avoid canned foods and premade  or frozen meals. Cooking  Avoid adding salt when cooking. Use salt-free seasonings or herbs instead of table salt or sea salt. Check with your health care provider or pharmacist before using salt substitutes.  Do not fry foods. Cook foods using healthy methods such as baking, boiling, grilling, and broiling instead.  Cook with heart-healthy oils, such as olive, canola, soybean, or sunflower oil. Meal planning  Eat a balanced diet that includes: ? 5 or more servings of fruits and vegetables each day. At each meal, try to fill half of your plate with fruits and vegetables. ? Up to 6-8 servings of whole grains each day. ? Less than 6 oz of lean meat, poultry, or fish each day. A 3-oz serving of meat is about the same size as a deck of cards. One egg equals 1 oz. ? 2 servings of low-fat dairy each day. ? A serving of nuts, seeds, or beans 5 times each week. ? Heart-healthy fats. Healthy fats called Omega-3 fatty acids are found in foods such as flaxseeds and coldwater fish, like sardines, salmon, and mackerel.  Limit how much you eat of the following: ? Canned or prepackaged foods. ? Food that is high in trans fat, such as fried foods. ? Food that is high in saturated fat, such as fatty meat. ? Sweets, desserts, sugary drinks, and other foods with added sugar. ? Full-fat dairy products.  Do not salt foods before eating.  Try to eat at least 2 vegetarian meals each week.  Eat more home-cooked food and less restaurant, buffet, and fast food.  When eating at a restaurant, ask that your food be prepared with less salt or no salt, if possible. What foods are recommended? The items listed may not be a complete list. Talk with your dietitian about what dietary choices are best for you. Grains Whole-grain or whole-wheat bread. Whole-grain or whole-wheat pasta. Brown rice. Orpah Cobb. Bulgur. Whole-grain and low-sodium cereals.  Pita bread. Low-fat, low-sodium crackers. Whole-wheat flour tortillas. Vegetables Fresh or frozen vegetables (raw, steamed, roasted, or grilled). Low-sodium or reduced-sodium tomato and vegetable juice. Low-sodium or reduced-sodium tomato sauce and tomato paste. Low-sodium or reduced-sodium canned vegetables. Fruits All fresh, dried, or frozen fruit. Canned fruit in natural juice (without added sugar). Meat and other protein foods Skinless chicken or [place]. Ground chicken or [place]. Pork with fat trimmed off. Fish and seafood. Egg whites. Dried beans, peas, or lentils. Unsalted nuts, nut butters, and seeds. Unsalted canned beans. Lean cuts of beef with fat trimmed off. Low-sodium, lean deli meat. Dairy Low-fat (1%) or fat-free (skim) milk. Fat-free, low-fat, or reduced-fat cheeses. Nonfat, low-sodium ricotta or cottage cheese. Low-fat or nonfat yogurt. Low-fat, low-sodium cheese. Fats and oils Soft margarine without trans fats. Vegetable oil. Low-fat, reduced-fat, or light mayonnaise and salad dressings (reduced-sodium). Canola, safflower, olive, soybean, and sunflower oils. Avocado. Seasoning and other foods Herbs. Spices. Seasoning mixes without salt. Unsalted popcorn and pretzels. Fat-free sweets. What foods are not recommended? The items listed may not be a complete list. Talk with your dietitian about what dietary choices are best for you. Grains Baked goods made with fat, such as croissants, muffins, or some breads. Dry pasta or rice meal packs. Vegetables Creamed or fried vegetables. Vegetables in a cheese sauce. Regular canned vegetables (not low-sodium or reduced-sodium). Regular canned tomato sauce and paste (not low-sodium or reduced-sodium). Regular tomato and vegetable juice (not low-sodium or reduced-sodium). Rosita Fire. Olives. Fruits Canned fruit in a light or heavy syrup. Fried fruit. Fruit in cream  or butter sauce. Meat and other protein foods Fatty cuts of meat. Ribs. Fried  meat. Tomasa Blase. Sausage. Bologna and other processed lunch meats. Salami. Fatback. Hotdogs. Bratwurst. Salted nuts and seeds. Canned beans with added salt. Canned or smoked fish. Whole eggs or egg yolks. Chicken or [place] with skin. Dairy Whole or 2% milk, cream, and half-and-half. Whole or full-fat cream cheese. Whole-fat or sweetened yogurt. Full-fat cheese. Nondairy creamers. Whipped toppings. Processed cheese and cheese spreads. Fats and oils Butter. Stick margarine. Lard. Shortening. Ghee. Bacon fat. Tropical oils, such as coconut, palm kernel, or palm oil. Seasoning and other foods Salted popcorn and pretzels. Onion salt, garlic salt, seasoned salt, table salt, and sea salt. Worcestershire sauce. Tartar sauce. Barbecue sauce. Teriyaki sauce. Soy sauce, including reduced-sodium. Steak sauce. Canned and packaged gravies. Fish sauce. Oyster sauce. Cocktail sauce. Horseradish that you find on the shelf. Ketchup. Mustard. Meat flavorings and tenderizers. Bouillon cubes. Hot sauce and Tabasco sauce. Premade or packaged marinades. Premade or packaged taco seasonings. Relishes. Regular salad dressings. Where to find more information:  National Heart, Lung, and Blood Institute: PopSteam.is  American Heart Association: www.heart.org Summary  The DASH eating plan is a healthy eating plan that has been shown to reduce high blood pressure (hypertension). It may also reduce your risk for type 2 diabetes, heart disease, and stroke.  With the DASH eating plan, you should limit salt (sodium) intake to 2,300 mg a day. If you have hypertension, you may need to reduce your sodium intake to 1,500 mg a day.  When on the DASH eating plan, aim to eat more fresh fruits and vegetables, whole grains, lean proteins, low-fat dairy, and heart-healthy fats.  Work with your health care provider or diet and nutrition specialist (dietitian) to adjust your eating plan to your individual calorie needs. This information is  not intended to replace advice given to you by your health care provider. Make sure you discuss any questions you have with your health care provider. Document Revised: 11/25/2017 Document Reviewed: 12/06/2016 Elsevier Patient Education  2020 [employer].

## 2020-06-17 ENCOUNTER — Ambulatory Visit (INDEPENDENT_AMBULATORY_CARE_PROVIDER_SITE_OTHER): Payer: 59 | Admitting: Psychology

## 2020-06-17 DIAGNOSIS — F331 Major depressive disorder, recurrent, moderate: Secondary | ICD-10-CM | POA: Diagnosis not present

## 2020-06-17 DIAGNOSIS — F410 Panic disorder [episodic paroxysmal anxiety] without agoraphobia: Secondary | ICD-10-CM | POA: Diagnosis not present

## 2020-07-08 ENCOUNTER — Other Ambulatory Visit: Payer: Self-pay

## 2020-07-08 ENCOUNTER — Ambulatory Visit (INDEPENDENT_AMBULATORY_CARE_PROVIDER_SITE_OTHER): Payer: 59 | Admitting: Psychology

## 2020-07-08 ENCOUNTER — Encounter: Payer: Self-pay | Admitting: Internal Medicine

## 2020-07-08 ENCOUNTER — Ambulatory Visit (INDEPENDENT_AMBULATORY_CARE_PROVIDER_SITE_OTHER): Payer: 59 | Admitting: Internal Medicine

## 2020-07-08 VITALS — BP 124/84 | HR 80 | Temp 98.4°F | Ht 72.01 in | Wt 229.0 lb

## 2020-07-08 DIAGNOSIS — R5383 Other fatigue: Secondary | ICD-10-CM

## 2020-07-08 DIAGNOSIS — E559 Vitamin D deficiency, unspecified: Secondary | ICD-10-CM

## 2020-07-08 DIAGNOSIS — F411 Generalized anxiety disorder: Secondary | ICD-10-CM

## 2020-07-08 DIAGNOSIS — F410 Panic disorder [episodic paroxysmal anxiety] without agoraphobia: Secondary | ICD-10-CM

## 2020-07-08 DIAGNOSIS — F331 Major depressive disorder, recurrent, moderate: Secondary | ICD-10-CM | POA: Diagnosis not present

## 2020-07-08 DIAGNOSIS — Z1329 Encounter for screening for other suspected endocrine disorder: Secondary | ICD-10-CM

## 2020-07-08 DIAGNOSIS — E119 Type 2 diabetes mellitus without complications: Secondary | ICD-10-CM

## 2020-07-08 DIAGNOSIS — F419 Anxiety disorder, unspecified: Secondary | ICD-10-CM | POA: Diagnosis not present

## 2020-07-08 DIAGNOSIS — Z0000 Encounter for general adult medical examination without abnormal findings: Secondary | ICD-10-CM | POA: Diagnosis not present

## 2020-07-08 DIAGNOSIS — I10 Essential (primary) hypertension: Secondary | ICD-10-CM

## 2020-07-08 DIAGNOSIS — F339 Major depressive disorder, recurrent, unspecified: Secondary | ICD-10-CM

## 2020-07-08 DIAGNOSIS — I152 Hypertension secondary to endocrine disorders: Secondary | ICD-10-CM

## 2020-07-08 DIAGNOSIS — E1159 Type 2 diabetes mellitus with other circulatory complications: Secondary | ICD-10-CM

## 2020-07-08 DIAGNOSIS — Z23 Encounter for immunization: Secondary | ICD-10-CM | POA: Diagnosis not present

## 2020-07-08 DIAGNOSIS — R748 Abnormal levels of other serum enzymes: Secondary | ICD-10-CM | POA: Insufficient documentation

## 2020-07-08 LAB — CBC WITH DIFFERENTIAL/PLATELET
Basophils Absolute: 0.1 10*3/uL (ref 0.0–0.1)
Basophils Relative: 0.7 % (ref 0.0–3.0)
Eosinophils Absolute: 0.1 10*3/uL (ref 0.0–0.7)
Eosinophils Relative: 0.9 % (ref 0.0–5.0)
HCT: 42.2 % (ref 39.0–52.0)
Hemoglobin: 14 g/dL (ref 13.0–17.0)
Lymphocytes Relative: 25.8 % (ref 12.0–46.0)
Lymphs Abs: 1.9 10*3/uL (ref 0.7–4.0)
MCHC: 33.3 g/dL (ref 30.0–36.0)
MCV: 87.3 fl (ref 78.0–100.0)
Monocytes Absolute: 0.5 10*3/uL (ref 0.1–1.0)
Monocytes Relative: 7.4 % (ref 3.0–12.0)
Neutro Abs: 4.8 10*3/uL (ref 1.4–7.7)
Neutrophils Relative %: 65.2 % (ref 43.0–77.0)
Platelets: 224 10*3/uL (ref 150.0–400.0)
RBC: 4.83 Mil/uL (ref 4.22–5.81)
RDW: 13.8 % (ref 11.5–15.5)
WBC: 7.4 10*3/uL (ref 4.0–10.5)

## 2020-07-08 LAB — LIPID PANEL
Cholesterol: 199 mg/dL (ref 0–200)
HDL: 47.3 mg/dL (ref >39.00)
Total CHOL/HDL Ratio: 4
Triglycerides: 530 mg/dL — ABNORMAL HIGH (ref 0.0–149.0)

## 2020-07-08 LAB — COMPREHENSIVE METABOLIC PANEL
ALT: 32 U/L (ref 0–53)
AST: 22 U/L (ref 0–37)
Albumin: 4.4 g/dL (ref 3.5–5.2)
Alkaline Phosphatase: 58 U/L (ref 39–117)
BUN: 15 mg/dL (ref 6–23)
CO2: 24 mEq/L (ref 19–32)
Calcium: 9 mg/dL (ref 8.4–10.5)
Chloride: 103 mEq/L (ref 96–112)
Creatinine, Ser: 1.05 mg/dL (ref 0.40–1.50)
GFR: 80.28 mL/min (ref >60.00)
Glucose, Bld: 121 mg/dL — ABNORMAL HIGH (ref 70–99)
Potassium: 4.1 mEq/L (ref 3.5–5.1)
Sodium: 136 mEq/L (ref 135–145)
Total Bilirubin: 0.5 mg/dL (ref 0.2–1.2)
Total Protein: 7.2 g/dL (ref 6.0–8.3)

## 2020-07-08 LAB — VITAMIN B12: Vitamin B-12: 187 pg/mL — ABNORMAL LOW (ref 211–911)

## 2020-07-08 LAB — VITAMIN D 25 HYDROXY (VIT D DEFICIENCY, FRACTURES): VITD: 36.58 ng/mL (ref 30.00–100.00)

## 2020-07-08 LAB — HEMOGLOBIN A1C: Hgb A1c MFr Bld: 6.8 % — ABNORMAL HIGH (ref 4.6–6.5)

## 2020-07-08 LAB — LDL CHOLESTEROL, DIRECT: Direct LDL: 87 mg/dL

## 2020-07-08 LAB — TSH: TSH: 1.83 u[IU]/mL (ref 0.35–4.50)

## 2020-07-08 MED ORDER — LISINOPRIL-HYDROCHLOROTHIAZIDE 20-12.5 MG PO TABS: 1.0000 | ORAL_TABLET | Freq: Every day | ORAL | 3 refills | Status: DC

## 2020-07-08 NOTE — Progress Notes (Signed)
Chief Complaint  Patient presents with  . Follow-up  . Depression  . Anxiety   Annual  1. HTN sl improved on lis 20 mg increased dose but still present  2. Recurrent depression/anxiety on lexapro 20 and prn ativan 0.5 which he takes qd prn and helps gad 7 21 and phq9 score 22 he reports drinking 4-8 beers on the weekends only and had 1 f/u therapy and has another today did not call psych yet  3. Dm 2 L3J 5.9 on trulicity 1.5 weekly   Review of Systems  Constitutional: Negative for weight loss.  HENT: Negative for hearing loss.   Eyes: Negative for blurred vision.  Respiratory: Negative for shortness of breath.   Cardiovascular: Negative for chest pain.  Gastrointestinal: Negative for abdominal pain.  Musculoskeletal: Negative for falls.  Skin: Negative for rash.  Neurological: Negative for headaches.  Psychiatric/Behavioral: Positive for depression. The patient is nervous/anxious.    Past Medical History:  Diagnosis Date  . Chicken pox   . Depression   . Diabetes mellitus without complication ([place])   . History of alcohol abuse   . Hyperlipidemia   . Hypertension    Past Surgical History:  Procedure Laterality Date  . MANDIBLE FRACTURE SURGERY     Family History  Problem Relation Age of Onset  . Alcohol abuse Mother   . Hypertension Mother   . Heart disease Mother   . Diabetes Mother   . Heart attack Mother 48  . Alcohol abuse Father   . Hypertension Father   . Diabetes Father   . Alcohol abuse Maternal Grandmother   . Heart disease Maternal Grandmother   . Hypertension Maternal Grandmother   . Stroke Paternal Grandfather    Social History   Socioeconomic History  . Marital status: Married    Spouse name: Not on file  . Number of children: Not on file  . Years of education: Not on file  . Highest education level: Not on file  Occupational History  . Not on file  Tobacco Use  . Smoking status: Former Research scientist (life sciences)  . Smokeless tobacco: Former Network engineer  and Sexual Activity  . Alcohol use: Yes    Alcohol/week: 6.0 standard drinks    Types: 6 Standard drinks or equivalent per week  . Drug use: Yes    Types: Marijuana  . Sexual activity: Not on file  Other Topics Concern  . Not on file  Social History Narrative   Works IT    Married    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, dispensing    Social Determinants of Radio broadcast assistant Strain:   . Difficulty of Paying Living Expenses:   Food Insecurity:   . Worried About Charity fundraiser in the Last Year:   . Arboriculturist in the Last Year:   Transportation Needs:   . Film/video editor (Medical):   Marland Kitchen Lack of Transportation (Non-Medical):   Physical Activity:   . Days of Exercise per Week:   . Minutes of Exercise per Session:   Stress:   . Feeling of Stress :   Social Connections:   . Frequency of Communication with Friends and Family:   . Frequency of Social Gatherings with Friends and Family:   . Attends Religious Services:   . Active Member of Clubs or Organizations:   . Attends Archivist Meetings:   Marland Kitchen Marital Status:   Intimate Partner Violence:   . Fear of Current or Ex-Partner:   .  Emotionally Abused:   Marland Kitchen Physically Abused:   . Sexually Abused:    Current Meds  Medication Sig  . atorvastatin (LIPITOR) 40 MG tablet Take 1 tablet (40 mg total) by mouth daily. At night  . blood glucose meter kit and supplies KIT Dispense based on patient and insurance preference. Use up to four times daily as directed.  . Dulaglutide (TRULICITY) 1.5 RP/5.9YV SOPN INJECT 1.5 MG UNDER THE SKIN ONCE A WEEK  . escitalopram (LEXAPRO) 20 MG tablet Take 1 tablet (20 mg total) by mouth daily.  Marland Kitchen escitalopram (LEXAPRO) 20 MG tablet Take 1 tablet (20 mg total) by mouth daily.  Marland Kitchen LORazepam (ATIVAN) 0.5 MG tablet Take 1 tablet (0.5 mg total) by mouth 2 (two) times daily as needed for anxiety.  . ONE TOUCH ULTRA TEST test strip   . ONETOUCH DELICA LANCETS 85F MISC   . [DISCONTINUED] lisinopril  (ZESTRIL) 20 MG tablet Take 1 tablet (20 mg total) by mouth daily.   Allergies  Allergen Reactions  . Invokana [Canagliflozin] Rash  . Penicillins Rash   Recent Results (from the past 2160 hour(s))  HM DIABETES EYE EXAM     Status: None   Collection Time: 04/14/20 12:00 AM  Result Value Ref Range   HM Diabetic Eye Exam No Retinopathy No Retinopathy    Comment: Dr. Kerin Ransom 04/14/20   Objective  Body mass index is 31.05 kg/m. Wt Readings from Last 3 Encounters:  07/08/20 229 lb (103.9 kg)  06/03/20 226 lb (102.5 kg)  01/09/20 220 lb (99.8 kg)   Temp Readings from Last 3 Encounters:  07/08/20 98.4 F (36.9 C) (Oral)  06/03/20 98 F (36.7 C)  12/29/18 98.5 F (36.9 C) (Oral)   BP Readings from Last 3 Encounters:  07/08/20 124/84  06/03/20 (!) 140/102  01/09/20 129/83   Pulse Readings from Last 3 Encounters:  07/08/20 80  06/03/20 75  12/29/18 97    Physical Exam Vitals and nursing note reviewed.  Constitutional:      Appearance: Normal appearance. He is well-developed and well-groomed.  HENT:     Head: Normocephalic and atraumatic.  Eyes:     Conjunctiva/sclera: Conjunctivae normal.     Pupils: Pupils are equal, round, and reactive to light.  Cardiovascular:     Rate and Rhythm: Normal rate and regular rhythm.     Heart sounds: Normal heart sounds. No murmur heard.   Pulmonary:     Effort: Pulmonary effort is normal.     Breath sounds: Normal breath sounds.  Abdominal:     General: Abdomen is flat. Bowel sounds are normal.     Tenderness: There is no abdominal tenderness.  Skin:    General: Skin is warm and dry.  Neurological:     General: No focal deficit present.     Mental Status: He is alert and oriented to person, place, and time. Mental status is at baseline.     Gait: Gait normal.  Psychiatric:        Attention and Perception: Attention and perception normal.        Mood and Affect: Mood and affect normal.        Speech: Speech normal.         Behavior: Behavior normal. Behavior is cooperative.        Thought Content: Thought content normal.        Cognition and Memory: Cognition and memory normal.        Judgment: Judgment normal.  Assessment  Plan  Well adult exam -  Fasting labs today  Flu shotutd, wants to get for 2020/21 will schedule Tdap due 2023  covid shot 6/1 and 06/17/20  Immunehep B immune, consider twinrix h/o fatty liver -consider hep A vaccineappt sch 01/10/20 2nd dose in 62mo to 1 year  -hep A vaccine 2nd dose given today MMRimmune Had pna utdconsider repeat 07/2019  D3 5000 IU daily and mvt rec Fasting labs asap  rec smoking cessation and etoh cessation   Depression, recurrent (HCC) Anxiety Cont meds f/u therapy and call and sch psych appt rec reduce or stop etoh  Hypertension associated with diabetes ([place] - Plan: lisinopril-hydrochlorothiazide (ZESTORETIC) 20-12.5 MG tablet D/c lis 20 mg qd   Fatigue, unspecified type - check labs   Vitamin D deficiency - Plan: Vitamin D (25 hydroxy)  Type 2 diabetes mellitus without complication, without long-term current use of insulin (HCC) - Plan: Microalbumin / creatinine urine ratio, Urinalysis, Routine w reflex microscopic, HgB A1c, CBC with Differential/Platelet, Comprehensive metabolic panel, Lipid panel Cont meds controlled DM  Foot exam today  Eye exam 04/14/20 neg    Provider: Dr. TOlivia MackieMcLean-Scocuzza-[facility]

## 2020-07-08 NOTE — Patient Instructions (Addendum)
Dr. Maryruth Bun:  [phone] [address]  [place]   Call to schedule appt   Let me now when ready for dermatology     Hypokalemia Hypokalemia means that the amount of potassium in the blood is lower than normal. Potassium is a chemical (electrolyte) that helps regulate the amount of fluid in the body. It also stimulates muscle tightening (contraction) and helps nerves work properly. Normally, most of the body's potassium is inside cells, and only a very small amount is in the blood. Because the amount in the blood is so small, minor changes to potassium levels in the blood can be life-threatening. What are the causes? This condition may be caused by:  Antibiotic medicine.  Diarrhea or vomiting. Taking too much of a medicine that helps you have a bowel movement (laxative) can cause diarrhea and lead to hypokalemia.  Chronic kidney disease (CKD).  Medicines that help the body get rid of excess fluid (diuretics).  Eating disorders, such as bulimia.  Low magnesium levels in the body.  Sweating a lot. What are the signs or symptoms? Symptoms of this condition include:  Weakness.  Constipation.  Fatigue.  Muscle cramps.  Mental confusion.  Skipped heartbeats or irregular heartbeat (palpitations).  Tingling or numbness. How is this diagnosed? This condition is diagnosed with a blood test. How is this treated? This condition may be treated by:  Taking potassium supplements by mouth.  Adjusting the medicines that you take.  Eating more foods that contain a lot of potassium. If your potassium level is very low, you may need to get potassium through an IV and be monitored in the hospital. Follow these instructions at home:   Take over-the-counter and prescription medicines only as told by your health care provider. This includes vitamins and supplements.  Eat a healthy diet. A healthy diet includes fresh fruits and vegetables, whole grains,  healthy fats, and lean proteins.  If instructed, eat more foods that contain a lot of potassium. This includes: ? Nuts, such as peanuts and pistachios. ? Seeds, such as sunflower seeds and pumpkin seeds. ? Peas, lentils, and lima beans. ? Whole grain and bran cereals and breads. ? Fresh fruits and vegetables, such as apricots, avocado, bananas, cantaloupe, kiwi, oranges, tomatoes, asparagus, and potatoes. ? Orange juice. ? Tomato juice. ? Red meats. ? Yogurt.  Keep all follow-up visits as told by your health care provider. This is important. Contact a health care provider if you:  Have weakness that gets worse.  Feel your heart pounding or racing.  Vomit.  Have diarrhea.  Have diabetes (diabetes mellitus) and you have trouble keeping your blood sugar (glucose) in your target range. Get help right away if you:  Have chest pain.  Have shortness of breath.  Have vomiting or diarrhea that lasts for more than 2 days.  Faint. Summary  Hypokalemia means that the amount of potassium in the blood is lower than normal.  This condition is diagnosed with a blood test.  Hypokalemia may be treated by taking potassium supplements, adjusting the medicines that you take, or eating more foods that are high in potassium.  If your potassium level is very low, you may need to get potassium through an IV and be monitored in the hospital. This information is not intended to replace advice given to you by your health care provider. Make sure you discuss any questions you have with your health care provider. Document Revised: 07/26/2018 Document Reviewed: 07/26/2018 Elsevier Patient  Education  2020 Elsevier Inc.  

## 2020-07-08 NOTE — Progress Notes (Signed)
Patient flagged: Current status:  PATIENT IS OVERDUE FOR BMI FOLLOW UP PLAN BMI is estimated to be 31 based on the last recorded weight and height Current status:  OVERDUE FOR HgbA1c Last HgbA1c was done on 07/03/2019

## 2020-07-09 ENCOUNTER — Encounter: Payer: Self-pay | Admitting: Internal Medicine

## 2020-07-09 LAB — URINALYSIS, ROUTINE W REFLEX MICROSCOPIC
Bilirubin Urine: NEGATIVE
Glucose, UA: NEGATIVE
Hgb urine dipstick: NEGATIVE
Ketones, ur: NEGATIVE
Leukocytes,Ua: NEGATIVE
Nitrite: NEGATIVE
Protein, ur: NEGATIVE
Specific Gravity, Urine: 1.013 (ref 1.001–1.03)
pH: 5.5 (ref 5.0–8.0)

## 2020-07-09 LAB — MICROALBUMIN / CREATININE URINE RATIO
Creatinine, Urine: 91 mg/dL (ref 20–320)
Microalb Creat Ratio: 3 mcg/mg creat (ref <30)
Microalb, Ur: 0.3 mg/dL

## 2020-07-13 ENCOUNTER — Encounter: Payer: Self-pay | Admitting: Internal Medicine

## 2020-07-13 DIAGNOSIS — E538 Deficiency of other specified B group vitamins: Secondary | ICD-10-CM | POA: Insufficient documentation

## 2020-07-17 ENCOUNTER — Other Ambulatory Visit: Payer: Self-pay

## 2020-07-17 ENCOUNTER — Ambulatory Visit (INDEPENDENT_AMBULATORY_CARE_PROVIDER_SITE_OTHER): Payer: 59

## 2020-07-17 DIAGNOSIS — E538 Deficiency of other specified B group vitamins: Secondary | ICD-10-CM | POA: Diagnosis not present

## 2020-07-17 MED ORDER — CYANOCOBALAMIN 1000 MCG/ML IJ SOLN
1000.0000 ug | Freq: Once | INTRAMUSCULAR | Status: AC
  Administered 2020-07-17: 1000 ug via INTRAMUSCULAR

## 2020-07-17 NOTE — Progress Notes (Signed)
Jorge Wright presents today for injection per MD orders. B12 injection  administered IM in left Upper Arm. Administration without incident. Patient tolerated well. [name],cma

## 2020-07-22 ENCOUNTER — Ambulatory Visit (INDEPENDENT_AMBULATORY_CARE_PROVIDER_SITE_OTHER): Payer: 59 | Admitting: Psychology

## 2020-07-22 DIAGNOSIS — F411 Generalized anxiety disorder: Secondary | ICD-10-CM | POA: Diagnosis not present

## 2020-07-22 DIAGNOSIS — F331 Major depressive disorder, recurrent, moderate: Secondary | ICD-10-CM | POA: Diagnosis not present

## 2020-07-22 DIAGNOSIS — F410 Panic disorder [episodic paroxysmal anxiety] without agoraphobia: Secondary | ICD-10-CM | POA: Diagnosis not present

## 2020-07-24 ENCOUNTER — Other Ambulatory Visit: Payer: Self-pay

## 2020-07-24 ENCOUNTER — Ambulatory Visit (INDEPENDENT_AMBULATORY_CARE_PROVIDER_SITE_OTHER): Payer: 59

## 2020-07-24 DIAGNOSIS — E538 Deficiency of other specified B group vitamins: Secondary | ICD-10-CM

## 2020-07-24 MED ORDER — CYANOCOBALAMIN 1000 MCG/ML IJ SOLN
1000.0000 ug | Freq: Once | INTRAMUSCULAR | Status: AC
  Administered 2020-07-24: 1000 ug via INTRAMUSCULAR

## 2020-07-24 NOTE — Progress Notes (Signed)
Patient presented for B 12 injection to left deltoid, patient voiced no concerns nor showed any signs of distress during injection. 

## 2020-07-31 ENCOUNTER — Ambulatory Visit: Payer: 59

## 2020-07-31 ENCOUNTER — Telehealth: Payer: Self-pay | Admitting: Internal Medicine

## 2020-07-31 NOTE — Telephone Encounter (Signed)
Faxed [employer] management A Care Consideration diabetes faxed on 07-31-20

## 2020-08-05 ENCOUNTER — Ambulatory Visit (INDEPENDENT_AMBULATORY_CARE_PROVIDER_SITE_OTHER): Payer: 59 | Admitting: Psychology

## 2020-08-05 DIAGNOSIS — F411 Generalized anxiety disorder: Secondary | ICD-10-CM | POA: Diagnosis not present

## 2020-08-05 DIAGNOSIS — F410 Panic disorder [episodic paroxysmal anxiety] without agoraphobia: Secondary | ICD-10-CM | POA: Diagnosis not present

## 2020-08-07 ENCOUNTER — Ambulatory Visit: Payer: 59

## 2020-08-08 ENCOUNTER — Telehealth: Payer: Self-pay | Admitting: Internal Medicine

## 2020-08-08 NOTE — Telephone Encounter (Signed)
I lft vm for pt to call ofc regarding referral to Dr Kapur.  

## 2020-08-19 ENCOUNTER — Ambulatory Visit (INDEPENDENT_AMBULATORY_CARE_PROVIDER_SITE_OTHER): Payer: 59 | Admitting: Psychology

## 2020-08-19 DIAGNOSIS — F410 Panic disorder [episodic paroxysmal anxiety] without agoraphobia: Secondary | ICD-10-CM | POA: Diagnosis not present

## 2020-08-19 DIAGNOSIS — F411 Generalized anxiety disorder: Secondary | ICD-10-CM | POA: Diagnosis not present

## 2020-08-19 DIAGNOSIS — F331 Major depressive disorder, recurrent, moderate: Secondary | ICD-10-CM | POA: Diagnosis not present

## 2020-10-14 ENCOUNTER — Other Ambulatory Visit: Payer: Self-pay

## 2020-10-15 ENCOUNTER — Telehealth: Payer: Self-pay | Admitting: Internal Medicine

## 2020-10-15 NOTE — Telephone Encounter (Signed)
Called the number on denial letter for patient's Trulicity authorization. Gave the medication, provider, and patient information to the prior Auth agent. Also gave the code for Type 2 diabetes as diagnosis.   This has been approved effective 09/15/2020 to 09/2023.

## 2020-10-16 ENCOUNTER — Other Ambulatory Visit: Payer: Self-pay

## 2020-12-09 ENCOUNTER — Other Ambulatory Visit: Payer: Self-pay

## 2020-12-09 ENCOUNTER — Ambulatory Visit (INDEPENDENT_AMBULATORY_CARE_PROVIDER_SITE_OTHER): Payer: 59 | Admitting: Internal Medicine

## 2020-12-09 ENCOUNTER — Encounter: Payer: Self-pay | Admitting: Internal Medicine

## 2020-12-09 VITALS — BP 140/94 | HR 104 | Temp 98.3°F | Ht 72.0 in | Wt 247.4 lb

## 2020-12-09 DIAGNOSIS — I152 Hypertension secondary to endocrine disorders: Secondary | ICD-10-CM

## 2020-12-09 DIAGNOSIS — E1159 Type 2 diabetes mellitus with other circulatory complications: Secondary | ICD-10-CM

## 2020-12-09 DIAGNOSIS — K760 Fatty (change of) liver, not elsewhere classified: Secondary | ICD-10-CM | POA: Diagnosis not present

## 2020-12-09 DIAGNOSIS — F32A Depression, unspecified: Secondary | ICD-10-CM

## 2020-12-09 DIAGNOSIS — Z23 Encounter for immunization: Secondary | ICD-10-CM | POA: Diagnosis not present

## 2020-12-09 DIAGNOSIS — F419 Anxiety disorder, unspecified: Secondary | ICD-10-CM

## 2020-12-09 DIAGNOSIS — E538 Deficiency of other specified B group vitamins: Secondary | ICD-10-CM | POA: Diagnosis not present

## 2020-12-09 MED ORDER — CYANOCOBALAMIN 1000 MCG/ML IJ SOLN: 1000.0000 ug | Freq: Once | INTRAMUSCULAR | 3 refills | Status: AC

## 2020-12-09 MED ORDER — "NEEDLE (DISP) 25G X 1-1/2"" MISC": 1.0000 | 1 refills | Status: DC

## 2020-12-09 MED ORDER — LISINOPRIL-HYDROCHLOROTHIAZIDE 20-12.5 MG PO TABS: 2.0000 | ORAL_TABLET | Freq: Every day | ORAL | 3 refills | Status: DC

## 2020-12-09 NOTE — Patient Instructions (Addendum)
Consider pneumonia 23 vaccine in 01/2021 or after here or at local pharmacy  Goal blood pressure <130/<80  Too low <90/<60    Hypertension, Adult High blood pressure (hypertension) is when the force of blood pumping through the arteries is too strong. The arteries are the blood vessels that carry blood from the heart throughout the body. Hypertension forces the heart to work harder to pump blood and may cause arteries to become narrow or stiff. Untreated or uncontrolled hypertension can cause a heart attack, heart failure, a stroke, kidney disease, and other problems. A blood pressure reading consists of a higher number over a lower number. Ideally, your blood pressure should be below 120/80. The first ("top") number is called the systolic pressure. It is a measure of the pressure in your arteries as your heart beats. The second ("bottom") number is called the diastolic pressure. It is a measure of the pressure in your arteries as the heart relaxes. What are the causes? The exact cause of this condition is not known. There are some conditions that result in or are related to high blood pressure. What increases the risk? Some risk factors for high blood pressure are under your control. The following factors may make you more likely to develop this condition:  Smoking.  Having type 2 diabetes mellitus, high cholesterol, or both.  Not getting enough exercise or physical activity.  Being overweight.  Having too much fat, sugar, calories, or salt (sodium) in your diet.  Drinking too much alcohol. Some risk factors for high blood pressure may be difficult or impossible to change. Some of these factors include:  Having chronic kidney disease.  Having a family history of high blood pressure.  Age. Risk increases with age.  Race. You may be at higher risk if you are African American.  Gender. Men are at higher risk than women before age 48. After age 50, women are at higher risk than  men.  Having obstructive sleep apnea.  Stress. What are the signs or symptoms? High blood pressure may not cause symptoms. Very high blood pressure (hypertensive crisis) may cause:  Headache.  Anxiety.  Shortness of breath.  Nosebleed.  Nausea and vomiting.  Vision changes.  Severe chest pain.  Seizures. How is this diagnosed? This condition is diagnosed by measuring your blood pressure while you are seated, with your arm resting on a flat surface, your legs uncrossed, and your feet flat on the floor. The cuff of the blood pressure monitor will be placed directly against the skin of your upper arm at the level of your heart. It should be measured at least twice using the same arm. Certain conditions can cause a difference in blood pressure between your right and left arms. Certain factors can cause blood pressure readings to be lower or higher than normal for a short period of time:  When your blood pressure is higher when you are in a health care provider's office than when you are at home, this is called white coat hypertension. Most people with this condition do not need medicines.  When your blood pressure is higher at home than when you are in a health care provider's office, this is called masked hypertension. Most people with this condition may need medicines to control blood pressure. If you have a high blood pressure reading during one visit or you have normal blood pressure with other risk factors, you may be asked to:  Return on a different day to have your blood pressure checked  again.  Monitor your blood pressure at home for 1 week or longer. If you are diagnosed with hypertension, you may have other blood or imaging tests to help your health care provider understand your overall risk for other conditions. How is this treated? This condition is treated by making healthy lifestyle changes, such as eating healthy foods, exercising more, and reducing your alcohol  intake. Your health care provider may prescribe medicine if lifestyle changes are not enough to get your blood pressure under control, and if:  Your systolic blood pressure is above 130.  Your diastolic blood pressure is above 80. Your personal target blood pressure may vary depending on your medical conditions, your age, and other factors. Follow these instructions at home: Eating and drinking   Eat a diet that is high in fiber and potassium, and low in sodium, added sugar, and fat. An example eating plan is called the DASH (Dietary Approaches to Stop Hypertension) diet. To eat this way: ? Eat plenty of fresh fruits and vegetables. Try to fill one half of your plate at each meal with fruits and vegetables. ? Eat whole grains, such as whole-wheat pasta, brown rice, or whole-grain bread. Fill about one fourth of your plate with whole grains. ? Eat or drink low-fat dairy products, such as skim milk or low-fat yogurt. ? Avoid fatty cuts of meat, processed or cured meats, and poultry with skin. Fill about one fourth of your plate with lean proteins, such as fish, chicken without skin, beans, eggs, or tofu. ? Avoid pre-made and processed foods. These tend to be higher in sodium, added sugar, and fat.  Reduce your daily sodium intake. Most people with hypertension should eat less than 1,500 mg of sodium a day.  Do not drink alcohol if: ? Your health care provider tells you not to drink. ? You are pregnant, may be pregnant, or are planning to become pregnant.  If you drink alcohol: ? Limit how much you use to:  0-1 drink a day for women.  0-2 drinks a day for men. ? Be aware of how much alcohol is in your drink. In the [place] one drink equals one 12 oz bottle of beer (355 mL), one 5 oz glass of wine (148 mL), or one 1 oz glass of hard liquor (44 mL). Lifestyle   Work with your health care provider to maintain a healthy body weight or to lose weight. Ask what an ideal weight is for  you.  Get at least 30 minutes of exercise most days of the week. Activities may include walking, swimming, or biking.  Include exercise to strengthen your muscles (resistance exercise), such as Pilates or lifting weights, as part of your weekly exercise routine. Try to do these types of exercises for 30 minutes at least 3 days a week.  Do not use any products that contain nicotine or tobacco, such as cigarettes, e-cigarettes, and chewing tobacco. If you need help quitting, ask your health care provider.  Monitor your blood pressure at home as told by your health care provider.  Keep all follow-up visits as told by your health care provider. This is important. Medicines  Take over-the-counter and prescription medicines only as told by your health care provider. Follow directions carefully. Blood pressure medicines must be taken as prescribed.  Do not skip doses of blood pressure medicine. Doing this puts you at risk for problems and can make the medicine less effective.  Ask your health care provider about side effects or reactions  to medicines that you should watch for. Contact a health care provider if you:  Think you are having a reaction to a medicine you are taking.  Have headaches that keep coming back (recurring).  Feel dizzy.  Have swelling in your ankles.  Have trouble with your vision. Get help right away if you:  Develop a severe headache or confusion.  Have unusual weakness or numbness.  Feel faint.  Have severe pain in your chest or abdomen.  Vomit repeatedly.  Have trouble breathing. Summary  Hypertension is when the force of blood pumping through your arteries is too strong. If this condition is not controlled, it may put you at risk for serious complications.  Your personal target blood pressure may vary depending on your medical conditions, your age, and other factors. For most people, a normal blood pressure is less than 120/80.  Hypertension is  treated with lifestyle changes, medicines, or a combination of both. Lifestyle changes include losing weight, eating a healthy, low-sodium diet, exercising more, and limiting alcohol. This information is not intended to replace advice given to you by your health care provider. Make sure you discuss any questions you have with your health care provider. Document Revised: 08/23/2018 Document Reviewed: 08/23/2018 Elsevier Patient Education  2020 [employer].

## 2020-12-09 NOTE — Progress Notes (Addendum)
Chief Complaint  Patient presents with  . Follow-up  . Hypertension  . Immunizations   F/u  1. htn elevated on lis 20-12.5 mg qd he is having mild h/a at times 2. B 12 def doing B12 weekly since 06/2020 will reduce to Q30 days  3. Flu shot given today 4. Depression/anxiety controlled on lexapro 20 mg qd saw therapy x 6 sessions and helped no longer seeing this happens to him Q1-2 years  Review of Systems  Constitutional: Negative for weight loss.  Respiratory: Negative for shortness of breath.   Cardiovascular: Negative for chest pain.  Skin: Negative for rash.  Neurological: Positive for headaches.  Psychiatric/Behavioral: Negative for depression. The patient is not nervous/anxious.    Past Medical History:  Diagnosis Date  . Chicken pox   . Depression   . Diabetes mellitus without complication ([place])   . History of alcohol abuse   . Hyperlipidemia   . Hypertension    Past Surgical History:  Procedure Laterality Date  . MANDIBLE FRACTURE SURGERY     Family History  Problem Relation Age of Onset  . Alcohol abuse Mother   . Hypertension Mother   . Heart disease Mother   . Diabetes Mother   . Heart attack Mother 72  . Alcohol abuse Father   . Hypertension Father   . Diabetes Father   . Alcohol abuse Maternal Grandmother   . Heart disease Maternal Grandmother   . Hypertension Maternal Grandmother   . Depression Maternal Grandmother   . Stroke Paternal Grandfather    Social History   Socioeconomic History  . Marital status: Married    Spouse name: Not on file  . Number of children: Not on file  . Years of education: Not on file  . Highest education level: Not on file  Occupational History  . Not on file  Tobacco Use  . Smoking status: Former Research scientist (life sciences)  . Smokeless tobacco: Former Network engineer and Sexual Activity  . Alcohol use: Yes    Alcohol/week: 6.0 standard drinks    Types: 6 Standard drinks or equivalent per week  . Drug use: Yes    Types: Marijuana   . Sexual activity: Not on file  Other Topics Concern  . 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 atorvastatin (LIPITOR) 40 MG tablet Take 1 tablet (40 mg total) by mouth daily. At night  . blood glucose meter kit and supplies KIT Dispense based on patient and insurance preference. Use up to four times daily as directed.  . Dulaglutide (TRULICITY) 1.5 TK/2.4OX SOPN INJECT 1.5 MG UNDER THE SKIN ONCE A WEEK  . escitalopram (LEXAPRO) 20 MG tablet Take 1 tablet (20 mg total) by mouth daily.  Marland Kitchen LORazepam (ATIVAN) 0.5 MG tablet Take 1 tablet (0.5 mg total) by mouth 2 (two) times daily as needed for anxiety.  . ONE TOUCH ULTRA TEST test strip   . ONETOUCH DELICA LANCETS 73Z MISC   . [DISCONTINUED] escitalopram (LEXAPRO) 20 MG tablet Take 1 tablet (20 mg total) by mouth daily.  . [DISCONTINUED] lisinopril-hydrochlorothiazide (ZESTORETIC)  20-12.5 MG tablet Take 1 tablet by mouth daily. In am   Allergies  Allergen Reactions  . Invokana [Canagliflozin] Rash  . Penicillins Rash   No results found for this or any previous visit (from the past 2160 hour(s)). Objective  Body mass index is 33.55 kg/m. Wt Readings from Last 3 Encounters:  12/09/20 247 lb 6.4 oz (112.2 kg)  07/08/20 229 lb (103.9 kg)  06/03/20 226 lb (102.5 kg)   Temp Readings from Last 3 Encounters:  12/09/20 98.3 F (36.8 C) (Oral)  07/08/20 98.4 F (36.9 C) (Oral)  06/03/20 98 F (36.7 C)   BP Readings from Last 3 Encounters:  12/09/20 (!) 140/94  07/08/20 124/84  06/03/20 (!) 140/102   Pulse Readings from Last 3 Encounters:  12/09/20 (!) 104  07/08/20 80  06/03/20 75     Physical Exam Vitals and nursing note reviewed.  Constitutional:      Appearance: Normal appearance. He is well-developed and well-groomed. He is obese.  HENT:     Head: Normocephalic and atraumatic.  Eyes:     Conjunctiva/sclera: Conjunctivae normal.     Pupils: Pupils are equal, round, and reactive to light.  Cardiovascular:     Rate and Rhythm: Normal rate and regular rhythm.     Heart sounds: Normal heart sounds. No murmur heard.   Pulmonary:     Effort: Pulmonary effort is normal.     Breath sounds: Normal breath sounds.  Skin:    General: Skin is warm and dry.  Neurological:     General: No focal deficit present.     Mental Status: He is alert and oriented to person, place, and time. Mental status is at baseline.     Gait: Gait normal.  Psychiatric:        Attention and Perception: Attention and perception normal.        Mood and Affect: Mood and affect normal.        Speech: Speech normal.        Behavior: Behavior normal. Behavior is cooperative.        Thought Content: Thought content normal.        Cognition and Memory: Cognition and memory normal.        Judgment: Judgment normal.     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 Olivia Mackie McLean-Scocuzza-Internal Medicine

## 2021-02-09 ENCOUNTER — Other Ambulatory Visit (INDEPENDENT_AMBULATORY_CARE_PROVIDER_SITE_OTHER): Payer: 59

## 2021-02-09 ENCOUNTER — Other Ambulatory Visit: Payer: Self-pay

## 2021-02-09 DIAGNOSIS — E1159 Type 2 diabetes mellitus with other circulatory complications: Secondary | ICD-10-CM

## 2021-02-09 DIAGNOSIS — I152 Hypertension secondary to endocrine disorders: Secondary | ICD-10-CM

## 2021-02-09 DIAGNOSIS — K760 Fatty (change of) liver, not elsewhere classified: Secondary | ICD-10-CM | POA: Diagnosis not present

## 2021-02-09 DIAGNOSIS — E538 Deficiency of other specified B group vitamins: Secondary | ICD-10-CM | POA: Diagnosis not present

## 2021-02-09 LAB — CBC WITH DIFFERENTIAL/PLATELET
Basophils Absolute: 0 10*3/uL (ref 0.0–0.1)
Basophils Relative: 0.4 % (ref 0.0–3.0)
Eosinophils Absolute: 0.1 10*3/uL (ref 0.0–0.7)
Eosinophils Relative: 0.7 % (ref 0.0–5.0)
HCT: 39.2 % (ref 39.0–52.0)
Hemoglobin: 13.1 g/dL (ref 13.0–17.0)
Lymphocytes Relative: 31.6 % (ref 12.0–46.0)
Lymphs Abs: 2.2 10*3/uL (ref 0.7–4.0)
MCHC: 33.5 g/dL (ref 30.0–36.0)
MCV: 85.6 fl (ref 78.0–100.0)
Monocytes Absolute: 0.5 10*3/uL (ref 0.1–1.0)
Monocytes Relative: 6.9 % (ref 3.0–12.0)
Neutro Abs: 4.2 10*3/uL (ref 1.4–7.7)
Neutrophils Relative %: 60.4 % (ref 43.0–77.0)
Platelets: 219 10*3/uL (ref 150.0–400.0)
RBC: 4.57 Mil/uL (ref 4.22–5.81)
RDW: 14.6 % (ref 11.5–15.5)
WBC: 7 10*3/uL (ref 4.0–10.5)

## 2021-02-09 LAB — COMPREHENSIVE METABOLIC PANEL
ALT: 37 U/L (ref 0–53)
AST: 23 U/L (ref 0–37)
Albumin: 4.4 g/dL (ref 3.5–5.2)
Alkaline Phosphatase: 42 U/L (ref 39–117)
BUN: 18 mg/dL (ref 6–23)
CO2: 27 mEq/L (ref 19–32)
Calcium: 9.4 mg/dL (ref 8.4–10.5)
Chloride: 101 mEq/L (ref 96–112)
Creatinine, Ser: 1.01 mg/dL (ref 0.40–1.50)
GFR: 96.16 mL/min (ref >60.00)
Glucose, Bld: 136 mg/dL — ABNORMAL HIGH (ref 70–99)
Potassium: 4.4 mEq/L (ref 3.5–5.1)
Sodium: 138 mEq/L (ref 135–145)
Total Bilirubin: 0.5 mg/dL (ref 0.2–1.2)
Total Protein: 7.3 g/dL (ref 6.0–8.3)

## 2021-02-09 LAB — LIPID PANEL
Cholesterol: 205 mg/dL — ABNORMAL HIGH (ref 0–200)
HDL: 43.9 mg/dL (ref >39.00)
NonHDL: 161.51
Total CHOL/HDL Ratio: 5
Triglycerides: 370 mg/dL — ABNORMAL HIGH (ref 0.0–149.0)
VLDL: 74 mg/dL — ABNORMAL HIGH (ref 0.0–40.0)

## 2021-02-09 LAB — LDL CHOLESTEROL, DIRECT: Direct LDL: 100 mg/dL

## 2021-02-09 LAB — VITAMIN B12: Vitamin B-12: 289 pg/mL (ref 211–911)

## 2021-02-09 LAB — HEMOGLOBIN A1C: Hgb A1c MFr Bld: 7.7 % — ABNORMAL HIGH (ref 4.6–6.5)

## 2021-02-10 LAB — HEPATITIS A ANTIBODY, TOTAL: Hepatitis A AB,Total: REACTIVE — AB

## 2021-02-11 ENCOUNTER — Other Ambulatory Visit: Payer: Self-pay | Admitting: Internal Medicine

## 2021-02-11 DIAGNOSIS — I152 Hypertension secondary to endocrine disorders: Secondary | ICD-10-CM

## 2021-02-11 DIAGNOSIS — E1159 Type 2 diabetes mellitus with other circulatory complications: Secondary | ICD-10-CM

## 2021-02-11 DIAGNOSIS — E785 Hyperlipidemia, unspecified: Secondary | ICD-10-CM

## 2021-02-11 DIAGNOSIS — E538 Deficiency of other specified B group vitamins: Secondary | ICD-10-CM

## 2021-02-11 MED ORDER — EZETIMIBE 10 MG PO TABS: 10.0000 mg | ORAL_TABLET | Freq: Every day | ORAL | 3 refills | Status: DC

## 2021-02-11 MED ORDER — CYANOCOBALAMIN 1000 MCG/ML IJ SOLN: 1000.0000 ug | INTRAMUSCULAR | 3 refills | Status: DC

## 2021-02-11 MED ORDER — METFORMIN HCL ER 500 MG PO TB24
1000.0000 mg | ORAL_TABLET | Freq: Every day | ORAL | 3 refills | Status: DC
Start: 2021-02-11 — End: 2021-12-22

## 2021-02-12 ENCOUNTER — Ambulatory Visit (INDEPENDENT_AMBULATORY_CARE_PROVIDER_SITE_OTHER): Payer: 59

## 2021-02-12 ENCOUNTER — Other Ambulatory Visit: Payer: Self-pay

## 2021-02-12 DIAGNOSIS — E538 Deficiency of other specified B group vitamins: Secondary | ICD-10-CM

## 2021-02-12 MED ORDER — CYANOCOBALAMIN 1000 MCG/ML IJ SOLN
1000.0000 ug | Freq: Once | INTRAMUSCULAR | Status: AC
  Administered 2021-02-12: 1000 ug via INTRAMUSCULAR

## 2021-02-12 NOTE — Progress Notes (Signed)
Patient presented for instruction on how to give B 12 injection to himself. Patient voiced no concerns nor showed signs of distress during injection given to himself. He had no questions comments, nor concerns.

## 2021-03-10 ENCOUNTER — Other Ambulatory Visit: Payer: Self-pay

## 2021-03-10 ENCOUNTER — Encounter: Payer: Self-pay | Admitting: Internal Medicine

## 2021-03-10 ENCOUNTER — Ambulatory Visit (INDEPENDENT_AMBULATORY_CARE_PROVIDER_SITE_OTHER): Payer: 59 | Admitting: Internal Medicine

## 2021-03-10 VITALS — BP 122/88 | HR 79 | Temp 97.9°F | Resp 14 | Ht 72.0 in | Wt 248.0 lb

## 2021-03-10 DIAGNOSIS — F419 Anxiety disorder, unspecified: Secondary | ICD-10-CM

## 2021-03-10 DIAGNOSIS — E119 Type 2 diabetes mellitus without complications: Secondary | ICD-10-CM

## 2021-03-10 DIAGNOSIS — E538 Deficiency of other specified B group vitamins: Secondary | ICD-10-CM | POA: Diagnosis not present

## 2021-03-10 DIAGNOSIS — E1159 Type 2 diabetes mellitus with other circulatory complications: Secondary | ICD-10-CM

## 2021-03-10 DIAGNOSIS — E559 Vitamin D deficiency, unspecified: Secondary | ICD-10-CM

## 2021-03-10 DIAGNOSIS — Z1389 Encounter for screening for other disorder: Secondary | ICD-10-CM

## 2021-03-10 DIAGNOSIS — Z1329 Encounter for screening for other suspected endocrine disorder: Secondary | ICD-10-CM

## 2021-03-10 DIAGNOSIS — F339 Major depressive disorder, recurrent, unspecified: Secondary | ICD-10-CM | POA: Diagnosis not present

## 2021-03-10 DIAGNOSIS — I152 Hypertension secondary to endocrine disorders: Secondary | ICD-10-CM

## 2021-03-10 DIAGNOSIS — F32A Depression, unspecified: Secondary | ICD-10-CM

## 2021-03-10 MED ORDER — TRULICITY 1.5 MG/0.5ML ~~LOC~~ SOAJ: SUBCUTANEOUS | 11 refills | Status: DC

## 2021-03-10 MED ORDER — NEEDLE (DISP) 25G X 1-1/2" MISC
1.0000 | 1 refills | Status: DC
Start: 2021-03-10 — End: 2021-07-31

## 2021-03-10 MED ORDER — ESCITALOPRAM OXALATE 20 MG PO TABS: 20.0000 mg | ORAL_TABLET | Freq: Every day | ORAL | 3 refills | Status: DC

## 2021-03-10 MED ORDER — ATORVASTATIN CALCIUM 40 MG PO TABS: 40.0000 mg | ORAL_TABLET | Freq: Every day | ORAL | 3 refills | Status: DC

## 2021-03-10 NOTE — Progress Notes (Signed)
Chief Complaint  Patient presents with  . Follow-up    hypertension   3 month f/u  1. htn sl elevated with dm 2 a1c 01/2021 7.7 added back metformin 500 mg xr qd and trulicity 1.5 weekly  On lipitor 40 mg qhs zetia 10 mg qd  Disc lifestyle changes  No h/a On lis hctz 20-12.5 x 2 pills qd  He does eat salty foods at times   2. Depression/anxiety controlled lexapro 20 mg qd phq 9 socre 4   Review of Systems  Constitutional: Negative for weight loss.  HENT: Negative for hearing loss.   Eyes: Negative for blurred vision.  Respiratory: Negative for shortness of breath.   Cardiovascular: Negative for chest pain.  Musculoskeletal: Negative for falls.  Skin: Negative for rash.  Neurological: Negative for headaches.  Psychiatric/Behavioral: Negative for depression.   Past Medical History:  Diagnosis Date  . Chicken pox   . COVID-19    12/2020 sob mild  . Depression   . Diabetes mellitus without complication ([place])   . History of alcohol abuse   . Hyperlipidemia   . Hypertension    Past Surgical History:  Procedure Laterality Date  . MANDIBLE FRACTURE SURGERY     Family History  Problem Relation Age of Onset  . Alcohol abuse Mother   . Hypertension Mother   . Heart disease Mother        had heart attack 9   . Diabetes Mother        type 2  . Heart attack Mother 4  . Alcohol abuse Father   . Hypertension Father   . Diabetes Father        type 1  . Pancreatitis Father   . Alcohol abuse Maternal Grandmother   . Heart disease Maternal Grandmother        died in 54s   . Hypertension Maternal Grandmother   . Depression Maternal Grandmother   . Stroke Paternal Grandfather   . Heart attack Maternal Uncle        had heart attack    Social History   Socioeconomic History  . Marital status: Married    Spouse name: Not on file  . Number of children: Not on file  . Years of education: Not on file  . Highest education level: Not on file  Occupational History  . Not on  file  Tobacco Use  . Smoking status: Former Research scientist (life sciences)  . Smokeless tobacco: Former Network engineer and Sexual Activity  . Alcohol use: Yes    Alcohol/week: 6.0 standard drinks    Types: 6 Standard drinks or equivalent per week  . Drug use: Yes    Types: Marijuana  . Sexual activity: Not on file  Other Topics Concern  . 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 blood glucose meter kit and supplies KIT Dispense based on patient and insurance preference. Use up to four times daily as directed.  . cyanocobalamin (,VITAMIN B-12,) 1000 MCG/ML injection Inject 1 mL (1,000 mcg total) into the muscle every 30 (thirty) days.  Marland Kitchen  ezetimibe (ZETIA) 10 MG tablet Take 1 tablet (10 mg total) by mouth daily.  Marland Kitchen lisinopril-hydrochlorothiazide (ZESTORETIC) 20-12.5 MG tablet Take 2 tablets by mouth daily. In am  . LORazepam (ATIVAN) 0.5 MG tablet Take 1 tablet (0.5 mg total) by mouth 2 (two) times daily as needed for anxiety.  . metFORMIN (GLUCOPHAGE-XR) 500 MG 24 hr tablet Take 2 tablets (1,000 mg total) by mouth daily with breakfast.  . ONE TOUCH ULTRA TEST test strip   . ONETOUCH DELICA LANCETS 95G MISC   . [DISCONTINUED] atorvastatin (LIPITOR) 40 MG tablet Take 1 tablet (40 mg total) by mouth daily. At night  . [DISCONTINUED] Dulaglutide (TRULICITY) 1.5 LO/7.5IE SOPN INJECT 1.5 MG UNDER THE SKIN ONCE A WEEK  . [DISCONTINUED] escitalopram (LEXAPRO) 20 MG tablet Take 1 tablet (20 mg total) by mouth daily.  . [DISCONTINUED] NEEDLE, DISP, 25 G 25G X 1-1/2" MISC 1 Device by Does not apply route every 30 (thirty) days.   Allergies  Allergen Reactions  . Invokana  [Canagliflozin] Rash  . Penicillins Rash   Recent Results (from the past 2160 hour(s))  Hepatitis A Ab, Total     Status: Abnormal   Collection Time: 02/09/21  8:24 AM  Result Value Ref Range   Hepatitis A AB,Total REACTIVE (A) NON-REACTI    Comment: . For additional information, please refer to  http://education.[facility].com/faq/FAQ202  (This link is being provided for informational/ educational purposes only.) .   Vitamin B12     Status: None   Collection Time: 02/09/21  8:24 AM  Result Value Ref Range   Vitamin B-12 289 211 - 911 pg/mL  Hemoglobin A1c     Status: Abnormal   Collection Time: 02/09/21  8:24 AM  Result Value Ref Range   Hgb A1c MFr Bld 7.7 (H) 4.6 - 6.5 %    Comment: Glycemic Control Guidelines for People with Diabetes:Non Diabetic:  <6%Goal of Therapy: <7%Additional Action Suggested:  >8%   CBC w/Diff     Status: None   Collection Time: 02/09/21  8:24 AM  Result Value Ref Range   WBC 7.0 4.0 - 10.5 K/uL   RBC 4.57 4.22 - 5.81 Mil/uL   Hemoglobin 13.1 13.0 - 17.0 g/dL   HCT 39.2 39.0 - 52.0 %   MCV 85.6 78.0 - 100.0 fl   MCHC 33.5 30.0 - 36.0 g/dL   RDW 14.6 11.5 - 15.5 %   Platelets 219.0 150.0 - 400.0 K/uL   Neutrophils Relative % 60.4 43.0 - 77.0 %   Lymphocytes Relative 31.6 12.0 - 46.0 %   Monocytes Relative 6.9 3.0 - 12.0 %   Eosinophils Relative 0.7 0.0 - 5.0 %   Basophils Relative 0.4 0.0 - 3.0 %   Neutro Abs 4.2 1.4 - 7.7 K/uL   Lymphs Abs 2.2 0.7 - 4.0 K/uL   Monocytes Absolute 0.5 0.1 - 1.0 K/uL   Eosinophils Absolute 0.1 0.0 - 0.7 K/uL   Basophils Absolute 0.0 0.0 - 0.1 K/uL  Lipid panel     Status: Abnormal   Collection Time: 02/09/21  8:24 AM  Result Value Ref Range   Cholesterol 205 (H) 0 - 200 mg/dL    Comment: ATP III Classification       Desirable:  < 200 mg/dL               Borderline High:  200 - 239 mg/dL          High:  > = 240 mg/dL   Triglycerides 370.0 (  H) 0.0 - 149.0 mg/dL    Comment: Normal:  <150 mg/dLBorderline  High:  150 - 199 mg/dL   HDL 43.90 >39.00 mg/dL   VLDL 74.0 (H) 0.0 - 40.0 mg/dL   Total CHOL/HDL Ratio 5     Comment:                Men          Women1/2 Average Risk     3.4          3.3Average Risk          5.0          4.42X Average Risk          9.6          7.13X Average Risk          15.0          11.0                       NonHDL 161.51     Comment: NOTE:  Non-HDL goal should be 30 mg/dL higher than patient's LDL goal (i.e. LDL goal of < 70 mg/dL, would have non-HDL goal of < 100 mg/dL)  Comprehensive metabolic panel     Status: Abnormal   Collection Time: 02/09/21  8:24 AM  Result Value Ref Range   Sodium 138 135 - 145 mEq/L   Potassium 4.4 3.5 - 5.1 mEq/L   Chloride 101 96 - 112 mEq/L   CO2 27 19 - 32 mEq/L   Glucose, Bld 136 (H) 70 - 99 mg/dL   BUN 18 6 - 23 mg/dL   Creatinine, Ser 1.01 0.40 - 1.50 mg/dL   Total Bilirubin 0.5 0.2 - 1.2 mg/dL   Alkaline Phosphatase 42 39 - 117 U/L   AST 23 0 - 37 U/L   ALT 37 0 - 53 U/L   Total Protein 7.3 6.0 - 8.3 g/dL   Albumin 4.4 3.5 - 5.2 g/dL   GFR 96.16 >60.00 mL/min    Comment: Calculated using the CKD-EPI Creatinine Equation (2021)   Calcium 9.4 8.4 - 10.5 mg/dL  LDL cholesterol, direct     Status: None   Collection Time: 02/09/21  8:24 AM  Result Value Ref Range   Direct LDL 100.0 mg/dL    Comment: Optimal:  <100 mg/dLNear or Above Optimal:  100-129 mg/dLBorderline High:  130-159 mg/dLHigh:  160-189 mg/dLVery High:  >190 mg/dL   Objective  Body mass index is 33.63 kg/m. Wt Readings from Last 3 Encounters:  03/10/21 248 lb (112.5 kg)  12/09/20 247 lb 6.4 oz (112.2 kg)  07/08/20 229 lb (103.9 kg)   Temp Readings from Last 3 Encounters:  03/10/21 97.9 F (36.6 C) (Oral)  12/09/20 98.3 F (36.8 C) (Oral)  07/08/20 98.4 F (36.9 C) (Oral)   BP Readings from Last 3 Encounters:  03/10/21 122/88  12/09/20 (!) 140/94  07/08/20 124/84   Pulse Readings from Last 3 Encounters:  03/10/21 79  12/09/20 (!) 104  07/08/20  80    Physical Exam Vitals and nursing note reviewed.  Constitutional:      Appearance: Normal appearance. He is well-developed and well-groomed. He is obese.  HENT:     Head: Normocephalic and atraumatic.  Eyes:     Conjunctiva/sclera: Conjunctivae normal.     Pupils: Pupils are equal, round, and reactive to light.  Cardiovascular:     Rate and Rhythm: Normal rate and regular rhythm.  Heart sounds: Normal heart sounds. No murmur heard.   Pulmonary:     Effort: Pulmonary effort is normal.     Breath sounds: Normal breath sounds.  Skin:    General: Skin is warm and dry.  Neurological:     General: No focal deficit present.     Mental Status: He is alert and oriented to person, place, and time. Mental status is at baseline.     Gait: Gait normal.  Psychiatric:        Attention and Perception: Attention and perception normal.        Mood and Affect: Mood and affect normal.        Speech: Speech normal.        Behavior: Behavior normal. Behavior is cooperative.        Thought Content: Thought content normal.        Cognition and Memory: Cognition and memory normal. Impaired cognition: e11.59.        Judgment: Judgment normal.     Assessment  Plan  Hypertension dbp sl elevated associated with diabetes A1C 7.7  metformin 500 mg xr qd and trulicity 1.5 weekly  On lipitor 40 mg qhs zetia 10 mg qd  Disc lifestyle changes  No h/a On lis hctz 20-12.5 x 2 pills qd, consider norvasc 2.5 mg qd in the future rec healthy diet and exercise Disc fish oil 2000 mg bid  otc  Depression, recurrent ([facility]) Anxiety  On lexapro 20 mg qd   Vitamin B12 deficiency - Plan: NEEDLE, DISP, 25 G 25G X 1-1/2" MISC Q30 days rec   Anxiety and depression - Plan: escitalopram (LEXAPRO) 20 MG tablet   HM-CPE at f/u  Fasting labs 06/2021  Flu shotutd  Tdap due 2023 covid shot 6/1 and 6/22/21consider booster in 6-8 months had booster 3rd dose pfizer will message [place] results  Immunehep  Bimmune, consider twinrix h/o fatty liver -2/2 hep A vaccinereactive  MMRimmune Had pna utdconsider repeat 01/2021  D3 5000 IU dailyand mvt rec rec smoking cessation and etoh cessation  Provider: Dr. Olivia Mackie McLean-Scocuzza-Internal Medicine

## 2021-03-10 NOTE — Patient Instructions (Addendum)
Fish oil high quality with EPA 2000 mg 2x per day with food  Exercise 30 minutes most day of week  Results for Jorge Wright, Jorge Wright (MRN [id number]) as of 03/10/2021 09:18  Ref. Range 07/08/2020 09:00 02/09/2021 08:24  Hemoglobin A1C Latest Ref Range: 4.6 - 6.5 % 6.8 (H) 7.7 (H)   High Cholesterol  High cholesterol is a condition in which the blood has high levels of a white, waxy substance similar to fat (cholesterol). The liver makes all the cholesterol that the body needs. The human body needs small amounts of cholesterol to help build cells. A person gets extra or excess cholesterol from the food that he or she eats. The blood carries cholesterol from the liver to the rest of the body. If you have high cholesterol, deposits (plaques) may build up on the walls of your arteries. Arteries are the blood vessels that carry blood away from your heart. These plaques make the arteries narrow and stiff. Cholesterol plaques increase your risk for heart attack and stroke. Work with your health care provider to keep your cholesterol levels in a healthy range. What increases the risk? The following factors may make you more likely to develop this condition:  Eating foods that are high in animal fat (saturated fat) or cholesterol.  Being overweight.  Not getting enough exercise.  A family history of high cholesterol (familial hypercholesterolemia).  Use of tobacco products.  Having diabetes. What are the signs or symptoms? There are no symptoms of this condition. How is this diagnosed? This condition may be diagnosed based on the results of a blood test.  If you are older than 36 years of age, your health care provider may check your cholesterol levels every 4-6 years.  You may be checked more often if you have high cholesterol or other risk factors for heart disease. The blood test for cholesterol measures:  "Bad" cholesterol, or LDL cholesterol. This is the main type of cholesterol that causes  heart disease. The desired level is less than 100 mg/dL.  "Good" cholesterol, or HDL cholesterol. HDL helps protect against heart disease by cleaning the arteries and carrying the LDL to the liver for processing. The desired level for HDL is 60 mg/dL or higher.  Triglycerides. These are fats that your body can store or burn for energy. The desired level is less than 150 mg/dL.  Total cholesterol. This measures the total amount of cholesterol in your blood and includes LDL, HDL, and triglycerides. The desired level is less than 200 mg/dL. How is this treated? This condition may be treated with:  Diet changes. You may be asked to eat foods that have more fiber and less saturated fats or added sugar.  Lifestyle changes. These may include regular exercise, maintaining a healthy weight, and quitting use of tobacco products.  Medicines. These are given when diet and lifestyle changes have not worked. You may be prescribed a statin medicine to help lower your cholesterol levels. Follow these instructions at home: Eating and drinking  Eat a healthy, balanced diet. This diet includes: ? Daily servings of a variety of fresh, frozen, or canned fruits and vegetables. ? Daily servings of whole grain foods that are rich in fiber. ? Foods that are low in saturated fats and trans fats. These include poultry and fish without skin, lean cuts of meat, and low-fat dairy products. ? A variety of fish, especially oily fish that contain omega-3 fatty acids. Aim to eat fish at least 2 times a week.  Avoid foods and drinks that have added sugar.  Use healthy cooking methods, such as roasting, grilling, broiling, baking, poaching, steaming, and stir-frying. Do not fry your food except for stir-frying.   Lifestyle  Get regular exercise. Aim to exercise for a total of 150 minutes a week. Increase your activity level by doing activities such as gardening, walking, and taking the stairs.  Do not use any products  that contain nicotine or tobacco, such as cigarettes, e-cigarettes, and chewing tobacco. If you need help quitting, ask your health care provider.   General instructions  Take over-the-counter and prescription medicines only as told by your health care provider.  Keep all follow-up visits as told by your health care provider. This is important. Where to find more information  American Heart Association: www.heart.org  National Heart, Lung, and Blood Institute: PopSteam.is Contact a health care provider if:  You have trouble achieving or maintaining a healthy diet or weight.  You are starting an exercise program.  You are unable to stop smoking. Get help right away if:  You have chest pain.  You have trouble breathing.  You have any symptoms of a stroke. "BE FAST" is an easy way to remember the main warning signs of a stroke: ? B - Balance. Signs are dizziness, sudden trouble walking, or loss of balance. ? E - Eyes. Signs are trouble seeing or a sudden change in vision. ? F - Face. Signs are sudden weakness or numbness of the face, or the face or eyelid drooping on one side. ? A - Arms. Signs are weakness or numbness in an arm. This happens suddenly and usually on one side of the body. ? S - Speech. Signs are sudden trouble speaking, slurred speech, or trouble understanding what people say. ? T - Time. Time to call emergency services. Write down what time symptoms started.  You have other signs of a stroke, such as: ? A sudden, severe headache with no known cause. ? Nausea or vomiting. ? Seizure. These symptoms may represent a serious problem that is an emergency. Do not wait to see if the symptoms will go away. Get medical help right away. Call your local emergency services (911 in the [place] Do not drive yourself to the hospital. Summary  Cholesterol plaques increase your risk for heart attack and stroke. Work with your health care provider to keep your cholesterol  levels in a healthy range.  Eat a healthy, balanced diet, get regular exercise, and maintain a healthy weight.  Do not use any products that contain nicotine or tobacco, such as cigarettes, e-cigarettes, and chewing tobacco.  Get help right away if you have any symptoms of a stroke. This information is not intended to replace advice given to you by your health care provider. Make sure you discuss any questions you have with your health care provider. Document Revised: 11/12/2019 Document Reviewed: 11/12/2019 Elsevier Patient Education  2021 Elsevier Inc.  Cholesterol Content in Foods Cholesterol is a waxy, fat-like substance that helps to carry fat in the blood. The body needs cholesterol in small amounts, but too much cholesterol can cause damage to the arteries and heart. Most people should eat less than 200 milligrams (mg) of cholesterol a day. Foods with cholesterol Cholesterol is found in animal-based foods, such as meat, seafood, and dairy. Generally, low-fat dairy and lean meats have less cholesterol than full-fat dairy and fatty meats. The milligrams of cholesterol per serving (mg per serving) of common cholesterol-containing foods are listed below. Meat  and other proteins  Egg - one large whole egg has 186 mg.  Veal shank - 4 oz has 141 mg.  Lean ground [place] (93% lean) - 4 oz has 118 mg.  Fat-trimmed lamb loin - 4 oz has 106 mg.  Lean ground beef (90% lean) - 4 oz has 100 mg.  Lobster - 3.5 oz has 90 mg.  Pork loin chops - 4 oz has 86 mg.  Canned salmon - 3.5 oz has 83 mg.  Fat-trimmed beef top loin - 4 oz has 78 mg.  Frankfurter - 1 frank (3.5 oz) has 77 mg.  Crab - 3.5 oz has 71 mg.  Roasted chicken without skin, white meat - 4 oz has 66 mg.  Light bologna - 2 oz has 45 mg.  Deli-cut [place] - 2 oz has 31 mg.  Canned tuna - 3.5 oz has 31 mg.  Bacon - 1 oz has 29 mg.  Oysters and mussels (raw) - 3.5 oz has 25 mg.  Mackerel - 1 oz has 22 mg.  Trout - 1  oz has 20 mg.  Pork sausage - 1 link (1 oz) has 17 mg.  Salmon - 1 oz has 16 mg.  Tilapia - 1 oz has 14 mg. Dairy  Soft-serve ice cream -  cup (4 oz) has 103 mg.  Whole-milk yogurt - 1 cup (8 oz) has 29 mg.  Cheddar cheese - 1 oz has 28 mg.  American cheese - 1 oz has 28 mg.  Whole milk - 1 cup (8 oz) has 23 mg.  2% milk - 1 cup (8 oz) has 18 mg.  Cream cheese - 1 tablespoon (Tbsp) has 15 mg.  Cottage cheese -  cup (4 oz) has 14 mg.  Low-fat (1%) milk - 1 cup (8 oz) has 10 mg.  Sour cream - 1 Tbsp has 8.5 mg.  Low-fat yogurt - 1 cup (8 oz) has 8 mg.  Nonfat Greek yogurt - 1 cup (8 oz) has 7 mg.  Half-and-half cream - 1 Tbsp has 5 mg. Fats and oils  Cod liver oil - 1 tablespoon (Tbsp) has 82 mg.  Butter - 1 Tbsp has 15 mg.  Lard - 1 Tbsp has 14 mg.  Bacon grease - 1 Tbsp has 14 mg.  Mayonnaise - 1 Tbsp has 5-10 mg.  Margarine - 1 Tbsp has 3-10 mg. Exact amounts of cholesterol in these foods may vary depending on specific ingredients and brands.   Foods without cholesterol Most plant-based foods do not have cholesterol unless you combine them with a food that has cholesterol. Foods without cholesterol include:  Grains and cereals.  Vegetables.  Fruits.  Vegetable oils, such as olive, canola, and sunflower oil.  Legumes, such as peas, beans, and lentils.  Nuts and seeds.  Egg whites.   Summary  The body needs cholesterol in small amounts, but too much cholesterol can cause damage to the arteries and heart.  Most people should eat less than 200 milligrams (mg) of cholesterol a day. This information is not intended to replace advice given to you by your health care provider. Make sure you discuss any questions you have with your health care provider. Document Revised: 05/05/2020 Document Reviewed: 05/05/2020 Elsevier Patient Education  2021 Elsevier Inc.   Cooking With Less [employer] with less salt is one way to reduce the amount of sodium  you get from food. Sodium is one of the elements that make up salt. It is found naturally in  foods and is also added to certain foods. Depending on your condition and overall health, your health care provider or dietitian may recommend that you reduce your sodium intake. Most people should have less than 2,300 milligrams (mg) of sodium each day. If you have high blood pressure (hypertension), you may need to limit your sodium to 1,500 mg each day. Follow the tips below to help reduce your sodium intake. What are tips for eating less sodium? Reading food labels  Check the food label before buying or using packaged ingredients. Always check the label for the serving size and sodium content.  Look for products with no more than 140 mg of sodium in one serving.  Check the % Daily Value column to see what percent of the daily recommended amount of sodium is provided in one serving of the product. Foods with 5% or less in this column are considered low in sodium. Foods with 20% or higher are considered high in sodium.  Do not choose foods with salt as one of the first three ingredients on the ingredients list. If salt is one of the first three ingredients, it usually means the item is high in sodium.   Shopping  Buy sodium-free or low-sodium products. Look for the following words on food labels: ? Low-sodium. ? Sodium-free. ? Reduced-sodium. ? No salt added. ? Unsalted.  Always check the sodium content even if foods are labeled as low-sodium or no salt added.  Buy fresh foods. Cooking  Use herbs, seasonings without salt, and spices as substitutes for salt.  Use sodium-free baking soda when baking.  Grill, braise, or roast foods to add flavor with less salt.  Avoid adding salt to pasta, rice, or hot cereals.  Drain and rinse canned vegetables, beans, and meat before use.  Avoid adding salt when cooking sweets and desserts.  Cook with low-sodium ingredients. What foods are high in  sodium? Vegetables Regular canned vegetables (not low-sodium or reduced-sodium). Sauerkraut, pickled vegetables, and relishes. Olives. [place] fries. Onion rings. Regular canned tomato sauce and paste. Regular tomato and vegetable juice. Frozen vegetables in sauces. Grains Instant hot cereals. Bread stuffing, pancake, and biscuit mixes. Croutons. Seasoned rice or pasta mixes. Noodle soup cups. Boxed or frozen macaroni and cheese. Regular salted crackers. Self-rising flour. Rolls. Bagels. Flour tortillas and wraps. Meats and other proteins Meat or fish that is salted, canned, smoked, cured, spiced, or pickled. This includes bacon, ham, sausages, hot dogs, corned beef, chipped beef, meat loaves, salt pork, jerky, pickled herring, anchovies, regular canned tuna, and sardines. Salted nuts. Dairy Processed cheese and cheese spreads. Cheese curds. Blue cheese. Feta cheese. String cheese. Regular cottage cheese. Buttermilk. Canned milk. The items listed above may not be a complete list of foods high in sodium. Actual amounts of sodium may be different depending on processing. Contact a dietitian for more information. What foods are low in sodium? Fruits Fresh, frozen, or canned fruit with no sauce added. Fruit juice. Vegetables Fresh or frozen vegetables with no sauce added. "No salt added" canned vegetables. "No salt added" tomato sauce and paste. Low-sodium or reduced-sodium tomato and vegetable juice. Grains Noodles, pasta, quinoa, rice. Shredded or puffed wheat or puffed rice. Regular or quick oats (not instant). Low-sodium crackers. Low-sodium bread. Whole-grain bread and whole-grain pasta. Unsalted popcorn. Meats and other proteins Fresh or frozen whole meats, poultry (not injected with sodium), and fish with no sauce added. Unsalted nuts. Dried peas, beans, and lentils without added salt. Unsalted canned beans. Eggs. Unsalted nut  butters. Low-sodium canned tuna or chicken. Dairy Milk. Soy milk.  Yogurt. Low-sodium cheeses, such as Swiss, [address], [place], and [employer]. Sherbet or ice cream (keep to  cup per serving). Cream cheese. Fats and oils Unsalted butter or margarine. Other foods Homemade pudding. Sodium-free baking soda and baking powder. Herbs and spices. Low-sodium seasoning mixes. Beverages Coffee and tea. Carbonated beverages. The items listed above may not be a complete list of foods low in sodium. Actual amounts of sodium may be different depending on processing. Contact a dietitian for more information. What are some salt alternatives when cooking? The following are herbs, seasonings, and spices that can be used instead of salt to flavor your food. Herbs should be fresh or dried. Do not choose packaged mixes. Next to the name of the herb, spice, or seasoning are some examples of foods you can pair it with. Herbs  Bay leaves - Soups, meat and vegetable dishes, and spaghetti sauce.  Basil - [employer], soups, pasta, and fish dishes.  Cilantro - Meat, poultry, and vegetable dishes.  Chili powder - Marinades and Mexican dishes.  Chives - Salad dressings and potato dishes.  Cumin - Mexican dishes, couscous, and meat dishes.  Dill - Fish dishes, sauces, and salads.  Fennel - Meat and vegetable dishes, breads, and cookies.  Garlic (do not use garlic salt) - [place] dishes, meat dishes, salad dressings, and sauces.  Marjoram - Soups, potato dishes, and meat dishes.  Oregano - Pizza and spaghetti sauce.  Parsley - Salads, soups, pasta, and meat dishes.  Rosemary - [place] dishes, salad dressings, soups, and red meats.  Saffron - Fish dishes, pasta, and some poultry dishes.  Sage - Stuffings and sauces.  Tarragon - Fish and Whole Foods.  Thyme - Stuffing, meat, and fish dishes. Seasonings  Lemon juice - Fish dishes, poultry dishes, vegetables, and salads.  Vinegar - Salad dressings, vegetables, and fish dishes. Spices  Cinnamon - Sweet  dishes, such as cakes, cookies, and puddings.  Cloves - Gingerbread, puddings, and marinades for meats.  Curry - Vegetable dishes, fish and poultry dishes, and stir-fry dishes.  Ginger - Vegetable dishes, fish dishes, and stir-fry dishes.  Nutmeg - Pasta, vegetables, poultry, fish dishes, and custard. Summary  Cooking with less salt is one way to reduce the amount of sodium that you get from food.  Buy sodium-free or low-sodium products.  Check the food label before using or buying packaged ingredients.  Use herbs, seasonings without salt, and spices as substitutes for salt in foods. This information is not intended to replace advice given to you by your health care provider. Make sure you discuss any questions you have with your health care provider. Document Revised: 12/05/2019 Document Reviewed: 12/05/2019 Elsevier Patient Education  2021 Elsevier Inc.  DASH Eating Plan DASH stands for Dietary Approaches to Stop Hypertension. The DASH eating plan is a healthy eating plan that has been shown to:  Reduce high blood pressure (hypertension).  Reduce your risk for type 2 diabetes, heart disease, and stroke.  Help with weight loss. What are tips for following this plan? Reading food labels  Check food labels for the amount of salt (sodium) per serving. Choose foods with less than 5 percent of the Daily Value of sodium. Generally, foods with less than 300 milligrams (mg) of sodium per serving fit into this eating plan.  To find whole grains, look for the word "whole" as the first word in the ingredient list. Shopping  Buy products labeled as "  low-sodium" or "no salt added."  Buy fresh foods. Avoid canned foods and pre-made or frozen meals. Cooking  Avoid adding salt when cooking. Use salt-free seasonings or herbs instead of table salt or sea salt. Check with your health care provider or pharmacist before using salt substitutes.  Do not fry foods. Cook foods using healthy  methods such as baking, boiling, grilling, roasting, and broiling instead.  Cook with heart-healthy oils, such as olive, canola, avocado, soybean, or sunflower oil. Meal planning  Eat a balanced diet that includes: ? 4 or more servings of fruits and 4 or more servings of vegetables each day. Try to fill one-half of your plate with fruits and vegetables. ? 6-8 servings of whole grains each day. ? Less than 6 oz (170 g) of lean meat, poultry, or fish each day. A 3-oz (85-g) serving of meat is about the same size as a deck of cards. One egg equals 1 oz (28 g). ? 2-3 servings of low-fat dairy each day. One serving is 1 cup (237 mL). ? 1 serving of nuts, seeds, or beans 5 times each week. ? 2-3 servings of heart-healthy fats. Healthy fats called omega-3 fatty acids are found in foods such as walnuts, flaxseeds, fortified milks, and eggs. These fats are also found in cold-water fish, such as sardines, salmon, and mackerel.  Limit how much you eat of: ? Canned or prepackaged foods. ? Food that is high in trans fat, such as some fried foods. ? Food that is high in saturated fat, such as fatty meat. ? Desserts and other sweets, sugary drinks, and other foods with added sugar. ? Full-fat dairy products.  Do not salt foods before eating.  Do not eat more than 4 egg yolks a week.  Try to eat at least 2 vegetarian meals a week.  Eat more home-cooked food and less restaurant, buffet, and fast food.   Lifestyle  When eating at a restaurant, ask that your food be prepared with less salt or no salt, if possible.  If you drink alcohol: ? Limit how much you use to:  0-1 drink a day for women who are not pregnant.  0-2 drinks a day for men. ? Be aware of how much alcohol is in your drink. In the [place] one drink equals one 12 oz bottle of beer (355 mL), one 5 oz glass of wine (148 mL), or one 1 oz glass of hard liquor (44 mL). General information  Avoid eating more than 2,300 mg of salt a day. If  you have hypertension, you may need to reduce your sodium intake to 1,500 mg a day.  Work with your health care provider to maintain a healthy body weight or to lose weight. Ask what an ideal weight is for you.  Get at least 30 minutes of exercise that causes your heart to beat faster (aerobic exercise) most days of the week. Activities may include walking, swimming, or biking.  Work with your health care provider or dietitian to adjust your eating plan to your individual calorie needs. What foods should I eat? Fruits All fresh, dried, or frozen fruit. Canned fruit in natural juice (without added sugar). Vegetables Fresh or frozen vegetables (raw, steamed, roasted, or grilled). Low-sodium or reduced-sodium tomato and vegetable juice. Low-sodium or reduced-sodium tomato sauce and tomato paste. Low-sodium or reduced-sodium canned vegetables. Grains Whole-grain or whole-wheat bread. Whole-grain or whole-wheat pasta. Brown rice. [place] Cobbatmeal. Quinoa. Bulgur. Whole-grain and low-sodium cereals. Pita bread. Low-fat, low-sodium crackers. Whole-wheat flour tortillas.  Meats and other proteins Skinless chicken or [place]. Ground chicken or [place]. Pork with fat trimmed off. Fish and seafood. Egg whites. Dried beans, peas, or lentils. Unsalted nuts, nut butters, and seeds. Unsalted canned beans. Lean cuts of beef with fat trimmed off. Low-sodium, lean precooked or cured meat, such as sausages or meat loaves. Dairy Low-fat (1%) or fat-free (skim) milk. Reduced-fat, low-fat, or fat-free cheeses. Nonfat, low-sodium ricotta or cottage cheese. Low-fat or nonfat yogurt. Low-fat, low-sodium cheese. Fats and oils Soft margarine without trans fats. Vegetable oil. Reduced-fat, low-fat, or light mayonnaise and salad dressings (reduced-sodium). Canola, safflower, olive, avocado, soybean, and sunflower oils. Avocado. Seasonings and condiments Herbs. Spices. Seasoning mixes without salt. Other foods Unsalted popcorn and  pretzels. Fat-free sweets. The items listed above may not be a complete list of foods and beverages you can eat. Contact a dietitian for more information. What foods should I avoid? Fruits Canned fruit in a light or heavy syrup. Fried fruit. Fruit in cream or butter sauce. Vegetables Creamed or fried vegetables. Vegetables in a cheese sauce. Regular canned vegetables (not low-sodium or reduced-sodium). Regular canned tomato sauce and paste (not low-sodium or reduced-sodium). Regular tomato and vegetable juice (not low-sodium or reduced-sodium). Rosita Fire. Olives. Grains Baked goods made with fat, such as croissants, muffins, or some breads. Dry pasta or rice meal packs. Meats and other proteins Fatty cuts of meat. Ribs. Fried meat. Tomasa Blase. Bologna, salami, and other precooked or cured meats, such as sausages or meat loaves. Fat from the back of a pig (fatback). Bratwurst. Salted nuts and seeds. Canned beans with added salt. Canned or smoked fish. Whole eggs or egg yolks. Chicken or [place] with skin. Dairy Whole or 2% milk, cream, and half-and-half. Whole or full-fat cream cheese. Whole-fat or sweetened yogurt. Full-fat cheese. Nondairy creamers. Whipped toppings. Processed cheese and cheese spreads. Fats and oils Butter. Stick margarine. Lard. Shortening. Ghee. Bacon fat. Tropical oils, such as coconut, palm kernel, or palm oil. Seasonings and condiments Onion salt, garlic salt, seasoned salt, table salt, and sea salt. Worcestershire sauce. Tartar sauce. Barbecue sauce. Teriyaki sauce. Soy sauce, including reduced-sodium. Steak sauce. Canned and packaged gravies. Fish sauce. Oyster sauce. Cocktail sauce. Store-bought horseradish. Ketchup. Mustard. Meat flavorings and tenderizers. Bouillon cubes. Hot sauces. Pre-made or packaged marinades. Pre-made or packaged taco seasonings. Relishes. Regular salad dressings. Other foods Salted popcorn and pretzels. The items listed above may not be a complete list  of foods and beverages you should avoid. Contact a dietitian for more information. Where to find more information  National Heart, Lung, and Blood Institute: PopSteam.is  American Heart Association: www.heart.org  Academy of Nutrition and Dietetics: www.eatright.org  National Kidney Foundation: www.kidney.org Summary  The DASH eating plan is a healthy eating plan that has been shown to reduce high blood pressure (hypertension). It may also reduce your risk for type 2 diabetes, heart disease, and stroke.  When on the DASH eating plan, aim to eat more fresh fruits and vegetables, whole grains, lean proteins, low-fat dairy, and heart-healthy fats.  With the DASH eating plan, you should limit salt (sodium) intake to 2,300 mg a day. If you have hypertension, you may need to reduce your sodium intake to 1,500 mg a day.  Work with your health care provider or dietitian to adjust your eating plan to your individual calorie needs. This information is not intended to replace advice given to you by your health care provider. Make sure you discuss any questions you have with your health care provider.  Document Revised: 11/16/2019 Document Reviewed: 11/16/2019 Elsevier Patient Education  2021 [employer].

## 2021-05-21 ENCOUNTER — Encounter: Payer: Self-pay | Admitting: Internal Medicine

## 2021-05-27 ENCOUNTER — Encounter: Payer: Self-pay | Admitting: Internal Medicine

## 2021-06-19 ENCOUNTER — Encounter: Payer: Self-pay | Admitting: Internal Medicine

## 2021-07-16 NOTE — Telephone Encounter (Signed)
Patient last seen 03/10/21, Please advise

## 2021-07-20 ENCOUNTER — Other Ambulatory Visit: Payer: Self-pay | Admitting: Internal Medicine

## 2021-07-20 DIAGNOSIS — F419 Anxiety disorder, unspecified: Secondary | ICD-10-CM

## 2021-07-20 DIAGNOSIS — F32A Depression, unspecified: Secondary | ICD-10-CM

## 2021-07-20 MED ORDER — LORAZEPAM 0.5 MG PO TABS: 0.5000 mg | ORAL_TABLET | Freq: Every day | ORAL | 2 refills | Status: DC | PRN

## 2021-07-31 ENCOUNTER — Encounter: Payer: Self-pay | Admitting: Internal Medicine

## 2021-07-31 ENCOUNTER — Other Ambulatory Visit: Payer: Self-pay

## 2021-07-31 ENCOUNTER — Ambulatory Visit (INDEPENDENT_AMBULATORY_CARE_PROVIDER_SITE_OTHER): Payer: 59 | Admitting: Internal Medicine

## 2021-07-31 VITALS — BP 140/98 | HR 107 | Temp 98.7°F | Ht 72.01 in | Wt 232.5 lb

## 2021-07-31 DIAGNOSIS — F32A Depression, unspecified: Secondary | ICD-10-CM

## 2021-07-31 DIAGNOSIS — E1159 Type 2 diabetes mellitus with other circulatory complications: Secondary | ICD-10-CM | POA: Diagnosis not present

## 2021-07-31 DIAGNOSIS — I152 Hypertension secondary to endocrine disorders: Secondary | ICD-10-CM | POA: Diagnosis not present

## 2021-07-31 DIAGNOSIS — F339 Major depressive disorder, recurrent, unspecified: Secondary | ICD-10-CM

## 2021-07-31 DIAGNOSIS — E559 Vitamin D deficiency, unspecified: Secondary | ICD-10-CM

## 2021-07-31 DIAGNOSIS — Z0000 Encounter for general adult medical examination without abnormal findings: Secondary | ICD-10-CM | POA: Diagnosis not present

## 2021-07-31 DIAGNOSIS — E538 Deficiency of other specified B group vitamins: Secondary | ICD-10-CM | POA: Diagnosis not present

## 2021-07-31 DIAGNOSIS — F419 Anxiety disorder, unspecified: Secondary | ICD-10-CM

## 2021-07-31 DIAGNOSIS — Z1389 Encounter for screening for other disorder: Secondary | ICD-10-CM

## 2021-07-31 DIAGNOSIS — Z1329 Encounter for screening for other suspected endocrine disorder: Secondary | ICD-10-CM

## 2021-07-31 LAB — COMPREHENSIVE METABOLIC PANEL
ALT: 43 U/L (ref 0–53)
AST: 24 U/L (ref 0–37)
Albumin: 4.7 g/dL (ref 3.5–5.2)
Alkaline Phosphatase: 59 U/L (ref 39–117)
BUN: 17 mg/dL (ref 6–23)
CO2: 23 mEq/L (ref 19–32)
Calcium: 9.4 mg/dL (ref 8.4–10.5)
Chloride: 101 mEq/L (ref 96–112)
Creatinine, Ser: 1.25 mg/dL (ref 0.40–1.50)
GFR: 74.21 mL/min (ref >60.00)
Glucose, Bld: 164 mg/dL — ABNORMAL HIGH (ref 70–99)
Potassium: 4.2 mEq/L (ref 3.5–5.1)
Sodium: 134 mEq/L — ABNORMAL LOW (ref 135–145)
Total Bilirubin: 0.4 mg/dL (ref 0.2–1.2)
Total Protein: 7.4 g/dL (ref 6.0–8.3)

## 2021-07-31 LAB — CBC WITH DIFFERENTIAL/PLATELET
Basophils Absolute: 0 10*3/uL (ref 0.0–0.1)
Basophils Relative: 0.5 % (ref 0.0–3.0)
Eosinophils Absolute: 0 10*3/uL (ref 0.0–0.7)
Eosinophils Relative: 0.5 % (ref 0.0–5.0)
HCT: 39.3 % (ref 39.0–52.0)
Hemoglobin: 13.1 g/dL (ref 13.0–17.0)
Lymphocytes Relative: 27.4 % (ref 12.0–46.0)
Lymphs Abs: 2.2 10*3/uL (ref 0.7–4.0)
MCHC: 33.4 g/dL (ref 30.0–36.0)
MCV: 85.3 fl (ref 78.0–100.0)
Monocytes Absolute: 0.5 10*3/uL (ref 0.1–1.0)
Monocytes Relative: 5.6 % (ref 3.0–12.0)
Neutro Abs: 5.3 10*3/uL (ref 1.4–7.7)
Neutrophils Relative %: 66 % (ref 43.0–77.0)
Platelets: 226 10*3/uL (ref 150.0–400.0)
RBC: 4.61 Mil/uL (ref 4.22–5.81)
RDW: 13.9 % (ref 11.5–15.5)
WBC: 8.1 10*3/uL (ref 4.0–10.5)

## 2021-07-31 LAB — LIPID PANEL
Cholesterol: 156 mg/dL (ref 0–200)
HDL: 38 mg/dL — ABNORMAL LOW (ref >39.00)
NonHDL: 117.79
Total CHOL/HDL Ratio: 4
Triglycerides: 215 mg/dL — ABNORMAL HIGH (ref 0.0–149.0)
VLDL: 43 mg/dL — ABNORMAL HIGH (ref 0.0–40.0)

## 2021-07-31 LAB — LDL CHOLESTEROL, DIRECT: Direct LDL: 93 mg/dL

## 2021-07-31 LAB — VITAMIN B12: Vitamin B-12: 259 pg/mL (ref 211–911)

## 2021-07-31 LAB — VITAMIN D 25 HYDROXY (VIT D DEFICIENCY, FRACTURES): VITD: 29.96 ng/mL — ABNORMAL LOW (ref 30.00–100.00)

## 2021-07-31 LAB — HEMOGLOBIN A1C: Hgb A1c MFr Bld: 8.7 % — ABNORMAL HIGH (ref 4.6–6.5)

## 2021-07-31 LAB — TSH: TSH: 1.23 u[IU]/mL (ref 0.35–5.50)

## 2021-07-31 MED ORDER — "NEEDLE (DISP) 25G X 1"" MISC": 1.0000 | 1 refills | Status: DC

## 2021-07-31 MED ORDER — "SYRINGE/NEEDLE (DISP) 25G X 1"" 10 ML MISC": 1.0000 | 3 refills | Status: DC

## 2021-07-31 NOTE — Patient Instructions (Addendum)
Consider covid 19 4th dose   Make eye exam Dr. Avie Echevaria Cholesterol  High cholesterol is a condition in which the blood has high levels of a white, waxy substance similar to fat (cholesterol). The liver makes all the cholesterol that the body needs. The human body needs small amounts of cholesterol to help build cells. A person gets extra orexcess cholesterol from the food that he or she eats. The blood carries cholesterol from the liver to the rest of the body. If you have high cholesterol, deposits (plaques) may build up on the walls of your arteries. Arteries are the blood vessels that carry blood away from your heart. These plaques make the arteries narrowand stiff. Cholesterol plaques increase your risk for heart attack and stroke. Work withyour health care provider to keep your cholesterol levels in a healthy range. What increases the risk? The following factors may make you more likely to develop this condition: Eating foods that are high in animal fat (saturated fat) or cholesterol. Being overweight. Not getting enough exercise. A family history of high cholesterol (familial hypercholesterolemia). Use of tobacco products. Having diabetes. What are the signs or symptoms? There are no symptoms of this condition. How is this diagnosed? This condition may be diagnosed based on the results of a blood test. If you are older than 36 years of age, your health care provider may check your cholesterol levels every 4-6 years. You may be checked more often if you have high cholesterol or other risk factors for heart disease. The blood test for cholesterol measures: "Bad" cholesterol, or LDL cholesterol. This is the main type of cholesterol that causes heart disease. The desired level is less than 100 mg/dL. "Good" cholesterol, or HDL cholesterol. HDL helps protect against heart disease by cleaning the arteries and carrying the LDL to the liver for processing. The desired level for HDL is 60  mg/dL or higher. Triglycerides. These are fats that your body can store or burn for energy. The desired level is less than 150 mg/dL. Total cholesterol. This measures the total amount of cholesterol in your blood and includes LDL, HDL, and triglycerides. The desired level is less than 200 mg/dL. How is this treated? This condition may be treated with: Diet changes. You may be asked to eat foods that have more fiber and less saturated fats or added sugar. Lifestyle changes. These may include regular exercise, maintaining a healthy weight, and quitting use of tobacco products. Medicines. These are given when diet and lifestyle changes have not worked. You may be prescribed a statin medicine to help lower your cholesterol levels. Follow these instructions at home: Eating and drinking  Eat a healthy, balanced diet. This diet includes: Daily servings of a variety of fresh, frozen, or canned fruits and vegetables. Daily servings of whole grain foods that are rich in fiber. Foods that are low in saturated fats and trans fats. These include poultry and fish without skin, lean cuts of meat, and low-fat dairy products. A variety of fish, especially oily fish that contain omega-3 fatty acids. Aim to eat fish at least 2 times a week. Avoid foods and drinks that have added sugar. Use healthy cooking methods, such as roasting, grilling, broiling, baking, poaching, steaming, and stir-frying. Do not fry your food except for stir-frying.  Lifestyle  Get regular exercise. Aim to exercise for a total of 150 minutes a week. Increase your activity level by doing activities such as gardening, walking, and taking the stairs. Do not use  any products that contain nicotine or tobacco, such as cigarettes, e-cigarettes, and chewing tobacco. If you need help quitting, ask your health care provider.  General instructions Take over-the-counter and prescription medicines only as told by your health care provider. Keep all  follow-up visits as told by your health care provider. This is important. Where to find more information American Heart Association: www.heart.org National Heart, Lung, and Blood Institute: [employer].is Contact a health care provider if: You have trouble achieving or maintaining a healthy diet or weight. You are starting an exercise program. You are unable to stop smoking. Get help right away if: You have chest pain. You have trouble breathing. You have any symptoms of a stroke. "BE FAST" is an easy way to remember the main warning signs of a stroke: B - Balance. Signs are dizziness, sudden trouble walking, or loss of balance. E - Eyes. Signs are trouble seeing or a sudden change in vision. F - Face. Signs are sudden weakness or numbness of the face, or the face or eyelid drooping on one side. A - Arms. Signs are weakness or numbness in an arm. This happens suddenly and usually on one side of the body. S - Speech. Signs are sudden trouble speaking, slurred speech, or trouble understanding what people say. T - Time. Time to call emergency services. Write down what time symptoms started. You have other signs of a stroke, such as: A sudden, severe headache with no known cause. Nausea or vomiting. Seizure. These symptoms may represent a serious problem that is an emergency. Do not wait to see if the symptoms will go away. Get medical help right away. Call your local emergency services (911 in the [place] Do not drive yourself to the hospital. Summary Cholesterol plaques increase your risk for heart attack and stroke. Work with your health care provider to keep your cholesterol levels in a healthy range. Eat a healthy, balanced diet, get regular exercise, and maintain a healthy weight. Do not use any products that contain nicotine or tobacco, such as cigarettes, e-cigarettes, and chewing tobacco. Get help right away if you have any symptoms of a stroke. This information is not intended to  replace advice given to you by your health care provider. Make sure you discuss any questions you have with your healthcare provider. Document Revised: 11/12/2019 Document Reviewed: 11/12/2019 Elsevier Patient Education  2022 Elsevier Inc.  Cholesterol Content in Foods Cholesterol is a waxy, fat-like substance that helps to carry fat in the blood. The body needs cholesterol in small amounts, but too much cholesterol can causedamage to the arteries and heart. Most people should eat less than 200 milligrams (mg) of cholesterol a day. Foods with cholesterol  Cholesterol is found in animal-based foods, such as meat, seafood, and dairy. Generally, low-fat dairy and lean meats have less cholesterol than full-fat dairy and fatty meats. The milligrams of cholesterol per serving (mg per serving) of common cholesterol-containing foods are listed below. Meat and other proteins Egg -- one large whole egg has 186 mg. Veal shank -- 4 oz has 141 mg. Lean ground [place] (93% lean) -- 4 oz has 118 mg. Fat-trimmed lamb loin -- 4 oz has 106 mg. Lean ground beef (90% lean) -- 4 oz has 100 mg. Lobster -- 3.5 oz has 90 mg. Pork loin chops -- 4 oz has 86 mg. Canned salmon -- 3.5 oz has 83 mg. Fat-trimmed beef top loin -- 4 oz has 78 mg. Frankfurter -- 1 frank (3.5 oz) has 77 mg.  Crab -- 3.5 oz has 71 mg. Roasted chicken without skin, white meat -- 4 oz has 66 mg. Light bologna -- 2 oz has 45 mg. Deli-cut [place] -- 2 oz has 31 mg. Canned tuna -- 3.5 oz has 31 mg. Tomasa Blase -- 1 oz has 29 mg. Oysters and mussels (raw) -- 3.5 oz has 25 mg. Mackerel -- 1 oz has 22 mg. Trout -- 1 oz has 20 mg. Pork sausage -- 1 link (1 oz) has 17 mg. Salmon -- 1 oz has 16 mg. Tilapia -- 1 oz has 14 mg. Dairy Soft-serve ice cream --  cup (4 oz) has 103 mg. Whole-milk yogurt -- 1 cup (8 oz) has 29 mg. Cheddar cheese -- 1 oz has 28 mg. American cheese -- 1 oz has 28 mg. Whole milk -- 1 cup (8 oz) has 23 mg. 2% milk -- 1 cup (8  oz) has 18 mg. Cream cheese -- 1 tablespoon (Tbsp) has 15 mg. Cottage cheese --  cup (4 oz) has 14 mg. Low-fat (1%) milk -- 1 cup (8 oz) has 10 mg. Sour cream -- 1 Tbsp has 8.5 mg. Low-fat yogurt -- 1 cup (8 oz) has 8 mg. Nonfat Greek yogurt -- 1 cup (8 oz) has 7 mg. Half-and-half cream -- 1 Tbsp has 5 mg. Fats and oils Cod liver oil -- 1 tablespoon (Tbsp) has 82 mg. Butter -- 1 Tbsp has 15 mg. Lard -- 1 Tbsp has 14 mg. Bacon grease -- 1 Tbsp has 14 mg. Mayonnaise -- 1 Tbsp has 5-10 mg. Margarine -- 1 Tbsp has 3-10 mg. Exact amounts of cholesterol in these foods may vary depending on specificingredients and brands. Foods without cholesterol Most plant-based foods do not have cholesterol unless you combine them with a food that has cholesterol. Foods without cholesterol include: Grains and cereals. Vegetables. Fruits. Vegetable oils, such as olive, canola, and sunflower oil. Legumes, such as peas, beans, and lentils. Nuts and seeds. Egg whites. Summary The body needs cholesterol in small amounts, but too much cholesterol can cause damage to the arteries and heart. Most people should eat less than 200 milligrams (mg) of cholesterol a day. This information is not intended to replace advice given to you by your health care provider. Make sure you discuss any questions you have with your healthcare provider. Document Revised: 03/25/2020 Document Reviewed: 05/05/2020 Elsevier Patient Education  2022 [employer].

## 2021-07-31 NOTE — Progress Notes (Signed)
Chief Complaint  Patient presents with   Annual Exam    Pt is fasting and wants cholesterol labs done.    Medication Question    Pt wants a different needle sent in for his b12 injections.   Annual fasting   Stopped drinking etoh doing well  B12 needs syringe with needle  Anxiety and recurrent depression gad 7 7, phq 9 8 today on lexapro 20 mg qd declines to start wellbutrin tried age 35 y.o +SI w/o plan has fear of death Has therapy needs to reach out again will do Dm 2 controlled on metformin xr 287, trulicity 1.5 weekly, lipitor 40 mg qd  Htn elevated lis 20-12.5 2 pills qd elevated not had meds today  Review of Systems  Constitutional:  Negative for weight loss.  HENT:  Negative for hearing loss.   Eyes:  Negative for blurred vision.  Respiratory:  Negative for shortness of breath.   Cardiovascular:  Negative for chest pain.  Gastrointestinal:  Negative for abdominal pain.  Musculoskeletal:  Negative for falls and joint pain.  Skin:  Negative for rash.  Neurological:  Negative for headaches.  Psychiatric/Behavioral:  Positive for depression.        +SI w/o plan has fear of death  Past Medical History:  Diagnosis Date   Chicken pox    COVID-19    12/2020 sob mild, 05/21/21   Depression    Diabetes mellitus without complication ([place])    History of alcohol abuse    Hyperlipidemia    Hypertension    Past Surgical History:  Procedure Laterality Date   MANDIBLE FRACTURE SURGERY     Family History  Problem Relation Age of Onset   Alcohol abuse Mother    Hypertension Mother    Heart disease Mother        had heart attack 81    Diabetes Mother        type 2   Heart attack Mother 75   Alcohol abuse Father    Hypertension Father    Diabetes Father        type 1   Pancreatitis Father    Alcohol abuse Maternal Grandmother    Heart disease Maternal Grandmother        died in 7s    Hypertension Maternal Grandmother    Depression Maternal Grandmother    Stroke Paternal  Grandfather    Heart attack Maternal Uncle        had heart attack    Social History   Socioeconomic History   Marital status: Married    Spouse name: Not on file   Number of children: Not on file   Years of education: Not on file   Highest education level: Not on file  Occupational History   Not on file  Tobacco Use   Smoking status: Former   Smokeless tobacco: Former  Substance and Sexual Activity   Alcohol use: Yes    Alcohol/week: 6.0 standard drinks    Types: 6 Standard drinks or equivalent per week   Drug use: Yes    Types: Marijuana   Sexual activity: Not on file  Other Topics Concern   Not on file  Social History Narrative   Works IT    Married    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 At night   blood glucose meter kit and supplies KIT Dispense based on patient and insurance preference. Use up to four times daily as directed.   cyanocobalamin (,VITAMIN B-12,) 1000 MCG/ML injection Inject 1 mL (1,000 mcg total) into the muscle every 30 (thirty) days.   Dulaglutide (TRULICITY) 1.5 ZO/1.0RU SOPN INJECT 1.5 MG UNDER THE SKIN ONCE A WEEK   escitalopram (LEXAPRO) 20 MG tablet Take 1 tablet (20 mg total) by mouth daily.   ezetimibe (ZETIA) 10 MG tablet Take 1 tablet (10 mg total) by mouth daily.   lisinopril-hydrochlorothiazide (ZESTORETIC) 20-12.5 MG tablet Take 2 tablets by mouth daily. In am   LORazepam (ATIVAN) 0.5 MG tablet Take 1 tablet (0.5 mg total) by mouth daily as needed for anxiety.   metFORMIN (GLUCOPHAGE-XR) 500 MG 24 hr tablet Take 2 tablets (1,000 mg total) by mouth daily with breakfast.   ONE  TOUCH ULTRA TEST test strip    ONETOUCH DELICA LANCETS 04V MISC    Syringe/Needle, Disp, 25G X 1" 10 ML MISC 1 Device by Does not apply route every 30 (thirty) days.   [DISCONTINUED] NEEDLE, DISP, 25 G 25G X 1" MISC 1 Device by Does not apply route every 30 (thirty) days.   [DISCONTINUED] NEEDLE, DISP, 25 G 25G X 1-1/2" MISC 1 Device by Does not apply route every 30 (thirty) days.   Allergies  Allergen Reactions   Invokana [Canagliflozin] Rash   Penicillins Rash   No results found for this or any previous visit (from the past 2160 hour(s)). Objective  Body mass index is 31.53 kg/m. Wt Readings from Last 3 Encounters:  07/31/21 232 lb 8 oz (105.5 kg)  03/10/21 248 lb (112.5 kg)  12/09/20 247 lb 6.4 oz (112.2 kg)   Temp Readings from Last 3 Encounters:  07/31/21 98.7 F (37.1 C)  03/10/21 97.9 F (36.6 C) (Oral)  12/09/20 98.3 F (36.8 C) (Oral)   BP Readings from Last 3 Encounters:  07/31/21 (!) 140/98  03/10/21 122/88  12/09/20 (!) 140/94   Pulse Readings from Last 3 Encounters:  07/31/21 (!) 107  03/10/21 79  12/09/20 (!) 104    Physical Exam Vitals and nursing note reviewed.  Constitutional:      Appearance: Normal appearance. He is well-developed and well-groomed. He is obese.  HENT:     Head: Normocephalic and atraumatic.  Eyes:     Conjunctiva/sclera: Conjunctivae normal.     Pupils: Pupils are equal, round, and reactive to light.  Cardiovascular:     Rate and Rhythm: Normal rate and regular rhythm.     Heart sounds: Normal heart sounds. No murmur heard. Pulmonary:     Effort: Pulmonary effort is normal.     Breath sounds: Normal breath sounds.  Skin:    General: Skin is warm and dry.  Neurological:     General: No focal deficit present.     Mental Status: He is alert and oriented to person, place, and time. Mental status is at baseline.     Gait: Gait normal.  Psychiatric:        Attention and Perception: Attention and perception normal.        Mood  and Affect: Mood and affect normal.        Speech: Speech normal.  Behavior: Behavior normal. Behavior is cooperative.        Thought Content: Thought content normal.        Cognition and Memory: Cognition and memory normal.        Judgment: Judgment normal.    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  MMR immune  Had pna utd consider repeat 01/2021   D3 5000 IU daily and mvt rec rec smoking cessation and etoh cessation as of 07/30/21 stopped  Rec healthy diet and exercise  Vitamin B12 deficiency - Plan: Syringe/Needle, Disp, 25G X 1" 10 ML MISC, B12  Anxiety and depression Depression, recurrent ([place]) Reach out to male therapist  Lexapro 20 mg qd declines add wellbutrin for now consider further   Hypertension associated with diabetes ([place]) - Plan: Microalbumin / creatinine urine ratio, Hemoglobin A1c, Urinalysis, Routine w reflex microscopic, CBC w/Diff, Lipid panel, Comprehensive metabolic panel metformin xr 811, trulicity 1.5 weekly, lipitor 40 mg qd  Htn elevated lis 20-12.5 2 pills qd elevated not had meds todaymy chart BP after meds today Fasting labs today  Monitor BP at home  Sch Dr.Ritter eye exam   Vitamin D deficiency - Plan: Vitamin D (25 hydroxy)  Provider: Dr. Olivia Mackie McLean-Scocuzza-[facility] Medicine

## 2021-08-01 LAB — URINALYSIS, ROUTINE W REFLEX MICROSCOPIC
Bilirubin, UA: NEGATIVE
Leukocytes,UA: NEGATIVE
Nitrite, UA: NEGATIVE
Protein,UA: NEGATIVE
RBC, UA: NEGATIVE
Specific Gravity, UA: 1.017 (ref 1.005–1.030)
Urobilinogen, Ur: 0.2 mg/dL (ref 0.2–1.0)
pH, UA: 5 (ref 5.0–7.5)

## 2021-08-01 LAB — MICROALBUMIN / CREATININE URINE RATIO
Creatinine, Urine: 110.7 mg/dL
Microalb/Creat Ratio: 5 mg/g creat (ref 0–29)
Microalbumin, Urine: 6 ug/mL

## 2021-08-03 ENCOUNTER — Other Ambulatory Visit: Payer: Self-pay | Admitting: Internal Medicine

## 2021-08-03 DIAGNOSIS — I10 Essential (primary) hypertension: Secondary | ICD-10-CM

## 2021-08-03 MED ORDER — AMLODIPINE BESYLATE 2.5 MG PO TABS: 2.5000 mg | ORAL_TABLET | Freq: Every day | ORAL | 3 refills | Status: DC

## 2021-08-04 NOTE — Addendum Note (Signed)
Addended by: Quentin Ore on: 08/04/2021 12:34 PM   Modules accepted: Orders

## 2021-08-05 ENCOUNTER — Telehealth: Payer: Self-pay

## 2021-08-05 NOTE — Chronic Care Management (AMB) (Signed)
Care Management   Outreach Note  08/05/2021 Name: Jorge Wright MRN: [id number] DOB: 1986-06-17  Referred by: McLean-Scocuzza, Pasty Spillers, MD Reason for referral : Care Coordination (Outreach to schedule referral with Pharm D )   An unsuccessful telephone outreach was attempted today. The patient was referred to the case management team for assistance with care management and care coordination.   Follow Up Plan: A HIPAA compliant phone message was left for the patient providing contact information and requesting a return call.  The care management team will reach out to the patient again over the next 7 days.  If patient returns call to provider office, please advise to call Embedded Care Management Care Guide Penne Lash at [phone]  Penne Lash, RMA Care Guide, Embedded Care Coordination [facility]  [place], [place] [address] Direct Dial: [phone] [email or web address] Website: [email or web address]

## 2021-08-05 NOTE — Progress Notes (Deleted)
Did

## 2021-08-11 NOTE — Chronic Care Management (AMB) (Signed)
Care Management   Outreach Note  08/11/2021 Name: Jorge Wright MRN: [id number] DOB: 09-Feb-1986  Referred by: McLean-Scocuzza, Pasty Spillers, MD Reason for referral : Care Coordination (Outreach to schedule referral with Pharm D )   A second unsuccessful telephone outreach was attempted today. The patient was referred to the case management team for assistance with care management and care coordination.   Follow Up Plan:  A HIPAA compliant phone message was left for the patient providing contact information and requesting a return call.  The care management team will reach out to the patient again over the next 7 days.  If patient returns call to provider office, please advise to call Embedded Care Management Care Guide Penne Lash  at [phone]  Penne Lash, RMA Care Guide, Embedded Care Coordination [facility]  [place], [place] [address] Direct Dial: [phone] [email or web address]

## 2021-08-19 NOTE — Chronic Care Management (AMB) (Signed)
Care Management   Note  08/19/2021 Name: Jorge Wright MRN: [id number] DOB: [birth year]  Jorge Wright is a 34 y.o. year old male who is a primary care patient of McLean-Scocuzza, Pasty Spillers, MD. I reached out to Jorge Wright by phone today in response to a referral sent by [name]'s health plan.    [name] was given information about care management services today including:  Care management services include personalized support from designated clinical staff supervised by his physician, including individualized plan of care and coordination with other care providers 24/7 contact phone numbers for assistance for urgent and routine care needs. The patient may stop care management services at any time by phone call to the office staff.  Patient agreed to services and verbal consent obtained.   Follow up plan: Telephone appointment with care management team member scheduled for:08/26/2021  Penne Lash, RMA Care Guide, Embedded Care Coordination [facility]  [place], [place] [address] Direct Dial: [phone] [email or web address]: [email or web address]

## 2021-08-26 ENCOUNTER — Ambulatory Visit: Payer: 59 | Admitting: Pharmacist

## 2021-08-26 ENCOUNTER — Telehealth: Payer: Self-pay | Admitting: Pharmacist

## 2021-08-26 DIAGNOSIS — E782 Mixed hyperlipidemia: Secondary | ICD-10-CM

## 2021-08-26 DIAGNOSIS — E119 Type 2 diabetes mellitus without complications: Secondary | ICD-10-CM

## 2021-08-26 DIAGNOSIS — I10 Essential (primary) hypertension: Secondary | ICD-10-CM

## 2021-08-26 MED ORDER — TIRZEPATIDE 5 MG/0.5ML ~~LOC~~ SOAJ: 5.0000 mg | SUBCUTANEOUS | 1 refills | Status: DC

## 2021-08-26 MED ORDER — TIRZEPATIDE 2.5 MG/0.5ML ~~LOC~~ SOAJ: 2.5000 mg | SUBCUTANEOUS | 0 refills | Status: AC

## 2021-08-26 NOTE — Chronic Care Management (AMB) (Signed)
Care Management   Pharmacy Note  08/26/2021 Name: Jorge Wright MRN: [id number] DOB: [birth year]  Subjective: Jorge Wright is a 35 y.o. year old male who is a primary care patient of McLean-Scocuzza, Nino Glow, MD. Collaborating with covering provider Tommi Rumps, MD while provider is on leave. The Care Management team was consulted for assistance with care management and care coordination needs.    Engaged with patient by telephone for initial visit in response to provider referral for pharmacy case management and/or care coordination services.   The patient was given information about Care Management services today including:  Care Management services includes personalized support from designated clinical staff supervised by the patient's primary care provider, including individualized plan of care and coordination with other care providers. 24/7 contact phone numbers for assistance for urgent and routine care needs. The patient may stop case management services at any time by phone call to the office staff.  Patient agreed to services and consent obtained.  Assessment:  Review of patient status, including review of consultants reports, laboratory and other test data, was performed as part of comprehensive evaluation and provision of chronic care management services.   SDOH (Social Determinants of Health) assessments and interventions performed:  SDOH Interventions    Flowsheet Row Most Recent Value  SDOH Interventions   Financial Strain Interventions Intervention Not Indicated        Objective:  Lab Results  Component Value Date   CREATININE 1.25 07/31/2021   CREATININE 1.01 02/09/2021   CREATININE 1.05 07/08/2020    Lab Results  Component Value Date   HGBA1C 8.7 (H) 07/31/2021       Component Value Date/Time   CHOL 156 07/31/2021 0947   TRIG 215.0 (H) 07/31/2021 0947   HDL 38.00 (L) 07/31/2021 0947   CHOLHDL 4 07/31/2021 0947   VLDL 43.0 (H) 07/31/2021  0947   LDLCALC 79 07/03/2019 1121   LDLDIRECT 93.0 07/31/2021 0947    Clinical ASCVD: No  The ASCVD Risk score (Goff DC Jr., et al., 2013) failed to calculate for the following reasons:   The 2013 ASCVD risk score is only valid for ages 42 to 76     BP Readings from Last 3 Encounters:  07/31/21 (!) 140/98  03/10/21 122/88  12/09/20 (!) 140/94    Care Plan  Allergies  Allergen Reactions   Invokana [Canagliflozin] Rash    Reports topical rash on trunk that occurred after taking Invokana for about a week. Resolved after medication discontinuation. No airway involvement.    Penicillins Rash    Medications Reviewed Today     Reviewed by De Hollingshead, RPH-CPP (Pharmacist) on 08/26/21 at [facility] List Status: <None>   Medication Order Taking? Sig Documenting Provider Last Dose Status Informant  amLODipine (NORVASC) 2.5 MG tablet [id number] Yes Take 1 tablet (2.5 mg total) by mouth daily. McLean-Scocuzza, Nino Glow, MD Taking Active   atorvastatin (LIPITOR) 40 MG tablet [id number] Yes Take 1 tablet (40 mg total) by mouth daily. At night McLean-Scocuzza, Nino Glow, MD Taking Active   blood glucose meter kit and supplies KIT [id number] Yes Dispense based on patient and insurance preference. Use up to four times daily as directed. Coral Spikes, DO Taking Active   cyanocobalamin (,VITAMIN B-12,) 1000 MCG/ML injection [id number] Yes Inject 1 mL (1,000 mcg total) into the muscle every 30 (thirty) days. McLean-Scocuzza, Nino Glow, MD Taking Active   Dulaglutide (TRULICITY) 1.5 KT/6.2BW SOPN [id number] Yes INJECT 1.5 MG UNDER THE SKIN  ONCE A WEEK McLean-Scocuzza, Nino Glow, MD Taking Active   escitalopram (LEXAPRO) 20 MG tablet [id number] Yes Take 1 tablet (20 mg total) by mouth daily. McLean-Scocuzza, Nino Glow, MD Taking Active   ezetimibe (ZETIA) 10 MG tablet [id number] Yes Take 1 tablet (10 mg total) by mouth daily. McLean-Scocuzza, Nino Glow, MD Taking Active   lisinopril-hydrochlorothiazide  (ZESTORETIC) 20-12.5 MG tablet [id number] Yes Take 2 tablets by mouth daily. In am McLean-Scocuzza, Nino Glow, MD Taking Active   LORazepam (ATIVAN) 0.5 MG tablet [id number] Yes Take 1 tablet (0.5 mg total) by mouth daily as needed for anxiety. McLean-Scocuzza, Nino Glow, MD Taking Active            Med Note Darnelle Maffucci, Arville Lime   Wed Aug 26, 2021  1:03 PM) 1-2 times weekly  metFORMIN (GLUCOPHAGE-XR) 500 MG 24 hr tablet [id number] Yes Take 2 tablets (1,000 mg total) by mouth daily with breakfast. McLean-Scocuzza, Nino Glow, MD Taking Active   ONE TOUCH ULTRA TEST test strip [id number] Yes  [provider] Taking Active   [facility] LANCETS 02D MISC [id number] Yes  [provider] Taking Active   Syringe/Needle, Disp, 25G X 1" 10 ML MISC [id number] Yes 1 Device by Does not apply route every 30 (thirty) days. McLean-Scocuzza, Nino Glow, MD Taking Active             Patient Active Problem List   Diagnosis Date Noted   Vitamin B12 deficiency 07/13/2020   Elevated liver enzymes 07/08/2020   Hypertension associated with diabetes ([place]) 06/03/2020   Depression, recurrent ([place]) 06/03/2020   Vitamin D deficiency 08/29/2018   Hepatic steatosis 05/31/2017   Annual physical exam 04/22/2017   DM type 2 (diabetes mellitus, type 2) ([place]) 06/14/2016   HTN (hypertension) 06/14/2016   HLD (hyperlipidemia) 06/14/2016   Anxiety and depression 06/14/2016   Alcoholism in recovery ([place]) 05/01/2014    Conditions to be addressed/monitored: HTN, HLD, and DMII  Care Plan : Medication Management  Updates made by De Hollingshead, RPH-CPP since 08/26/2021 12:00 AM     Problem: Diabetes, HTN, HLD, Fatty Liver Disease      Long-Range Goal: Disease Progression Prevention   Start Date: 08/26/2021  This Visit's Progress: On track  Priority: High  Note:   Current Barriers:  Unable to achieve control of diabetes   Pharmacist Clinical Goal(s):  Over the next 90 days, patient will achieve control  of diabetes as evidenced by A1c  through collaboration with PharmD and provider.   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 Rash resolved with stopping the medication. Does not remember any throat involvement, however, likely avoid SGLT2 moving forward unless significant compelling comorbidity develops Current glucose readings: not checking at home Most recent weight: 230 lbs Current meal patterns: breakfast:  sausage, egg, and cheese biscuit ; lunch: spaghetti today; sandwiches, baked chicken and brown rice; dinner: [place] on the weekends; leftover take out; snacks: pretzels, chips; drinks: diet cheerwine, coffee, not a lot of water, but some flavored  Current exercise: 30 minutes at least 3 times weekly Educated on goal A1c, goal fasting, and goal 2 hour post prandial glucose readings. Encouraged to monitor glucose periodically throughout the week.  Educated on anticipated improved glycemic and weight loss benefit of Mounjaro vs Trulicity. Patient amenable to change. Counseled on risk of increased GI upset. Advised to contact our office with more severe  symptoms, including nausea, diarrhea, stomach pain. Patient verbalized understanding. Stop Trulicity. Start Mounjaro 2.5 mg weekly for 4 weeks, then increase to 5 mg weekly. Sample script for Mounjaro 2.5 mg provided. Will send script for 5 mg dose to mail order pharmacy. Advised on availability of savings card at local pharmacy if his insurance does not cover Mounjaro at this time.  Advised on dietary interventions, including focus on lean proteins, vegetables, healthy fats, whole grains, fibers, water intake.  Praised for incorporation of regular activity. Agree with goal to work up from 30 minutes 3 days weekly moving forward  Hypertension: Unknown as patient is not checking at home; current treatment: amlodipine 2.5 mg daily, lisinopril/HCTZ 40/25 mg QAM;  Current home readings: not checking at home recently Encouraged to periodically check home BP readings s/p initiation of amlodipine to evaluate BP control.   Hyperlipidemia: Uncontrolled; current treatment: atorvastatin 40 mg daily, ezetimibe 10 mg daily ;  Medications previously tried:  Current dietary patterns: hx keto diet, but resulted in high TG; infrequent red meat consumption recently Recommend goal LDL <70 given DM + HTN. Consider dose increase of atorvastatin moving forward.   Depression/Anxiety: Controlled per patient report; current regimen: escitalopram 10 mg daily, lorazepam 0.5 mg PRN anxiety - 1-2 times weekly  Recommended to continue current regimen at this time  Patient Goals/Self-Care Activities Over the next 90 days, patient will:  - take medications as prescribed check glucose periodically, document, and provide at future appointments check blood pressure periodically, document, and provide at future appointments target a minimum of 150 minutes of moderate intensity exercise weekly engage in dietary modifications by reducing carbohydrate portion sizes  Follow Up Plan: Telephone follow up appointment with care  management team member scheduled for: ~ 6 weeks      Medication Assistance:  None required.  Patient affirms current coverage meets needs.  Follow Up:  Patient agrees to Care Plan and Follow-up.  Plan: Telephone follow up appointment with care management team member scheduled for:  ~6 weeks  Catie Darnelle Maffucci, PharmD, [place], [place] Clinical Pharmacist [employer] at [employer] [phone]

## 2021-08-26 NOTE — Telephone Encounter (Signed)
Medication Samples have been labeled and logged for the patient.  Drug name: Mounaro       Strength: 2.5 mg        Qty: 1 box  LOT: [id number]  Exp.Date: 09/29/2032  Dosing instructions: Inject 2.5 mg weekly for 4 weeks, then increase to 5 mg weekly  The patient has been instructed regarding the correct time, dose, and frequency of taking this medication, including desired effects and most common side effects.   Lourena Simmonds 1:50 PM 08/26/2021

## 2021-08-26 NOTE — Patient Instructions (Signed)
Visit Information  PATIENT GOALS:   Goals Addressed               This Visit's Progress     Patient Stated     Medication Monitoring (pt-stated)        Patient Goals/Self-Care Activities Over the next 90 days, patient will:  - take medications as prescribed check glucose periodically, document, and provide at future appointments check blood pressure periodically, document, and provide at future appointments target a minimum of 150 minutes of moderate intensity exercise weekly engage in dietary modifications by reducing carbohydrate portion sizes        Jorge Wright was given information about Care Management services by the embedded care coordination team including:  Care Management services include personalized support from designated clinical staff supervised by his physician, including individualized plan of care and coordination with other care providers 24/7 contact phone numbers for assistance for urgent and routine care needs. The patient may stop CCM services at any time (effective at the end of the month) by phone call to the office staff.  Patient agreed to services and verbal consent obtained.   Patient verbalizes understanding of instructions provided today and agrees to view in MyChart.   Plan: Telephone follow up appointment with care management team member scheduled for:  ~6 weeks  Catie Feliz Beam, PharmD, [place], CPP Clinical Pharmacist [employer] at [employer] [phone]

## 2021-08-27 NOTE — Telephone Encounter (Signed)
Patient picked up medication

## 2021-09-09 ENCOUNTER — Telehealth: Payer: Self-pay

## 2021-09-09 ENCOUNTER — Other Ambulatory Visit: Payer: Self-pay

## 2021-09-09 NOTE — Telephone Encounter (Signed)
A PA was approved for [facility] pen from 08/04/2021-09/03/2022. Lavontay Kirk,cma

## 2021-09-29 ENCOUNTER — Telehealth: Payer: Self-pay

## 2021-09-29 NOTE — Chronic Care Management (AMB) (Signed)
Care Management   Note  09/29/2021 Name: Jorge Wright MRN: [id number] DOB: 07/22/1986  Jorge Wright is a 35 y.o. year old male who is a primary care patient of McLean-Scocuzza, Pasty Spillers, MD and is actively engaged with the care management team. I reached out to Jorge Wright by phone today to assist with re-scheduling a follow up visit with the Pharmacist  Follow up plan: Unsuccessful telephone outreach attempt made.  The care management team will reach out to the patient again over the next 3 days.  If patient returns call to provider office, please advise to call Embedded Care Management Care Guide Penne Lash  at [phone]  Penne Lash, RMA Care Guide, Embedded Care Coordination [facility]  [place], [place] [address] Direct Dial: [phone] [email or web address] Website: [email or web address]

## 2021-10-07 ENCOUNTER — Telehealth: Payer: 59

## 2021-10-08 NOTE — Chronic Care Management (AMB) (Signed)
Care Management   Note  10/08/2021 Name: [name] MRN: [id number] DOB: January 14, 1986  Jorge Wright is a 35 y.o. year old male who is a primary care patient of McLean-Scocuzza, Pasty Spillers, MD and is actively engaged with the care management team. I reached out to Jorge Wright by phone today to assist with re-scheduling a follow up visit with the Pharmacist  Follow up plan: Telephone appointment with care management team member scheduled for:10/26/2021  Penne Lash, RMA Care Guide, Embedded Care Coordination [facility]  [place], [place] [address] Direct Dial: [phone] [email or web address]: [email or web address]

## 2021-10-26 ENCOUNTER — Ambulatory Visit: Payer: 59 | Admitting: Pharmacist

## 2021-10-26 DIAGNOSIS — I10 Essential (primary) hypertension: Secondary | ICD-10-CM

## 2021-10-26 DIAGNOSIS — E782 Mixed hyperlipidemia: Secondary | ICD-10-CM

## 2021-10-26 DIAGNOSIS — E119 Type 2 diabetes mellitus without complications: Secondary | ICD-10-CM

## 2021-10-26 NOTE — Patient Instructions (Signed)
Visit Information   Goals Addressed               This Visit's Progress     Patient Stated     Medication Monitoring (pt-stated)        Patient Goals/Self-Care Activities Over the next 90 days, patient will:  - take medications as prescribed check glucose periodically, document, and provide at future appointments check blood pressure periodically, document, and provide at future appointments target a minimum of 150 minutes of moderate intensity exercise weekly engage in dietary modifications by reducing carbohydrate portion sizes         Patient verbalizes understanding of instructions provided today and agrees to view in MyChart.    Plan: Telephone follow up appointment with care management team member scheduled for:  8 weeks  Catie Feliz Beam, PharmD, [place], CPP Clinical Pharmacist [employer] at [employer] [phone]

## 2021-10-26 NOTE — Chronic Care Management (AMB) (Signed)
Chronic Care Management CCM Pharmacy Note  10/26/2021 Name:  Jorge Wright MRN:  [id number] DOB:  May 06, 1986  Subjective: Jorge Wright is an 35 y.o. year old male who is a primary patient of McLean-Scocuzza, Nino Glow, MD.  The CCM team was consulted for assistance with disease management and care coordination needs.    Engaged with patient by telephone for follow up visit for pharmacy case management and/or care coordination services.   Objective:  Medications Reviewed Today     Reviewed by De Hollingshead, RPH-CPP (Pharmacist) on 10/26/21 at 1101  Med List Status: <None>   Medication Order Taking? Sig Documenting Provider Last Dose Status Informant  amLODipine (NORVASC) 2.5 MG tablet [id number]  Take 1 tablet (2.5 mg total) by mouth daily. McLean-Scocuzza, Nino Glow, MD  Active   atorvastatin (LIPITOR) 40 MG tablet [id number]  Take 1 tablet (40 mg total) by mouth daily. At night McLean-Scocuzza, Nino Glow, MD  Active   B-D 3CC LUER-LOK SYR 25GX1" 25G X 1" 3 ML MISC [id number]   [provider]  Active   blood glucose meter kit and supplies KIT [id number]  Dispense based on patient and insurance preference. Use up to four times daily as directed. Coral Spikes, DO  Active   Cholecalciferol (VITAMIN D) 50 MCG (2000 UT) CAPS [id number]  Take 1,000 Units by mouth daily. [provider]  Active   cyanocobalamin (,VITAMIN B-12,) 1000 MCG/ML injection [id number]  Inject 1 mL (1,000 mcg total) into the muscle every 30 (thirty) days. McLean-Scocuzza, Nino Glow, MD  Active   escitalopram (LEXAPRO) 20 MG tablet [id number]  Take 1 tablet (20 mg total) by mouth daily. McLean-Scocuzza, Nino Glow, MD  Active   ezetimibe (ZETIA) 10 MG tablet [id number]  Take 1 tablet (10 mg total) by mouth daily. McLean-Scocuzza, Nino Glow, MD  Active   lisinopril-hydrochlorothiazide (ZESTORETIC) 20-12.5 MG tablet [id number]  Take 2 tablets by mouth daily. In am McLean-Scocuzza, Nino Glow, MD  Active    LORazepam (ATIVAN) 0.5 MG tablet [id number]  Take 1 tablet (0.5 mg total) by mouth daily as needed for anxiety. McLean-Scocuzza, Nino Glow, MD  Active            Med Note Darnelle Maffucci, Arville Lime   Wed Aug 26, 2021  1:03 PM) 1-2 times weekly  metFORMIN (GLUCOPHAGE-XR) 500 MG 24 hr tablet [id number] Yes Take 2 tablets (1,000 mg total) by mouth daily with breakfast. McLean-Scocuzza, Nino Glow, MD Taking Active   ONE TOUCH ULTRA TEST test strip [id number]   [provider]  Active   [facility] LANCETS 49F [place] [id number]   [provider]  Active   Syringe/Needle, Disp, 25G X 1" 10 ML MISC [id number]  1 Device by Does not apply route every 30 (thirty) days. McLean-Scocuzza, Nino Glow, MD  Active   tirzepatide [facility]) 5 MG/0.5ML Pen [id number] Yes Inject 5 mg into the skin once a week. Leone Haven, MD Taking Active             Pertinent Labs:   Lab Results  Component Value Date   HGBA1C 8.7 (H) 07/31/2021   Lab Results  Component Value Date   CHOL 156 07/31/2021   HDL 38.00 (L) 07/31/2021   LDLCALC 79 07/03/2019   LDLDIRECT 93.0 07/31/2021   TRIG 215.0 (H) 07/31/2021   CHOLHDL 4 07/31/2021   Lab Results  Component Value Date   CREATININE 1.25 07/31/2021   BUN 17 07/31/2021   NA 134 (L)  07/31/2021   K 4.2 07/31/2021   CL 101 07/31/2021   CO2 23 07/31/2021     SDOH:  (Social Determinants of Health) assessments and interventions performed: Yes SDOH Interventions    Flowsheet Row Most Recent Value  SDOH Interventions   Financial Strain Interventions Intervention Not Indicated       CCM Care Plan  Review of patient past medical history, allergies, medications, health status, including review of consultants reports, laboratory and other test data, was performed as part of comprehensive evaluation and provision of chronic care management services.   Conditions to be addressed/monitored:  Hypertension, Hyperlipidemia, and Diabetes  Care Plan :  Medication Management  Updates made by De Hollingshead, RPH-CPP since 10/26/2021 12:00 AM     Problem: Diabetes, HTN, HLD, Fatty Liver Disease      Long-Range Goal: Disease Progression Prevention   Start Date: 08/26/2021  This Visit's Progress: On track  Recent Progress: On track  Priority: High  Note:   Current Barriers:  Unable to achieve control of diabetes   Pharmacist Clinical Goal(s):  Over the next 90 days, patient will achieve control of diabetes as evidenced by A1c  through collaboration with PharmD and provider.   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 Rash resolved with stopping the medication. Does not remember any throat involvement, however, likely avoid SGLT2 moving forward unless significant compelling comorbidity develops Current glucose readings: random readings 180-200s Most recent weight: 230 lbs Current meal patterns: does not notice decrease on appetite, but baseline was eating smaller meals  Current exercise: 30 minutes at least 3 times  weekly Educated on goal A1c, goal fasting, and goal 2 hour post prandial glucose readings. Encouraged to monitor glucose periodically throughout the week.  Recommend to increase to 5 mg as planned. Asked patient to send myChart in 4 weeks if he would like to increase to 7.5 mg. Otherwise, continue 5 mg weekly until our follow pu in 8 weeks  Hypertension: Uncontrolled at last office visit; current treatment: amlodipine 2.5 mg daily, lisinopril/HCTZ 40/25 mg QAM;  Previously encouraged to periodically check home BP readings s/p initiation of amlodipine to evaluate BP control.   Hyperlipidemia: Uncontrolled; current treatment: atorvastatin 40 mg daily, ezetimibe 10 mg daily ;  Medications previously tried:  Current dietary patterns: hx keto diet, but resulted in high TG; infrequent red meat consumption recently Previously recommend goal LDL <70 given DM + HTN. Consider dose increase of atorvastatin moving forward.   Depression/Anxiety: Controlled per patient report; current regimen: escitalopram 10 mg daily, lorazepam 0.5 mg PRN anxiety - 1-2 times weekly  Previously recommended to continue current regimen at this time  Patient Goals/Self-Care Activities Over the next 90  days, patient will:  - take medications as prescribed check glucose periodically, document, and provide at future appointments check blood pressure periodically, document, and provide at future appointments target a minimum of 150 minutes of moderate intensity exercise weekly engage in dietary modifications by reducing carbohydrate portion sizes  Follow Up Plan: Telephone follow up appointment with care management team member scheduled for: ~ 8 weeks       Plan: Telephone follow up appointment with care management team member scheduled for:  8 weeks  Catie Darnelle Maffucci, PharmD, [place], CPP Clinical Pharmacist [employer] at [employer] [phone]

## 2021-11-11 ENCOUNTER — Other Ambulatory Visit: Payer: Self-pay | Admitting: Internal Medicine

## 2021-11-11 DIAGNOSIS — E538 Deficiency of other specified B group vitamins: Secondary | ICD-10-CM

## 2021-11-18 ENCOUNTER — Other Ambulatory Visit: Payer: Self-pay

## 2021-11-18 DIAGNOSIS — E538 Deficiency of other specified B group vitamins: Secondary | ICD-10-CM

## 2021-11-18 MED ORDER — CYANOCOBALAMIN 1000 MCG/ML IJ SOLN: 1000.0000 ug | INTRAMUSCULAR | 3 refills | Status: DC

## 2021-12-11 ENCOUNTER — Encounter: Payer: Self-pay | Admitting: Internal Medicine

## 2021-12-14 ENCOUNTER — Telehealth: Payer: Self-pay | Admitting: Pharmacist

## 2021-12-14 DIAGNOSIS — E119 Type 2 diabetes mellitus without complications: Secondary | ICD-10-CM

## 2021-12-14 MED ORDER — TIRZEPATIDE 7.5 MG/0.5ML ~~LOC~~ SOAJ: 7.5000 mg | SUBCUTANEOUS | 1 refills | Status: DC

## 2021-12-14 NOTE — Telephone Encounter (Signed)
Okay to send this in for the Patient?

## 2021-12-14 NOTE — Telephone Encounter (Signed)
See MyChart. Script for Mounjaro 7.5 mg sent to [facility]

## 2021-12-22 ENCOUNTER — Ambulatory Visit: Payer: 59 | Admitting: Pharmacist

## 2021-12-22 DIAGNOSIS — E1159 Type 2 diabetes mellitus with other circulatory complications: Secondary | ICD-10-CM

## 2021-12-22 DIAGNOSIS — E119 Type 2 diabetes mellitus without complications: Secondary | ICD-10-CM

## 2021-12-22 MED ORDER — FREESTYLE LIBRE 3 SENSOR MISC: 1.0000 | 11 refills | Status: DC

## 2021-12-22 MED ORDER — OZEMPIC (0.25 OR 0.5 MG/DOSE) 2 MG/1.5ML ~~LOC~~ SOPN: 0.5000 mg | PEN_INJECTOR | SUBCUTANEOUS | 2 refills | Status: DC

## 2021-12-22 MED ORDER — METFORMIN HCL ER 500 MG PO TB24: 1000.0000 mg | ORAL_TABLET | Freq: Two times a day (BID) | ORAL | 3 refills | Status: DC

## 2021-12-22 NOTE — Chronic Care Management (AMB) (Signed)
Chronic Care Management CCM Pharmacy Note  12/22/2021 Name:  Jorge Wright MRN:  [id number] DOB:  [birth year]  Summary: - Glucose elevated. Denies benefit from [facility] and reports intolerability. - Wonders about Libre CGM  Recommendations/Changes made from today's visit: - Stop Mounjaro. Start Ozempic 0.25 mg weekly for 2 weeks, then increase to 0.5 mg weekly.  - Increase metformin to 1500 mg daily for at least 1 week, then increase to 2000 mg weekly as tolerated.  - Script sent for [employer] 3  Subjective: Jorge Wright is an 35 y.o. year old male who is a primary patient of McLean-Scocuzza, Nino Glow, MD.  The CCM team was consulted for assistance with disease management and care coordination needs.    Engaged with patient by telephone for follow up visit for pharmacy case management and/or care coordination services.   Objective:  Medications Reviewed Today     Reviewed by De Hollingshead, RPH-CPP (Pharmacist) on 10/26/21 at 1101  Med List Status: <None>   Medication Order Taking? Sig Documenting Provider Last Dose Status Informant  amLODipine (NORVASC) 2.5 MG tablet [id number]  Take 1 tablet (2.5 mg total) by mouth daily. McLean-Scocuzza, Nino Glow, MD  Active   atorvastatin (LIPITOR) 40 MG tablet [id number]  Take 1 tablet (40 mg total) by mouth daily. At night McLean-Scocuzza, Nino Glow, MD  Active   B-D 3CC LUER-LOK SYR 25GX1" 25G X 1" 3 ML MISC [id number]   [provider]  Active   blood glucose meter kit and supplies KIT [id number]  Dispense based on patient and insurance preference. Use up to four times daily as directed. Coral Spikes, DO  Active   Cholecalciferol (VITAMIN D) 50 MCG (2000 UT) CAPS [id number]  Take 1,000 Units by mouth daily. [provider]  Active   cyanocobalamin (,VITAMIN B-12,) 1000 MCG/ML injection [id number]  Inject 1 mL (1,000 mcg total) into the muscle every 30 (thirty) days. McLean-Scocuzza, Nino Glow, MD  Active   escitalopram  (LEXAPRO) 20 MG tablet [id number]  Take 1 tablet (20 mg total) by mouth daily. McLean-Scocuzza, Nino Glow, MD  Active   ezetimibe (ZETIA) 10 MG tablet [id number]  Take 1 tablet (10 mg total) by mouth daily. McLean-Scocuzza, Nino Glow, MD  Active   lisinopril-hydrochlorothiazide (ZESTORETIC) 20-12.5 MG tablet [id number]  Take 2 tablets by mouth daily. In am McLean-Scocuzza, Nino Glow, MD  Active   LORazepam (ATIVAN) 0.5 MG tablet [id number]  Take 1 tablet (0.5 mg total) by mouth daily as needed for anxiety. McLean-Scocuzza, Nino Glow, MD  Active            Med Note Darnelle Maffucci, Arville Lime   Wed Aug 26, 2021  1:03 PM) 1-2 times weekly  metFORMIN (GLUCOPHAGE-XR) 500 MG 24 hr tablet [id number] Yes Take 2 tablets (1,000 mg total) by mouth daily with breakfast. McLean-Scocuzza, Nino Glow, MD Taking Active   ONE TOUCH ULTRA TEST test strip [id number]   [provider]  Active   [facility] LANCETS 12I [place] [id number]   [provider]  Active   Syringe/Needle, Disp, 25G X 1" 10 ML MISC [id number]  1 Device by Does not apply route every 30 (thirty) days. McLean-Scocuzza, Nino Glow, MD  Active   tirzepatide [facility]) 5 MG/0.5ML Pen [id number] Yes Inject 5 mg into the skin once a week. Leone Haven, MD Taking Active             Pertinent Labs:   Lab Results  Component Value Date  HGBA1C 8.7 (H) 07/31/2021   Lab Results  Component Value Date   CHOL 156 07/31/2021   HDL 38.00 (L) 07/31/2021   LDLCALC 79 07/03/2019   LDLDIRECT 93.0 07/31/2021   TRIG 215.0 (H) 07/31/2021   CHOLHDL 4 07/31/2021   Lab Results  Component Value Date   CREATININE 1.25 07/31/2021   BUN 17 07/31/2021   NA 134 (L) 07/31/2021   K 4.2 07/31/2021   CL 101 07/31/2021   CO2 23 07/31/2021    SDOH:  (Social Determinants of Health) assessments and interventions performed:  SDOH Interventions    Flowsheet Row Most Recent Value  SDOH Interventions   Financial Strain Interventions Intervention Not Indicated        CCM Care Plan  Review of patient past medical history, allergies, medications, health status, including review of consultants reports, laboratory and other test data, was performed as part of comprehensive evaluation and provision of chronic care management services.   Care Plan : Medication Management  Updates made by De Hollingshead, RPH-CPP since 12/22/2021 12:00 AM     Problem: Diabetes, HTN, HLD, Fatty Liver Disease      Long-Range Goal: Disease Progression Prevention   Start Date: 08/26/2021  Recent Progress: On track  Priority: High  Note:   Current Barriers:  Unable to achieve control of diabetes   Pharmacist Clinical Goal(s):  Over the next 90 days, patient will achieve control of diabetes as evidenced by A1c  through collaboration with PharmD and provider.   Interventions: 1:1 collaboration with McLean-Scocuzza, Nino Glow, MD regarding development and update of comprehensive plan of care as evidenced by provider attestation and co-signature Inter-disciplinary care team collaboration (see longitudinal plan of care) Comprehensive medication review performed; medication list updated in electronic medical record  Health Maintenance Yearly diabetic eye exam: due - will discuss moving forward Yearly diabetic foot exam: due - will discuss moving forward Urine microalbumin: up to date Yearly influenza vaccination: due Td/Tdap vaccination: up to date Pneumonia vaccination: due COVID vaccinations: up to date  Diabetes: Uncontrolled; current treatment: metformin XR 1000 mg QAM, Mounjaro 7.5 mg weekly- reports he did not pick up this dose, has been off for ~2 weeks Reports he doesn't feel a lot of benefit from this medication. Injection site irritation for almost a week. Also reports that his depression has worsened, and since he has been off the medication, he has felt better.  Denies GI upset, nausea Hx Invokana - rash on trunk started about a week in, he continued to  take for about a month, then stopped. Rash resolved with stopping the medication. Does not remember any throat involvement, however, likely avoid SGLT2 moving forward unless significant compelling comorbidity develops Mounjaro - reports injection site irritation, worsening mood Current glucose readings: random readings 180-200s Most recent weight: 230 lbs Current exercise: 30 minutes at least 3 times weekly Discussed Libre 3. Script sent. Patient confident in ability to set up on his own He requests to try a different agent, agree with his reported intolerance. Stop Mounjaro. Start Ozempic 0.25 mg weekly for 2 weeks, then increase to Ozempic 0.5 mg weekly. Discussed faster titration due to GI tolerability at baseline In the meantime, increase metformin XR to a total of 1500 mg daily for at least 1 week; if tolerated, can increase to 2000 mg daily.   Hypertension: Uncontrolled at last office visit; current treatment: amlodipine 2.5 mg daily, lisinopril/HCTZ 40/25 mg QAM;  Previously encouraged to periodically check home BP readings s/p initiation of  amlodipine to evaluate BP control.   Hyperlipidemia: Uncontrolled; current treatment: atorvastatin 40 mg daily, ezetimibe 10 mg daily ;  Medications previously tried:  Current dietary patterns: hx keto diet, but resulted in high TG; infrequent red meat consumption recently Previously recommend goal LDL <70 given DM + HTN. Consider dose increase of atorvastatin moving forward.   Depression/Anxiety: Controlled per patient report; current regimen: escitalopram 10 mg daily, lorazepam 0.5 mg PRN anxiety - 1-2 times weekly  Previously recommended to continue current regimen at this time  Patient Goals/Self-Care Activities Over the next 90 days, patient will:  - take medications as prescribed check glucose periodically, document, and provide at future appointments check blood pressure periodically, document, and provide at future appointments target a  minimum of 150 minutes of moderate intensity exercise weekly engage in dietary modifications by reducing carbohydrate portion sizes       Plan: Video visit scheduled for 5 weeks  Catie Darnelle Maffucci, PharmD, [place], CPP Clinical Pharmacist [employer] at [employer] [phone]

## 2021-12-22 NOTE — Patient Instructions (Signed)
Visit Information  Following are the goals we discussed today:  Patient Goals/Self-Care Activities Over the next 90 days, patient will:  - take medications as prescribed check glucose periodically, document, and provide at future appointments check blood pressure periodically, document, and provide at future appointments target a minimum of 150 minutes of moderate intensity exercise weekly engage in dietary modifications by reducing carbohydrate portion sizes          Plan: Video visit scheduled for 5 weeks   Catie Feliz Beam, PharmD, [place], CPP Clinical Pharmacist [facility] at [facility] [phone]         Please call the care guide team at [phone] if you need to cancel or reschedule your appointment.   Patient verbalizes understanding of instructions provided today and agrees to view in MyChart.

## 2021-12-29 ENCOUNTER — Telehealth: Payer: Self-pay | Admitting: Pharmacist

## 2021-12-29 NOTE — Telephone Encounter (Signed)
Received MyChart message from patient. He connected to clinic LibreView, started using Libre 3 on 12/29. Readings have been >250 100% of the time.   He is on metformin 1000 mg twice daily and is to increase to 0.5 mg weekly of Ozempic with next injection. Prior to restarting Ozempic, he had been off GLP1 therapy for ~2 weeks.   Asked about symptoms of GI distress, polyuria, polydipsia, polyphagia, blurred vision. Anticipate improvement with plan to increase Ozempic with next injection. If Ozempic is not tolerated, will plan to initiate basal insulin therapy.   Catie Feliz Beam, PharmD, [place], CPP Clinical Pharmacist [employer] at [employer] [phone]

## 2021-12-30 NOTE — Telephone Encounter (Signed)
Thank you :)

## 2021-12-30 NOTE — Telephone Encounter (Signed)
Communicated with patient. Reports improvement in GI upset, polydipsia. Encouraged continued hydration and monitoring of glucose. Encouraged to contact me if glucoses do not continue to improve.

## 2022-01-01 ENCOUNTER — Ambulatory Visit
Admission: EM | Admit: 2022-01-01 | Discharge: 2022-01-01 | Disposition: A | Discharge status: Home / Self Care | Payer: 59 | Attending: Family Medicine | Admitting: Family Medicine

## 2022-01-01 ENCOUNTER — Encounter: Payer: Self-pay | Admitting: Emergency Medicine

## 2022-01-01 DIAGNOSIS — S86911A Strain of unspecified muscle(s) and tendon(s) at lower leg level, right leg, initial encounter: Secondary | ICD-10-CM

## 2022-01-01 MED ORDER — KETOROLAC TROMETHAMINE 10 MG PO TABS: 10.0000 mg | ORAL_TABLET | Freq: Two times a day (BID) | ORAL | 0 refills | Status: DC | PRN

## 2022-01-01 MED ORDER — KETOROLAC TROMETHAMINE 30 MG/ML IJ SOLN
30.0000 mg | Freq: Once | INTRAMUSCULAR | Status: AC
  Administered 2022-01-01: 30 mg via INTRAMUSCULAR

## 2022-01-01 NOTE — ED Provider Notes (Signed)
Jorge Wright    CSN: [id number] Arrival date & time: 01/01/22  8937      History   Chief Complaint Chief Complaint  Patient presents with   Knee Pain    HPI Jorge Wright is a 36 y.o. male.   HPI Patient presents today with right knee pain which he describes as aching and occasionally shooting pain radiating into the right thigh. He recalls twisting his right extremity slightly 5 days ago and pain initially resolved and only over the last 24 hours pain involving the lateral, posterior, and anterior knee has worsened. He has taken ibuprofen with minimum relief. He has iced knee overnight. No known history of chronic degenerative knee disease.  Past Medical History:  Diagnosis Date   Chicken pox    COVID-19    12/2020 sob mild, 05/21/21   Depression    Diabetes mellitus without complication ([place])    History of alcohol abuse    Hyperlipidemia    Hypertension     Patient Active Problem List   Diagnosis Date Noted   Vitamin B12 deficiency 07/13/2020   Elevated liver enzymes 07/08/2020   Hypertension associated with diabetes ([place]) 06/03/2020   Depression, recurrent ([place]) 06/03/2020   Vitamin D deficiency 08/29/2018   Hepatic steatosis 05/31/2017   Annual physical exam 04/22/2017   DM type 2 (diabetes mellitus, type 2) ([place]) 06/14/2016   HTN (hypertension) 06/14/2016   HLD (hyperlipidemia) 06/14/2016   Anxiety and depression 06/14/2016   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 Authorizing Provider  ketorolac (TORADOL) 10 MG tablet Take 1 tablet (10 mg total) by mouth every 12 (twelve) hours as needed for severe pain or moderate pain. 01/01/22  Yes Scot Jun, FNP  amLODipine (NORVASC) 2.5 MG tablet Take 1 tablet (2.5 mg total) by mouth daily. 08/03/21    McLean-Scocuzza, Nino Glow, MD  atorvastatin (LIPITOR) 40 MG tablet Take 1 tablet (40 mg total) by mouth daily. At night 03/10/21   McLean-Scocuzza, Nino Glow, MD  B-D 3CC LUER-LOK SYR 25GX1" 25G X 1" 3 ML MISC  07/31/21   [provider]  blood glucose meter kit and supplies KIT Dispense based on patient and insurance preference. Use up to four times daily as directed. 04/22/17   Coral Spikes, DO  Cholecalciferol (VITAMIN D) 50 MCG (2000 UT) CAPS Take 1,000 Units by mouth daily.    [provider]  Continuous Blood Gluc Sensor (FREESTYLE LIBRE 3 SENSOR) MISC Apply 1 each topically every 14 (fourteen) days. Place 1 sensor on the skin every 14 days. Use to check glucose continuously 12/22/21   McLean-Scocuzza, Nino Glow, MD  cyanocobalamin (,VITAMIN B-12,) 1000 MCG/ML injection Inject 1 mL (1,000 mcg total) into the muscle every 30 (thirty) days. 11/18/21   McLean-Scocuzza, Nino Glow, MD  escitalopram (LEXAPRO) 20 MG tablet Take 1 tablet (20 mg total) by mouth daily. 03/10/21   McLean-Scocuzza, Nino Glow, MD  ezetimibe (ZETIA) 10 MG tablet Take 1 tablet (10 mg total) by mouth daily. 02/11/21   McLean-Scocuzza, Nino Glow, MD  lisinopril-hydrochlorothiazide (ZESTORETIC) 20-12.5 MG tablet Take 2 tablets by mouth daily. In am 12/09/20   McLean-Scocuzza, Nino Glow, MD  LORazepam (ATIVAN) 0.5 MG tablet Take 1 tablet (0.5 mg total) by mouth daily as  needed for anxiety. 07/20/21   McLean-Scocuzza, Nino Glow, MD  metFORMIN (GLUCOPHAGE-XR) 500 MG 24 hr tablet Take 2 tablets (1,000 mg total) by mouth 2 (two) times daily. 12/22/21   McLean-Scocuzza, Nino Glow, MD  ONE TOUCH ULTRA TEST test strip  04/22/17   [provider]  Jonetta Speak LANCETS 10R MISC  04/22/17   [provider]  Semaglutide,0.25 or 0.5MG/DOS, (OZEMPIC, 0.25 OR 0.5 MG/DOSE,) 2 MG/1.5ML SOPN Inject 0.5 mg into the skin once a week. 12/22/21   McLean-Scocuzza, Nino Glow, MD  Syringe/Needle, Disp, 25G X 1" 10 ML MISC 1 Device by Does not  apply route every 30 (thirty) days. 07/31/21   McLean-Scocuzza, Nino Glow, MD    Family History Family History  Problem Relation Age of Onset   Alcohol abuse Mother    Hypertension Mother    Heart disease Mother        had heart attack 83    Diabetes Mother        type 2   Heart attack Mother 66   Alcohol abuse Father    Hypertension Father    Diabetes Father        type 1   Pancreatitis Father    Alcohol abuse Maternal Grandmother    Heart disease Maternal Grandmother        died in 1s    Hypertension Maternal Grandmother    Depression Maternal Grandmother    Stroke Paternal Grandfather    Heart attack Maternal Uncle        had heart attack     Social History Social History   Tobacco Use   Smoking status: Former   Smokeless tobacco: Former  Substance Use Topics   Alcohol use: Yes    Alcohol/week: 6.0 standard drinks    Types: 6 Standard drinks or equivalent per week   Drug use: Yes    Types: Marijuana     Allergies   Invokana [canagliflozin] and Penicillins   Review of Systems Review of Systems Pertinent negatives listed in HPI   Physical Exam Triage Vital Signs ED Triage Vitals  Enc Vitals Group     BP 01/01/22 0830 138/80     Pulse Rate 01/01/22 0830 67     Resp 01/01/22 0830 20     Temp 01/01/22 0830 98.8 F (37.1 C)     Temp Source 01/01/22 0830 Oral     SpO2 01/01/22 0830 (!) 20 %     Weight --      Height --      Head Circumference --      Peak Flow --      Pain Score 01/01/22 0833 8     Pain Loc --      Pain Edu? --      Excl. in [place]? --    No data found.  Updated Vital Signs BP 138/80    Pulse 67    Temp 98.8 F (37.1 C) (Oral)    Resp 20    SpO2 99%   Visual Acuity Right Eye Distance:   Left Eye Distance:   Bilateral Distance:    Right Eye Near:   Left Eye Near:    Bilateral Near:     Physical Exam HENT:     Head: Normocephalic and atraumatic.  Cardiovascular:     Rate and Rhythm: Normal rate and regular rhythm.   Pulmonary:     Effort: Pulmonary effort is normal.     Breath sounds: Normal breath sounds.  Musculoskeletal:     Right knee: Decreased range of motion. Tenderness present.     Comments: Limited extension and flexion involving the right knee   Skin:    Capillary Refill: Capillary refill takes less than 2 seconds.  Neurological:     General: No focal deficit present.     Mental Status: He is alert.  Psychiatric:        Mood and Affect: Mood normal.        Behavior: Behavior normal.        Thought Content: Thought content normal.        Judgment: Judgment normal.     UC Treatments / Results  Labs (all labs ordered are listed, but only abnormal results are displayed) Labs Reviewed - No data to display  EKG   Radiology No results found.  Procedures Procedures (including critical care time)  Medications Ordered in UC Medications  ketorolac (TORADOL) 30 MG/ML injection 30 mg (30 mg Intramuscular Given 01/01/22 0901)    Initial Impression / Assessment and Plan / UC Course  I have reviewed the triage vital signs and the nursing notes.  Pertinent labs & imaging results that were available during my care of the patient were reviewed by me and considered in my medical decision making (see chart for details).    Right knee strain, RICE, Toradol IM given here today. Continue oral Toradol.  Follow-up with Orthopedics if no improvement after  3 days.  Final Clinical Impressions(s) / UC Diagnoses   Final diagnoses:  Strain of right knee, initial encounter     Discharge Instructions      You received a Toradol injection here in clinic today. I am prescribing you oral toradol and take next dose tonight anytime after 8 pm. Keep ACE wrap on knee with all weight bearing activities. Elevate and Ice when non-weight bearing If pain has not resolved within 3 days, follow-up with [facility] for evaluation of a ligament injury. Avoid taking any Aleve or Ibuprofen while  taking Toradol. Ok to take Tylenol with Toradol.     ED Prescriptions     Medication Sig Dispense Auth. Provider   ketorolac (TORADOL) 10 MG tablet Take 1 tablet (10 mg total) by mouth every 12 (twelve) hours as needed for severe pain or moderate pain. 12 tablet Scot Jun, FNP      PDMP not reviewed this encounter.   Scot Jun, Daisy 01/01/22 [phone]

## 2022-01-01 NOTE — ED Triage Notes (Signed)
Pt here after waking up yesterday with 8/10 lateral right knee pain. Pain is sharp and shooting up to hip. States he walked a lot the day before.

## 2022-01-01 NOTE — Discharge Instructions (Addendum)
You received a Toradol injection here in clinic today. I am prescribing you oral toradol and take next dose tonight anytime after 8 pm. Keep ACE wrap on knee with all weight bearing activities. Elevate and Ice when non-weight bearing If pain has not resolved within 3 days, follow-up with [facility] for evaluation of a ligament injury. Avoid taking any Aleve or Ibuprofen while taking Toradol. Ok to take Tylenol with Toradol.

## 2022-01-04 ENCOUNTER — Encounter: Payer: Self-pay | Admitting: Internal Medicine

## 2022-01-04 DIAGNOSIS — F32A Depression, unspecified: Secondary | ICD-10-CM

## 2022-01-04 DIAGNOSIS — F419 Anxiety disorder, unspecified: Secondary | ICD-10-CM

## 2022-01-05 MED ORDER — LORAZEPAM 0.5 MG PO TABS: 0.5000 mg | ORAL_TABLET | Freq: Every day | ORAL | 2 refills | Status: DC | PRN

## 2022-01-19 ENCOUNTER — Encounter: Payer: Self-pay | Admitting: Internal Medicine

## 2022-01-20 NOTE — Telephone Encounter (Signed)
Please advise 

## 2022-01-21 ENCOUNTER — Telehealth: Payer: Self-pay | Admitting: Pharmacist

## 2022-01-21 ENCOUNTER — Other Ambulatory Visit: Payer: Self-pay

## 2022-01-21 DIAGNOSIS — E119 Type 2 diabetes mellitus without complications: Secondary | ICD-10-CM

## 2022-01-21 MED ORDER — OZEMPIC (0.25 OR 0.5 MG/DOSE) 2 MG/1.5ML ~~LOC~~ SOPN
0.5000 mg | PEN_INJECTOR | SUBCUTANEOUS | 2 refills | Status: DC
  Filled 2022-01-21 (×2): qty 1.5, 28d supply, fill #0

## 2022-01-21 NOTE — Telephone Encounter (Signed)
See MyChart message. Sending script to [facility] pharmacy as requested by patient .

## 2022-01-22 ENCOUNTER — Other Ambulatory Visit: Payer: Self-pay

## 2022-01-27 ENCOUNTER — Telehealth (INDEPENDENT_AMBULATORY_CARE_PROVIDER_SITE_OTHER): Payer: 59 | Admitting: Pharmacist

## 2022-01-27 ENCOUNTER — Other Ambulatory Visit: Payer: Self-pay | Admitting: Internal Medicine

## 2022-01-27 ENCOUNTER — Telehealth: Payer: Self-pay | Admitting: Pharmacist

## 2022-01-27 DIAGNOSIS — E119 Type 2 diabetes mellitus without complications: Secondary | ICD-10-CM

## 2022-01-27 DIAGNOSIS — I152 Hypertension secondary to endocrine disorders: Secondary | ICD-10-CM

## 2022-01-27 DIAGNOSIS — E1159 Type 2 diabetes mellitus with other circulatory complications: Secondary | ICD-10-CM | POA: Diagnosis not present

## 2022-01-27 DIAGNOSIS — E782 Mixed hyperlipidemia: Secondary | ICD-10-CM | POA: Diagnosis not present

## 2022-01-27 DIAGNOSIS — E559 Vitamin D deficiency, unspecified: Secondary | ICD-10-CM | POA: Diagnosis not present

## 2022-01-27 NOTE — Patient Instructions (Signed)
Jorge Wright,   Continue your current regimen. Let's see if we need to increase Ozempic to 1 mg weekly at our next call.   If you upcoming LDL is greater than 70, I recommend we increase atorvastatin to 80 mg daily and continue ezetimibe.   Take care!  Catie Darnelle Maffucci, PharmD

## 2022-01-27 NOTE — Progress Notes (Signed)
° °   ° °Chief Complaint  °Patient presents with  ° Diabetes  ° ° °Jorge Wright is a 36 y.o. year old male who was referred for medication management by their primary care provider, McLean-Scocuzza, Tracy N, MD. They presented for a virtual visit in the context of the COVID-19 pandemic. °  °I connected with Kedric on 01/27/22 at 10:22 am by video and verified that I am speaking with the correct person using two identifiers. °  °I discussed the limitations, risks, security and privacy concerns of performing an evaluation and management service by video and the availability of in person appointments. I also discussed with the patient that there may be a patient responsible charge related to this service. The patient expressed understanding and agreed to proceed. °  °Patient location:  home °My Location:  work °Persons on the video call:  myself and patient  ° °Subjective: °Diabetes: ° °Current medications: metformin 1000 mg twice daily, Ozempic 0.5 mg weekly x 3 weeks °Medications tried in the past: Mounjaro (GI upset at doses that did not show benefit); Hx Invokana - rash on trunk started about a week in, he continued to take for about a month, then stopped. Rash resolved with stopping the medication. Does not remember any throat involvement, however, likely avoid SGLT2 moving forward unless significant compelling comorbidity develops ° °Does report some GI upset with this dose of Ozempic, but tolerable. Decreased appetite and some queasiness.  ° °Current glucose readings: using Libre 3 CGM ° °Date of Download: 1/12-1/25/22 °% Time CGM is active: 100% °Average Glucose: 253 mg/dL °Glucose Management Indicator: 9.4%  °Glucose Variability: 22.5 (goal <36%) °Time in Goal:  °- Time in range 70-180: 9% °- Time above range: 91% °- Time below range: 0% °Observed patterns: elevated, but improving in the latter end of the period  ° °Reports [facility] still has not received shipment of Ozempic and his insurance will not  cover it at [facility]  ° °Hyperlipidemia/ASCVD Risk Reduction ° °Current lipid lowering medications: atorvastatin 40 mg, ezetimibe 10 mg  °  ° °Objective: ° ° °Lab Results  °Component Value Date  ° HGBA1C 8.7 (H) 07/31/2021  ° ° °Lab Results  °Component Value Date  ° CREATININE 1.25 07/31/2021  ° BUN 17 07/31/2021  ° NA 134 (L) 07/31/2021  ° K 4.2 07/31/2021  ° CL 101 07/31/2021  ° CO2 23 07/31/2021  ° ° °Lab Results  °Component Value Date  ° CHOL 156 07/31/2021  ° HDL 38.00 (L) 07/31/2021  ° LDLCALC 79 07/03/2019  ° LDLDIRECT 93.0 07/31/2021  ° TRIG 215.0 (H) 07/31/2021  ° CHOLHDL 4 07/31/2021  ° ° °Medications Reviewed Today   ° ° Reviewed by ,  E, RPH-CPP (Pharmacist) on 01/27/22 at 1042  Med List Status: <None>  ° °Medication Order Taking? Sig Documenting Provider Last Dose Status Informant  °amLODipine (NORVASC) 2.5 MG tablet [id number]  Take 1 tablet (2.5 mg total) by mouth daily. McLean-Scocuzza, Tracy N, MD  Active   °atorvastatin (LIPITOR) 40 MG tablet [id number]  Take 1 tablet (40 mg total) by mouth daily. At night McLean-Scocuzza, Tracy N, MD  Active   °B-D 3CC LUER-LOK SYR 25GX1" 25G X 1" 3 ML MISC [id number]   [provider]  Active   °blood glucose meter kit and supplies KIT [id number]  Dispense based on patient and insurance preference. Use up to four times daily as directed. Cook, Jayce G, DO  Active   °Cholecalciferol (VITAMIN D) 50 MCG (2000   UT) CAPS [id number]  Take 1,000 Units by mouth daily. [provider]  Active   °Continuous Blood Gluc Sensor (FREESTYLE LIBRE 3 SENSOR) MISC [id number]  Apply 1 each topically every 14 (fourteen) days. Place 1 sensor on the skin every 14 days. Use to check glucose continuously McLean-Scocuzza, Tracy N, MD  Active   °cyanocobalamin (,VITAMIN B-12,) 1000 MCG/ML injection [id number]  Inject 1 mL (1,000 mcg total) into the muscle every 30 (thirty) days. McLean-Scocuzza, Tracy N, MD  Active   °escitalopram (LEXAPRO) 20 MG tablet [id number]   Take 1 tablet (20 mg total) by mouth daily. McLean-Scocuzza, Tracy N, MD  Active   °ezetimibe (ZETIA) 10 MG tablet [id number]  Take 1 tablet (10 mg total) by mouth daily. McLean-Scocuzza, Tracy N, MD  Active   °ketorolac (TORADOL) 10 MG tablet [id number]  Take 1 tablet (10 mg total) by mouth every 12 (twelve) hours as needed for severe pain or moderate pain. Harris, Kimberly S, FNP  Active   °lisinopril-hydrochlorothiazide (ZESTORETIC) 20-12.5 MG tablet [id number]  Take 2 tablets by mouth daily. In am McLean-Scocuzza, Tracy N, MD  Active   °LORazepam (ATIVAN) 0.5 MG tablet [id number]  Take 1 tablet (0.5 mg total) by mouth daily as needed for anxiety. McLean-Scocuzza, Tracy N, MD  Active   °metFORMIN (GLUCOPHAGE-XR) 500 MG 24 hr tablet [id number] Yes Take 2 tablets (1,000 mg total) by mouth 2 (two) times daily. McLean-Scocuzza, Tracy N, MD Taking Active   °ONE TOUCH ULTRA TEST test strip [id number]   [provider]  Active   °ONETOUCH DELICA LANCETS 33G MISC [id number]   [provider]  Active   °Semaglutide,0.25 or 0.5MG/DOS, (OZEMPIC, 0.25 OR 0.5 MG/DOSE,) 2 MG/1.5ML SOPN [id number] Yes Inject 0.5 mg into the skin once a week. McLean-Scocuzza, Tracy N, MD Taking Active   °Syringe/Needle, Disp, 25G X 1" 10 ML MISC [id number]  1 Device by Does not apply route every 30 (thirty) days. McLean-Scocuzza, Tracy N, MD  Active   ° °  °  ° °  ° ° °Assessment/Plan:  ° °Diabetes: °- Currently uncontrolled but improving  °- Reviewed long term cardiovascular and renal outcomes of uncontrolled blood sugar °- Reviewed goal time in range of >70% °- Given GI symptoms, continue Ozempic 0.5 mg weekly at this time along with metformin 1000 mg twice daily.  °- Continue use of CGM. Follow up with PCP next week as scheduled ° °Hyperlipidemia/ASCVD Risk Reduction: °- Currently uncontrolled.  °- Reviewed long term complications of uncontrolled cholesterol °- Reviewed goal LDL <70 in DM °- Recommend to check lipids next week. If  LDL >70, increase atorvastatin to 80 mg daily. Continue ezetimibe. Discussed this with patient.  ° °Follow Up Plan: video visit in 5 weeks ° °Catie , PharmD, BCACP, CPP °Clinical Pharmacist ° [facility] °[phone] ° ° ° °

## 2022-01-27 NOTE — Addendum Note (Signed)
Addended by: De Hollingshead on: 01/27/2022 01:59 PM   Modules accepted: Orders

## 2022-01-27 NOTE — Telephone Encounter (Signed)
Ozempic picked up

## 2022-01-27 NOTE — Telephone Encounter (Signed)
Added

## 2022-01-27 NOTE — Telephone Encounter (Signed)
Under your orders not to confuse the lab can we add on b12 and cbc with diff  Thank you

## 2022-01-27 NOTE — Telephone Encounter (Signed)
Patient was scheduled for fasting labs before upcoming PCP appointment. Ordered lipids, direct LDL (given hx high TG), CMP, A1c, Vit D (as low on last check). Please add any additional labs desired.       Medication Samples have been labeled and logged for the patient.  Drug name: Ozempic       Strength: 2 mg/1.5 mL        Qty: 1 pen  LOT: [id number]  Exp.Date: 05/26/2024  Dosing instructions: Inject 0.5 mg weekly  The patient has been instructed regarding the correct time, dose, and frequency of taking this medication, including desired effects and most common side effects.   Jorge Wright 10:37 AM 01/27/2022

## 2022-02-01 ENCOUNTER — Other Ambulatory Visit: Payer: Self-pay

## 2022-02-01 ENCOUNTER — Other Ambulatory Visit (INDEPENDENT_AMBULATORY_CARE_PROVIDER_SITE_OTHER): Payer: 59

## 2022-02-01 DIAGNOSIS — E119 Type 2 diabetes mellitus without complications: Secondary | ICD-10-CM | POA: Diagnosis not present

## 2022-02-01 DIAGNOSIS — E1159 Type 2 diabetes mellitus with other circulatory complications: Secondary | ICD-10-CM | POA: Diagnosis not present

## 2022-02-01 DIAGNOSIS — E559 Vitamin D deficiency, unspecified: Secondary | ICD-10-CM | POA: Diagnosis not present

## 2022-02-01 DIAGNOSIS — E782 Mixed hyperlipidemia: Secondary | ICD-10-CM | POA: Diagnosis not present

## 2022-02-01 DIAGNOSIS — I152 Hypertension secondary to endocrine disorders: Secondary | ICD-10-CM | POA: Diagnosis not present

## 2022-02-01 LAB — COMPREHENSIVE METABOLIC PANEL
ALT: 63 U/L — ABNORMAL HIGH (ref 0–53)
AST: 37 U/L (ref 0–37)
Albumin: 4.5 g/dL (ref 3.5–5.2)
Alkaline Phosphatase: 81 U/L (ref 39–117)
BUN: 16 mg/dL (ref 6–23)
CO2: 27 mEq/L (ref 19–32)
Calcium: 9.6 mg/dL (ref 8.4–10.5)
Chloride: 99 mEq/L (ref 96–112)
Creatinine, Ser: 1.02 mg/dL (ref 0.40–1.50)
GFR: 94.38 mL/min (ref >60.00)
Glucose, Bld: 182 mg/dL — ABNORMAL HIGH (ref 70–99)
Potassium: 4.6 mEq/L (ref 3.5–5.1)
Sodium: 136 mEq/L (ref 135–145)
Total Bilirubin: 0.4 mg/dL (ref 0.2–1.2)
Total Protein: 7.2 g/dL (ref 6.0–8.3)

## 2022-02-01 LAB — CBC WITH DIFFERENTIAL/PLATELET
Basophils Absolute: 0.1 10*3/uL (ref 0.0–0.1)
Basophils Relative: 0.5 % (ref 0.0–3.0)
Eosinophils Absolute: 0.1 10*3/uL (ref 0.0–0.7)
Eosinophils Relative: 0.7 % (ref 0.0–5.0)
HCT: 41.5 % (ref 39.0–52.0)
Hemoglobin: 13.4 g/dL (ref 13.0–17.0)
Lymphocytes Relative: 25.4 % (ref 12.0–46.0)
Lymphs Abs: 2.8 10*3/uL (ref 0.7–4.0)
MCHC: 32.2 g/dL (ref 30.0–36.0)
MCV: 84.8 fl (ref 78.0–100.0)
Monocytes Absolute: 0.6 10*3/uL (ref 0.1–1.0)
Monocytes Relative: 5.6 % (ref 3.0–12.0)
Neutro Abs: 7.6 10*3/uL (ref 1.4–7.7)
Neutrophils Relative %: 67.8 % (ref 43.0–77.0)
Platelets: 277 10*3/uL (ref 150.0–400.0)
RBC: 4.89 Mil/uL (ref 4.22–5.81)
RDW: 12.9 % (ref 11.5–15.5)
WBC: 11.2 10*3/uL — ABNORMAL HIGH (ref 4.0–10.5)

## 2022-02-01 LAB — LIPID PANEL
Cholesterol: 138 mg/dL (ref 0–200)
HDL: 31.9 mg/dL — ABNORMAL LOW (ref >39.00)
NonHDL: 106.39
Total CHOL/HDL Ratio: 4
Triglycerides: 337 mg/dL — ABNORMAL HIGH (ref 0.0–149.0)
VLDL: 67.4 mg/dL — ABNORMAL HIGH (ref 0.0–40.0)

## 2022-02-01 LAB — VITAMIN B12: Vitamin B-12: 336 pg/mL (ref 211–911)

## 2022-02-01 LAB — LDL CHOLESTEROL, DIRECT: Direct LDL: 71 mg/dL

## 2022-02-01 LAB — HEMOGLOBIN A1C: Hgb A1c MFr Bld: 12.1 % — ABNORMAL HIGH (ref 4.6–6.5)

## 2022-02-01 LAB — VITAMIN D 25 HYDROXY (VIT D DEFICIENCY, FRACTURES): VITD: 22.69 ng/mL — ABNORMAL LOW (ref 30.00–100.00)

## 2022-02-02 ENCOUNTER — Other Ambulatory Visit: Payer: Self-pay | Admitting: Internal Medicine

## 2022-02-02 DIAGNOSIS — E559 Vitamin D deficiency, unspecified: Secondary | ICD-10-CM

## 2022-02-02 MED ORDER — CHOLECALCIFEROL 1.25 MG (50000 UT) PO CAPS: 50000.0000 [IU] | ORAL_CAPSULE | ORAL | 3 refills | Status: DC

## 2022-02-04 ENCOUNTER — Other Ambulatory Visit: Payer: Self-pay

## 2022-02-04 ENCOUNTER — Ambulatory Visit (INDEPENDENT_AMBULATORY_CARE_PROVIDER_SITE_OTHER): Payer: 59 | Admitting: Internal Medicine

## 2022-02-04 ENCOUNTER — Encounter: Payer: Self-pay | Admitting: Internal Medicine

## 2022-02-04 VITALS — BP 118/74 | HR 102 | Temp 98.0°F | Ht 72.0 in | Wt 224.2 lb

## 2022-02-04 DIAGNOSIS — R110 Nausea: Secondary | ICD-10-CM

## 2022-02-04 DIAGNOSIS — E559 Vitamin D deficiency, unspecified: Secondary | ICD-10-CM | POA: Diagnosis not present

## 2022-02-04 DIAGNOSIS — I152 Hypertension secondary to endocrine disorders: Secondary | ICD-10-CM

## 2022-02-04 DIAGNOSIS — Z23 Encounter for immunization: Secondary | ICD-10-CM

## 2022-02-04 DIAGNOSIS — E1159 Type 2 diabetes mellitus with other circulatory complications: Secondary | ICD-10-CM | POA: Diagnosis not present

## 2022-02-04 MED ORDER — SEMAGLUTIDE (1 MG/DOSE) 4 MG/3ML ~~LOC~~ SOPN: 1.0000 mg | PEN_INJECTOR | SUBCUTANEOUS | 3 refills | Status: DC

## 2022-02-04 MED ORDER — ONDANSETRON HCL 4 MG PO TABS: 4.0000 mg | ORAL_TABLET | Freq: Two times a day (BID) | ORAL | 3 refills | Status: DC | PRN

## 2022-02-04 MED ORDER — FENOFIBRATE 145 MG PO TABS: 145.0000 mg | ORAL_TABLET | Freq: Every day | ORAL | 3 refills | Status: DC

## 2022-02-04 NOTE — Progress Notes (Signed)
Chief Complaint  Patient presents with   Follow-up    DM2     F/u  1. Dm 2 12.1 on ozempic 0.5 weekly metformin xr 1000 mg bid cbg 153-155  2. Hld zetia 10 mg qd lipitor 40 mg qhs   Review of Systems  Constitutional:  Negative for weight loss.  HENT:  Negative for hearing loss.   Eyes:  Negative for blurred vision.  Respiratory:  Negative for shortness of breath.   Cardiovascular:  Negative for chest pain.  Gastrointestinal:  Negative for abdominal pain and blood in stool.  Musculoskeletal:  Negative for back pain.  Skin:  Negative for rash.  Neurological:  Negative for headaches.  Psychiatric/Behavioral:  Negative for depression.   Past Medical History:  Diagnosis Date   Chicken pox    COVID-19    12/2020 sob mild, 05/21/21   Depression    Diabetes mellitus without complication ([place])    History of alcohol abuse    Hyperlipidemia    Hypertension    Past Surgical History:  Procedure Laterality Date   MANDIBLE FRACTURE SURGERY     Family History  Problem Relation Age of Onset   Alcohol abuse Mother    Hypertension Mother    Heart disease Mother        had heart attack 55    Diabetes Mother        type 2   Heart attack Mother 10   Alcohol abuse Father    Hypertension Father    Diabetes Father        type 1   Pancreatitis Father    Alcohol abuse Maternal Grandmother    Heart disease Maternal Grandmother        died in 48s    Hypertension Maternal Grandmother    Depression Maternal Grandmother    Stroke Paternal Grandfather    Heart attack Maternal Uncle        had heart attack    Social History   Socioeconomic History   Marital status: Married    Spouse name: Not on file   Number of children: Not on file   Years of education: Not on file   Highest education level: Not on file  Occupational History   Not on file  Tobacco Use   Smoking status: Former   Smokeless tobacco: Former  Substance and Sexual Activity   Alcohol use: Yes    Alcohol/week: 6.0  standard drinks    Types: 6 Standard drinks or equivalent per week   Drug use: Yes    Types: Marijuana   Sexual activity: Not on file  Other Topics Concern   Not on file  Social History Narrative   Works IT    Married    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   atorvastatin (LIPITOR) 40 MG tablet Take 1 tablet (40 mg total) by mouth daily. At night   B-D 3CC LUER-LOK SYR 25GX1" 25G X 1" 3 ML MISC    blood glucose meter kit and supplies KIT  Dispense based on patient and insurance preference. Use up to four times daily as directed.   Cholecalciferol 1.25 MG (50000 UT) capsule Take 1 capsule (50,000 Units total) by mouth once a week. d3   Continuous Blood Gluc Sensor (FREESTYLE LIBRE 3 SENSOR) MISC Apply 1 each topically every 14 (fourteen) days. Place 1 sensor on the skin every 14 days. Use to check glucose continuously   cyanocobalamin (,VITAMIN B-12,) 1000 MCG/ML injection Inject 1 mL (1,000 mcg total) into the muscle every 30 (thirty) days.   escitalopram (LEXAPRO) 20 MG tablet Take 1 tablet (20 mg total) by mouth daily.   fenofibrate (TRICOR) 145 MG tablet Take 1 tablet (145 mg total) by mouth daily. D/c zetia 10   ketorolac (TORADOL) 10 MG tablet Take 1 tablet (10 mg total) by mouth every 12 (twelve) hours as needed for severe pain or moderate pain.   lisinopril-hydrochlorothiazide (ZESTORETIC) 20-12.5 MG tablet TAKE 2 TABLETS DAILY IN THE MORNING (INCREASED DOSE, DISCONTINUE PREVIOUS PRESCRIPTION)   LORazepam (ATIVAN) 0.5 MG tablet Take 1 tablet (0.5 mg total) by mouth  daily as needed for anxiety.   metFORMIN (GLUCOPHAGE-XR) 500 MG 24 hr tablet Take 2 tablets (1,000 mg total) by mouth 2 (two) times daily.   ondansetron (ZOFRAN) 4 MG tablet Take 1 tablet (4 mg total) by mouth 2 (two) times daily as needed.   ONE TOUCH ULTRA TEST test strip    ONETOUCH DELICA LANCETS 57S MISC    Semaglutide, 1 MG/DOSE, 4 MG/3ML SOPN Inject 1 mg as directed once a week.   Syringe/Needle, Disp, 25G X 1" 10 ML MISC 1 Device by Does not apply route every 30 (thirty) days.   [DISCONTINUED] ezetimibe (ZETIA) 10 MG tablet Take 1 tablet (10 mg total) by mouth daily.   [DISCONTINUED] Semaglutide,0.25 or 0.5MG/DOS, (OZEMPIC, 0.25 OR 0.5 MG/DOSE,) 2 MG/1.5ML SOPN Inject 0.5 mg into the skin once a week.   Allergies  Allergen Reactions   Mounjaro [Tirzepatide]     Injection site reaction    Invokana [Canagliflozin] Rash    Reports topical rash on trunk that occurred after taking Invokana for about a week. Resolved after medication discontinuation. No airway involvement.    Penicillins Rash   Recent Results (from the past 2160 hour(s))  CBC w/Diff     Status: Abnormal   Collection Time: 02/01/22  8:57 AM  Result Value Ref Range   WBC 11.2 (H) 4.0 - 10.5 K/uL   RBC 4.89 4.22 - 5.81 Mil/uL   Hemoglobin 13.4 13.0 - 17.0 g/dL   HCT 41.5 39.0 - 52.0 %   MCV 84.8 78.0 - 100.0 fl   MCHC 32.2 30.0 - 36.0 g/dL   RDW 12.9 11.5 - 15.5 %   Platelets 277.0 150.0 - 400.0 K/uL   Neutrophils Relative % 67.8 43.0 - 77.0 %   Lymphocytes Relative 25.4 12.0 - 46.0 %   Monocytes Relative 5.6 3.0 - 12.0 %   Eosinophils Relative 0.7 0.0 - 5.0 %   Basophils Relative 0.5 0.0 - 3.0 %   Neutro Abs 7.6 1.4 - 7.7 K/uL   Lymphs Abs 2.8 0.7 - 4.0 K/uL   Monocytes Absolute 0.6 0.1 - 1.0 K/uL   Eosinophils Absolute 0.1 0.0 - 0.7 K/uL   Basophils Absolute 0.1 0.0 - 0.1 K/uL  B12     Status: None   Collection Time: 02/01/22  8:57 AM  Result Value Ref Range   Vitamin B-12 336 211 - 911 pg/mL  Direct LDL  Status: None   Collection Time: 02/01/22  8:57 AM  Result Value Ref Range   Direct LDL 71.0 mg/dL    Comment: Optimal:  <100 mg/dLNear or Above Optimal:  100-129 mg/dLBorderline High:  130-159 mg/dLHigh:  160-189 mg/dLVery High:  >190 mg/dL  Lipid Profile     Status: Abnormal   Collection Time: 02/01/22  8:57 AM  Result Value Ref Range   Cholesterol 138 0 - 200 mg/dL    Comment: ATP III Classification       Desirable:  < 200 mg/dL               Borderline High:  200 - 239 mg/dL          High:  > = 240 mg/dL   Triglycerides 337.0 (H) 0.0 - 149.0 mg/dL    Comment: Normal:  <150 mg/dLBorderline High:  150 - 199 mg/dL   HDL 31.90 (L) >39.00 mg/dL   VLDL 67.4 (H) 0.0 - 40.0 mg/dL   Total CHOL/HDL Ratio 4     Comment:                Men          Women1/2 Average Risk     3.4          3.3Average Risk          5.0          4.42X Average Risk          9.6          7.13X Average Risk          15.0          11.0                       NonHDL 106.39     Comment: NOTE:  Non-HDL goal should be 30 mg/dL higher than patient's LDL goal (i.e. LDL goal of < 70 mg/dL, would have non-HDL goal of < 100 mg/dL)  Vitamin D (25 hydroxy)     Status: Abnormal   Collection Time: 02/01/22  8:57 AM  Result Value Ref Range   VITD 22.69 (L) 30.00 - 100.00 ng/mL  Comp Met (CMET)     Status: Abnormal   Collection Time: 02/01/22  8:57 AM  Result Value Ref Range   Sodium 136 135 - 145 mEq/L   Potassium 4.6 3.5 - 5.1 mEq/L   Chloride 99 96 - 112 mEq/L   CO2 27 19 - 32 mEq/L   Glucose, Bld 182 (H) 70 - 99 mg/dL   BUN 16 6 - 23 mg/dL   Creatinine, Ser 1.02 0.40 - 1.50 mg/dL   Total Bilirubin 0.4 0.2 - 1.2 mg/dL   Alkaline Phosphatase 81 39 - 117 U/L   AST 37 0 - 37 U/L   ALT 63 (H) 0 - 53 U/L   Total Protein 7.2 6.0 - 8.3 g/dL   Albumin 4.5 3.5 - 5.2 g/dL   GFR 94.38 >60.00 mL/min    Comment: Calculated using the CKD-EPI Creatinine Equation (2021)   Calcium 9.6 8.4 - 10.5 mg/dL  HgB A1c     Status: Abnormal    Collection Time: 02/01/22  8:57 AM  Result Value Ref Range   Hgb A1c MFr Bld 12.1 (H) 4.6 - 6.5 %    Comment: Glycemic Control Guidelines for People with Diabetes:Non Diabetic:  <6%Goal of Therapy: <7%Additional Action Suggested:  >8%    Objective  Body mass  index is 30.41 kg/m. Wt Readings from Last 3 Encounters:  02/04/22 224 lb 3.2 oz (101.7 kg)  07/31/21 232 lb 8 oz (105.5 kg)  03/10/21 248 lb (112.5 kg)   Temp Readings from Last 3 Encounters:  02/04/22 98 F (36.7 C) (Oral)  01/01/22 98.8 F (37.1 C) (Oral)  07/31/21 98.7 F (37.1 C)   BP Readings from Last 3 Encounters:  02/04/22 118/74  01/01/22 138/80  07/31/21 (!) 140/98   Pulse Readings from Last 3 Encounters:  02/04/22 (!) 102  01/01/22 67  07/31/21 (!) 107    Physical Exam Vitals and nursing note reviewed.  Constitutional:      Appearance: Normal appearance. He is well-developed and well-groomed.  HENT:     Head: Normocephalic and atraumatic.  Eyes:     Conjunctiva/sclera: Conjunctivae normal.     Pupils: Pupils are equal, round, and reactive to light.  Cardiovascular:     Rate and Rhythm: Normal rate and regular rhythm.     Heart sounds: Normal heart sounds.  Pulmonary:     Effort: Pulmonary effort is normal. No respiratory distress.     Breath sounds: Normal breath sounds.  Abdominal:     Tenderness: There is no abdominal tenderness.  Skin:    General: Skin is warm and moist.  Neurological:     General: No focal deficit present.     Mental Status: He is alert and oriented to person, place, and time. Mental status is at baseline.     Sensory: Sensation is intact.     Motor: Motor function is intact.     Coordination: Coordination is intact.     Gait: Gait is intact. Gait normal.  Psychiatric:        Attention and Perception: Attention and perception normal.        Mood and Affect: Mood and affect normal.        Speech: Speech normal.        Behavior: Behavior normal. Behavior is cooperative.         Thought Content: Thought content normal.        Cognition and Memory: Cognition and memory normal.        Judgment: Judgment normal.    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 Olivia Mackie McLean-Scocuzza-Internal Medicine

## 2022-02-04 NOTE — Addendum Note (Signed)
Addended by: [place], Tahj Lindseth on: 02/04/2022 10:07 AM   Modules accepted: Orders

## 2022-02-04 NOTE — Patient Instructions (Addendum)
Due for eye exam Dr. Kerin Ransom   Latest Reference Range & Units 07/08/20 09:00 02/09/21 08:24 07/31/21 09:47 02/01/22 08:57  Hemoglobin A1C 4.6 - 6.5 % 6.8 (H) 7.7 (H) 8.7 (H) 12.1 (H)  (H): Data is abnormally high  Cinnamon supplements   500 mg    [facility] as given the info before for both  [facility] [place]  [place] [id number] [phone]    [facility] [place]  [address]  [place] [address]  [phone]     Pneumococcal Conjugate Vaccine (Prevnar 13) Suspension for Injection What is this medication? PNEUMOCOCCAL VACCINE (NEU mo KOK al vak SEEN) is a vaccine used to prevent pneumococcus bacterial infections. These bacteria can cause serious infections like pneumonia, meningitis, and blood infections. This vaccine will lower your chance of getting pneumonia. If you do get pneumonia, it can make your symptoms milder and your illness shorter. This vaccine will not treat an infection and will not cause infection. This vaccine is recommended for infants and young children, adults with certain medical conditions, and adults 58 years or older. This medicine may be used for other purposes; ask your health care provider or pharmacist if you have questions. COMMON BRAND NAME(S): Prevnar, Prevnar 13 What should I tell my care team before I take this medication? They need to know if you have any of these conditions: bleeding problems fever immune system problems an unusual or allergic reaction to pneumococcal vaccine, diphtheria toxoid, other vaccines, latex, other medicines, foods, dyes, or preservatives pregnant or trying to get pregnant breast-feeding How should I use this medication? This vaccine is for injection into a muscle. It is given by a health care professional. A copy of Vaccine Information Statements will be given before each vaccination. Read this sheet carefully each  time. The sheet may change frequently. Talk to your pediatrician regarding the use of this medicine in children. While this drug may be prescribed for children as young as 52 weeks old for selected conditions, precautions do apply. Overdosage: If you think you have taken too much of this medicine contact a poison control center or emergency room at once. NOTE: This medicine is only for you. Do not share this medicine with others. What if I miss a dose? It is important not to miss your dose. Call your doctor or health care professional if you are unable to keep an appointment. What may interact with this medication? medicines for cancer chemotherapy medicines that suppress your immune function steroid medicines like prednisone or cortisone This list may not describe all possible interactions. Give your health care provider a list of all the medicines, herbs, non-prescription drugs, or dietary supplements you use. Also tell them if you smoke, drink alcohol, or use illegal drugs. Some items may interact with your medicine. What should I watch for while using this medication? Mild fever and pain should go away in 3 days or less. Report any unusual symptoms to your doctor or health care professional. What side effects may I notice from receiving this medication? Side effects that you should report to your doctor or health care professional as soon as possible: allergic reactions like skin rash, itching or hives, swelling of the face, lips, or tongue breathing problems confused fast or irregular heartbeat fever over 102 degrees F seizures unusual bleeding or bruising unusual muscle weakness Side effects that usually do not require medical attention (report to your doctor or health  care professional if they continue or are bothersome): aches and pains diarrhea fever of 102 degrees F or less headache irritable loss of appetite pain, tender at site where injected trouble sleeping This list may  not describe all possible side effects. Call your doctor for medical advice about side effects. You may report side effects to FDA at 1-800-FDA-1088. Where should I keep my medication? This does not apply. This vaccine is given in a clinic, pharmacy, doctor's office, or other health care setting and will not be stored at home. NOTE: This sheet is a summary. It may not cover all possible information. If you have questions about this medicine, talk to your doctor, pharmacist, or health care provider.  2022 Elsevier/Gold Standard (2014-09-19 00:00:00)

## 2022-03-02 ENCOUNTER — Telehealth: Payer: Self-pay | Admitting: Internal Medicine

## 2022-03-02 NOTE — Telephone Encounter (Signed)
Error appt scheduled

## 2022-03-03 ENCOUNTER — Telehealth (INDEPENDENT_AMBULATORY_CARE_PROVIDER_SITE_OTHER): Payer: 59 | Admitting: Pharmacist

## 2022-03-03 DIAGNOSIS — E119 Type 2 diabetes mellitus without complications: Secondary | ICD-10-CM | POA: Diagnosis not present

## 2022-03-03 NOTE — Patient Instructions (Signed)
Jorge Wright,  ? ?Keep up the great work! If you continue to have queasiness, you can click back down about 10 clicks from 1 mg, that would be a dose in between 0.5 and 1 mg.  ? ?Let Dr. French Ana know if any issues arise and you need sooner follow up.  ? ?Please make sure you schedule your yearly diabetic eye exam.  ? ?Take care! ? ?Catie Feliz Beam, PharmD ?

## 2022-03-03 NOTE — Progress Notes (Signed)
? ?   ? ?Chief Complaint  ?Patient presents with  ? Diabetes  ? ? ?Jorge Wright is a 36 y.o. year old male who was referred for medication management by their primary care provider, McLean-Scocuzza, Nino Glow, MD. They presented for a virtual visit in the context of the COVID-19 pandemic. ?  ?I connected with Dorothea Ogle on 03/03/22 at 9:20 by video and verified that I am speaking with the correct person using two identifiers. ?  ?I discussed the limitations, risks, security and privacy concerns of performing an evaluation and management service by video and the availability of in person appointments. I also discussed with the patient that there may be a patient responsible charge related to this service. The patient expressed understanding and agreed to proceed. ?  ?Patient location:  home ?My Location:  clinic ?Persons on the video call:  myself and patient  ? ?Subjective: ?Diabetes: ? ?Current medications: metformin XR 1000 mg twice daily, Ozempic 1 mg weekly  ?Medications tried in the past: Mounjaro (GI upset at doses that did not show benefit); Hx Invokana - rash on trunk started about a week in, he continued to take for about a month, then stopped. Rash resolved with stopping the medication. Does not remember any throat involvement, however, likely avoid SGLT2 moving forward unless significant compelling comorbidity develops ? ?Current glucose readings: using Libre 2 CGM ? ?Date of Download: 2/23-03/03/22 ?% Time CGM is active: 70% ?Average Glucose: 134 mg/dL ?Glucose Management Indicator: 6.5  ?Glucose Variability: 21.5 (goal <36%) ?Time in Goal:  ?- Time in range 70-180: 92% ?- Time above range: 8% ?- Time below range: 0% ?Observed patterns: generally well controlled ? ? ?Patient reports hypoglycemic s/sx including dizziness, fatigue, but few episodes of glucose <70. Does report some GI upset, nausea since increasing the dose, unrelieved by ondansetron. Reports it has improved in the past week. ?   ? ?Objective: ?Lab Results  ?Component Value Date  ? HGBA1C 12.1 (H) 02/01/2022  ? ? ?Lab Results  ?Component Value Date  ? CREATININE 1.02 02/01/2022  ? BUN 16 02/01/2022  ? NA 136 02/01/2022  ? K 4.6 02/01/2022  ? CL 99 02/01/2022  ? CO2 27 02/01/2022  ? ? ?Lab Results  ?Component Value Date  ? CHOL 138 02/01/2022  ? HDL 31.90 (L) 02/01/2022  ? [place] 79 07/03/2019  ? LDLDIRECT 71.0 02/01/2022  ? TRIG 337.0 (H) 02/01/2022  ? CHOLHDL 4 02/01/2022  ? ? ?Medications Reviewed Today   ? ? Reviewed by De Hollingshead, RPH-CPP (Pharmacist) on 03/03/22 at [phone]  Med List Status: <None>  ? ?Medication Order Taking? Sig Documenting Provider Last Dose Status Informant  ?amLODipine (NORVASC) 2.5 MG tablet [id number]  Take 1 tablet (2.5 mg total) by mouth daily. McLean-Scocuzza, Nino Glow, MD  Active   ?atorvastatin (LIPITOR) 40 MG tablet [id number]  Take 1 tablet (40 mg total) by mouth daily. At night McLean-Scocuzza, Nino Glow, MD  Active   ?B-D 3CC LUER-LOK SYR 25GX1" 25G X 1" 3 ML MISC [id number]   [provider]  Active   ?blood glucose meter kit and supplies KIT [id number]  Dispense based on patient and insurance preference. Use up to four times daily as directed. Coral Spikes, DO  Active   ?Cholecalciferol 1.25 MG (50000 UT) capsule [id number]  Take 1 capsule (50,000 Units total) by mouth once a week. d3 McLean-Scocuzza, Nino Glow, MD  Active   ?Continuous Blood Gluc Sensor (FREESTYLE LIBRE 3 SENSOR) [place] [id number]  Apply 1 each topically every 14 (fourteen) days. Place 1 sensor on the skin every 14 days. Use to check glucose continuously McLean-Scocuzza, Nino Glow, MD  Active   ?cyanocobalamin (,VITAMIN B-12,) 1000 MCG/ML injection [id number]  Inject 1 mL (1,000 mcg total) into the muscle every 30 (thirty) days. McLean-Scocuzza, Nino Glow, MD  Active   ?escitalopram (LEXAPRO) 20 MG tablet [id number]  Take 1 tablet (20 mg total) by mouth daily. McLean-Scocuzza, Nino Glow, MD  Active   ?fenofibrate (TRICOR) 145 MG tablet  [id number]  Take 1 tablet (145 mg total) by mouth daily. D/c zetia 10 McLean-Scocuzza, Nino Glow, MD  Active   ?ketorolac (TORADOL) 10 MG tablet [id number]  Take 1 tablet (10 mg total) by mouth every 12 (twelve) hours as needed for severe pain or moderate pain. Scot Jun, FNP  Active   ?lisinopril-hydrochlorothiazide (ZESTORETIC) 20-12.5 MG tablet [id number]  TAKE 2 TABLETS DAILY IN THE MORNING (INCREASED DOSE, DISCONTINUE PREVIOUS PRESCRIPTION) McLean-Scocuzza, Nino Glow, MD  Active   ?LORazepam (ATIVAN) 0.5 MG tablet [id number]  Take 1 tablet (0.5 mg total) by mouth daily as needed for anxiety. McLean-Scocuzza, Nino Glow, MD  Active   ?metFORMIN (GLUCOPHAGE-XR) 500 MG 24 hr tablet [id number]  Take 2 tablets (1,000 mg total) by mouth 2 (two) times daily. McLean-Scocuzza, Nino Glow, MD  Active   ?ondansetron (ZOFRAN) 4 MG tablet [id number] No Take 1 tablet (4 mg total) by mouth 2 (two) times daily as needed.  ?Patient not taking: Reported on 03/03/2022  ? McLean-Scocuzza, Nino Glow, MD Not Taking Active   ?ONE TOUCH ULTRA TEST test strip [id number]   [provider]  Active   ?[facility] DELICA LANCETS 73X MISC [id number]   [provider]  Active   ?Semaglutide, 1 MG/DOSE, 4 MG/3ML SOPN [id number] Yes Inject 1 mg as directed once a week. McLean-Scocuzza, Nino Glow, MD Taking Active   ?Syringe/Needle, Disp, 25G X 1" 10 ML MISC [id number]  1 Device by Does not apply route every 30 (thirty) days. McLean-Scocuzza, Nino Glow, MD  Active   ? ?  ?  ? ?  ? ? ?Assessment/Plan:  ? ?Diabetes: ?- Currently uncontrolled but improved per CGM readings ?- Praised for improvement in glucose. Discussed that we could reduce the Ozempic by ~ 10 clicks (to a dose between 0.5 and 1 mg) and see if better GI tolerability. Patient elects to continue current dose at this time since he has been feeling better. Will consider dose reduction if GI issues continue or worsen ?- Discussed concepts of symptomatic hypoglycemia, given rapid  improvement in glycemic control.  ?- Recommended to continue current regimen at this time ? ?Follow Up Plan: PCP visit in 3 months ? ?Catie Darnelle Maffucci, PharmD, BCACP, CPP ?Clinical Pharmacist ?Therapist, music at [employer] ?[phone] ? ? ? ?

## 2022-03-11 ENCOUNTER — Other Ambulatory Visit: Payer: Self-pay | Admitting: Internal Medicine

## 2022-04-15 ENCOUNTER — Encounter: Payer: Self-pay | Admitting: Internal Medicine

## 2022-04-20 ENCOUNTER — Ambulatory Visit (INDEPENDENT_AMBULATORY_CARE_PROVIDER_SITE_OTHER): Payer: 59 | Admitting: Internal Medicine

## 2022-04-20 ENCOUNTER — Ambulatory Visit
Admission: RE | Admit: 2022-04-20 | Discharge: 2022-04-20 | Disposition: A | Discharge status: Home / Self Care | Payer: 59 | Source: Ambulatory Visit | Attending: Internal Medicine | Admitting: Internal Medicine

## 2022-04-20 ENCOUNTER — Encounter: Payer: Self-pay | Admitting: Internal Medicine

## 2022-04-20 VITALS — BP 130/86 | HR 94 | Temp 99.1°F | Resp 14 | Ht 72.0 in | Wt 208.6 lb

## 2022-04-20 DIAGNOSIS — R1084 Generalized abdominal pain: Secondary | ICD-10-CM | POA: Diagnosis not present

## 2022-04-20 DIAGNOSIS — E119 Type 2 diabetes mellitus without complications: Secondary | ICD-10-CM

## 2022-04-20 DIAGNOSIS — R110 Nausea: Secondary | ICD-10-CM | POA: Insufficient documentation

## 2022-04-20 DIAGNOSIS — E1165 Type 2 diabetes mellitus with hyperglycemia: Secondary | ICD-10-CM

## 2022-04-20 DIAGNOSIS — I152 Hypertension secondary to endocrine disorders: Secondary | ICD-10-CM | POA: Diagnosis not present

## 2022-04-20 DIAGNOSIS — M791 Myalgia, unspecified site: Secondary | ICD-10-CM | POA: Diagnosis not present

## 2022-04-20 DIAGNOSIS — R197 Diarrhea, unspecified: Secondary | ICD-10-CM

## 2022-04-20 DIAGNOSIS — I10 Essential (primary) hypertension: Secondary | ICD-10-CM

## 2022-04-20 DIAGNOSIS — R300 Dysuria: Secondary | ICD-10-CM

## 2022-04-20 DIAGNOSIS — R5383 Other fatigue: Secondary | ICD-10-CM

## 2022-04-20 DIAGNOSIS — E1159 Type 2 diabetes mellitus with other circulatory complications: Secondary | ICD-10-CM

## 2022-04-20 LAB — COMPREHENSIVE METABOLIC PANEL
ALT: 17 U/L (ref 0–53)
AST: 12 U/L (ref 0–37)
Albumin: 4 g/dL (ref 3.5–5.2)
Alkaline Phosphatase: 63 U/L (ref 39–117)
BUN: 11 mg/dL (ref 6–23)
CO2: 26 mEq/L (ref 19–32)
Calcium: 9.3 mg/dL (ref 8.4–10.5)
Chloride: 98 mEq/L (ref 96–112)
Creatinine, Ser: 1.02 mg/dL (ref 0.40–1.50)
GFR: 94.23 mL/min (ref >60.00)
Glucose, Bld: 141 mg/dL — ABNORMAL HIGH (ref 70–99)
Potassium: 4.1 mEq/L (ref 3.5–5.1)
Sodium: 134 mEq/L — ABNORMAL LOW (ref 135–145)
Total Bilirubin: 0.4 mg/dL (ref 0.2–1.2)
Total Protein: 7.1 g/dL (ref 6.0–8.3)

## 2022-04-20 LAB — CBC WITH DIFFERENTIAL/PLATELET
Basophils Absolute: 0.1 10*3/uL (ref 0.0–0.1)
Basophils Relative: 0.4 % (ref 0.0–3.0)
Eosinophils Absolute: 0.1 10*3/uL (ref 0.0–0.7)
Eosinophils Relative: 0.3 % (ref 0.0–5.0)
HCT: 37.8 % — ABNORMAL LOW (ref 39.0–52.0)
Hemoglobin: 12.2 g/dL — ABNORMAL LOW (ref 13.0–17.0)
Lymphocytes Relative: 11.6 % — ABNORMAL LOW (ref 12.0–46.0)
Lymphs Abs: 2 10*3/uL (ref 0.7–4.0)
MCHC: 32.3 g/dL (ref 30.0–36.0)
MCV: 84.1 fl (ref 78.0–100.0)
Monocytes Absolute: 1.3 10*3/uL — ABNORMAL HIGH (ref 0.1–1.0)
Monocytes Relative: 7.7 % (ref 3.0–12.0)
Neutro Abs: 13.6 10*3/uL — ABNORMAL HIGH (ref 1.4–7.7)
Neutrophils Relative %: 80 % — ABNORMAL HIGH (ref 43.0–77.0)
Platelets: 473 10*3/uL — ABNORMAL HIGH (ref 150.0–400.0)
RBC: 4.5 Mil/uL (ref 4.22–5.81)
RDW: 14.7 % (ref 11.5–15.5)
WBC: 17 10*3/uL — ABNORMAL HIGH (ref 4.0–10.5)

## 2022-04-20 LAB — TSH: TSH: 1.34 u[IU]/mL (ref 0.35–5.50)

## 2022-04-20 LAB — URINALYSIS, ROUTINE W REFLEX MICROSCOPIC
Hgb urine dipstick: NEGATIVE
Ketones, ur: NEGATIVE
Leukocytes,Ua: NEGATIVE
Nitrite: NEGATIVE
RBC / HPF: NONE SEEN (ref 0–?)
Specific Gravity, Urine: 1.02 (ref 1.000–1.030)
Total Protein, Urine: NEGATIVE
Urine Glucose: NEGATIVE
Urobilinogen, UA: 0.2 (ref 0.0–1.0)
pH: 6 (ref 5.0–8.0)

## 2022-04-20 LAB — LIPASE: Lipase: 31 U/L (ref 11.0–59.0)

## 2022-04-20 LAB — HEMOGLOBIN A1C: Hgb A1c MFr Bld: 8.4 % — ABNORMAL HIGH (ref 4.6–6.5)

## 2022-04-20 LAB — AMYLASE: Amylase: 33 U/L (ref 27–131)

## 2022-04-20 LAB — CK: Total CK: 42 U/L (ref 7–232)

## 2022-04-20 LAB — MAGNESIUM: Magnesium: 1.7 mg/dL (ref 1.5–2.5)

## 2022-04-20 NOTE — Patient Instructions (Addendum)
Butt paste for babies (zinc, hydrocortisone, vasoline, antifungal cream clotrimazole)  ? ? ? ?

## 2022-04-20 NOTE — Progress Notes (Addendum)
Chief Complaint  ?Patient presents with  ? Acute Visit  ?  Pt arrives to clinic c/o abdominal cramping and nausea while taking ozempic. Has been an ongoing issue for the past 18 wks but has got worse the past 2-3 wks. Was off of work for 4 days and was bed ridden. Cramping is above belly button and below on top of pelvis. Has to use the restroom 8-12 times daily. Had episode of vomiting and diarrhea does not remember when stool was last solid.   ? ?F/u  ?1. Ab cramping with nausea/vomitting/diarrhea ozempic 1 mg weekly not able to tolerate past 2-3 weeks sx worse of out work bedridden due to severe ab cramping belly button and top of pelvis and dysuria and 8-12 x per day diarrhea tried gatorade zero, water and broth, lost 40 lbs at times hot/cold flashes when in the bathroom having diarrhea  ?Also on metformin but has taken this in the past w/o sx's ? ?Review of Systems  ?Constitutional:  Negative for weight loss.  ?HENT:  Negative for hearing loss.   ?Eyes:  Negative for blurred vision.  ?Respiratory:  Negative for shortness of breath.   ?Cardiovascular:  Negative for chest pain.  ?Gastrointestinal:  Positive for abdominal pain, diarrhea, nausea and vomiting. Negative for blood in stool.  ?Musculoskeletal:  Negative for back pain.  ?Skin:  Negative for rash.  ?Neurological:  Negative for headaches.  ?Psychiatric/Behavioral:  Negative for depression.   ?Past Medical History:  ?Diagnosis Date  ? Chicken pox   ? COVID-19   ? 12/2020 sob mild, 05/21/21  ? Depression   ? Diabetes mellitus without complication ([place])   ? History of alcohol abuse   ? Hyperlipidemia   ? Hypertension   ? ?Past Surgical History:  ?Procedure Laterality Date  ? MANDIBLE FRACTURE SURGERY    ? ?Family History  ?Problem Relation Age of Onset  ? Alcohol abuse Mother   ? Hypertension Mother   ? Heart disease Mother   ?     had heart attack 64   ? Diabetes Mother   ?     type 2  ? Heart attack Mother 25  ? Alcohol abuse Father   ? Hypertension Father    ? Diabetes Father   ?     type 1  ? Pancreatitis Father   ? Alcohol abuse Maternal Grandmother   ? Heart disease Maternal Grandmother   ?     died in 104s   ? Hypertension Maternal Grandmother   ? Depression Maternal Grandmother   ? Stroke Paternal Grandfather   ? Heart attack Maternal Uncle   ?     had heart attack   ? ?Social History  ? ?Socioeconomic History  ? Marital status: Married  ?  Spouse name: Not on file  ? Number of children: Not on file  ? Years of education: Not on file  ? Highest education level: Not on file  ?Occupational History  ? Not on file  ?Tobacco Use  ? Smoking status: Former  ? Smokeless tobacco: Former  ?Substance and Sexual Activity  ? Alcohol use: Yes  ?  Alcohol/week: 6.0 standard drinks  ?  Types: 6 Standard drinks or equivalent per week  ? Drug use: Yes  ?  Types: Marijuana  ? Sexual activity: Not on file  ?Other Topics Concern  ? Not on file  ?Social History Narrative  ? Works IT   ? Married   ? Kids daughter [name]   ? ?  Social Determinants of Health  ? ?Financial Resource Strain: Low Risk   ? Difficulty of Paying Living Expenses: Not hard at all  ?Food Insecurity: Not on file  ?Transportation Needs: Not on file  ?Physical Activity: Not on file  ?Stress: Not on file  ?Social Connections: Not on file  ?Intimate Partner Violence: Not on file  ? ?Current Meds  ?Medication Sig  ? atorvastatin (LIPITOR) 40 MG tablet TAKE 1 TABLET DAILY AT NIGHT  ? B-D 3CC LUER-LOK SYR 25GX1" 25G X 1" 3 ML MISC   ? blood glucose meter kit and supplies KIT Dispense based on patient and insurance preference. Use up to four times daily as directed.  ? Cholecalciferol 1.25 MG (50000 UT) capsule Take 1 capsule (50,000 Units total) by mouth once a week. d3  ? Continuous Blood Gluc Sensor (FREESTYLE LIBRE 3 SENSOR) MISC Apply 1 each topically every 14 (fourteen) days. Place 1 sensor on the skin every 14 days. Use to check glucose continuously  ? cyanocobalamin (,VITAMIN B-12,) 1000 MCG/ML injection Inject 1 mL  (1,000 mcg total) into the muscle every 30 (thirty) days.  ? escitalopram (LEXAPRO) 20 MG tablet Take 1 tablet (20 mg total) by mouth daily.  ? lisinopril-hydrochlorothiazide (ZESTORETIC) 20-12.5 MG tablet TAKE 2 TABLETS DAILY IN THE MORNING (INCREASED DOSE, DISCONTINUE PREVIOUS PRESCRIPTION)  ? ONE TOUCH ULTRA TEST test strip   ? ONETOUCH DELICA LANCETS 76H MISC   ? Syringe/Needle, Disp, 25G X 1" 10 ML MISC 1 Device by Does not apply route every 30 (thirty) days.  ? [DISCONTINUED] Semaglutide, 1 MG/DOSE, 4 MG/3ML SOPN Inject 1 mg as directed once a week.  ? ?Allergies  ?Allergen Reactions  ? Mounjaro [Tirzepatide]   ?  Injection site reaction ?  ? Invokana [Canagliflozin] Rash  ?  Reports topical rash on trunk that occurred after taking Invokana for about a week. Resolved after medication discontinuation. No airway involvement.   ? Penicillins Rash  ? ?Recent Results (from the past 2160 hour(s))  ?CBC w/Diff     Status: Abnormal  ? Collection Time: 02/01/22  8:57 AM  ?Result Value Ref Range  ? WBC 11.2 (H) 4.0 - 10.5 K/uL  ? RBC 4.89 4.22 - 5.81 Mil/uL  ? Hemoglobin 13.4 13.0 - 17.0 g/dL  ? HCT 41.5 39.0 - 52.0 %  ? MCV 84.8 78.0 - 100.0 fl  ? MCHC 32.2 30.0 - 36.0 g/dL  ? RDW 12.9 11.5 - 15.5 %  ? Platelets 277.0 150.0 - 400.0 K/uL  ? Neutrophils Relative % 67.8 43.0 - 77.0 %  ? Lymphocytes Relative 25.4 12.0 - 46.0 %  ? Monocytes Relative 5.6 3.0 - 12.0 %  ? Eosinophils Relative 0.7 0.0 - 5.0 %  ? Basophils Relative 0.5 0.0 - 3.0 %  ? Neutro Abs 7.6 1.4 - 7.7 K/uL  ? Lymphs Abs 2.8 0.7 - 4.0 K/uL  ? Monocytes Absolute 0.6 0.1 - 1.0 K/uL  ? Eosinophils Absolute 0.1 0.0 - 0.7 K/uL  ? Basophils Absolute 0.1 0.0 - 0.1 K/uL  ?B12     Status: None  ? Collection Time: 02/01/22  8:57 AM  ?Result Value Ref Range  ? Vitamin B-12 336 211 - 911 pg/mL  ?Direct LDL     Status: None  ? Collection Time: 02/01/22  8:57 AM  ?Result Value Ref Range  ? Direct LDL 71.0 mg/dL  ?  Comment: Optimal:  <100 mg/dLNear or Above Optimal:   100-129 mg/dLBorderline High:  130-159 mg/dLHigh:  160-189  mg/dLVery High:  >190 mg/dL  ?Lipid Profile     Status: Abnormal  ? Collection Time: 02/01/22  8:57 AM  ?Result Value Ref Range  ? Cholesterol 138 0 - 200 mg/dL  ?  Comment: ATP III Classification       Desirable:  < 200 mg/dL               Borderline High:  200 - 239 mg/dL          High:  > = 240 mg/dL  ? Triglycerides 337.0 (H) 0.0 - 149.0 mg/dL  ?  Comment: Normal:  <150 mg/dLBorderline High:  150 - 199 mg/dL  ? HDL 31.90 (L) >39.00 mg/dL  ? VLDL 67.4 (H) 0.0 - 40.0 mg/dL  ? Total CHOL/HDL Ratio 4   ?  Comment:                Men          Women1/2 Average Risk     3.4          3.3Average Risk          5.0          4.42X Average Risk          9.6          7.13X Average Risk          15.0          11.0                      ? NonHDL 106.39   ?  Comment: NOTE:  Non-HDL goal should be 30 mg/dL higher than patient's LDL goal (i.e. LDL goal of < 70 mg/dL, would have non-HDL goal of < 100 mg/dL)  ?Vitamin D (25 hydroxy)     Status: Abnormal  ? Collection Time: 02/01/22  8:57 AM  ?Result Value Ref Range  ? VITD 22.69 (L) 30.00 - 100.00 ng/mL  ?Comp Met (CMET)     Status: Abnormal  ? Collection Time: 02/01/22  8:57 AM  ?Result Value Ref Range  ? Sodium 136 135 - 145 mEq/L  ? Potassium 4.6 3.5 - 5.1 mEq/L  ? Chloride 99 96 - 112 mEq/L  ? CO2 27 19 - 32 mEq/L  ? Glucose, Bld 182 (H) 70 - 99 mg/dL  ? BUN 16 6 - 23 mg/dL  ? Creatinine, Ser 1.02 0.40 - 1.50 mg/dL  ? Total Bilirubin 0.4 0.2 - 1.2 mg/dL  ? Alkaline Phosphatase 81 39 - 117 U/L  ? AST 37 0 - 37 U/L  ? ALT 63 (H) 0 - 53 U/L  ? Total Protein 7.2 6.0 - 8.3 g/dL  ? Albumin 4.5 3.5 - 5.2 g/dL  ? GFR 94.38 >60.00 mL/min  ?  Comment: Calculated using the CKD-EPI Creatinine Equation (2021)  ? Calcium 9.6 8.4 - 10.5 mg/dL  ?HgB A1c     Status: Abnormal  ? Collection Time: 02/01/22  8:57 AM  ?Result Value Ref Range  ? Hgb A1c MFr Bld 12.1 (H) 4.6 - 6.5 %  ?  Comment: Glycemic Control Guidelines for People with  Diabetes:Non Diabetic:  <6%Goal of Therapy: <7%Additional Action Suggested:  >8%   ? ?Objective  ?Body mass index is 28.29 kg/m?. ?Wt Readings from Last 3 Encounters:  ?04/20/22 208 lb 9.6 oz (94.6 kg)  ?02/09/

## 2022-04-21 LAB — URINE CULTURE
MICRO NUMBER:: 13309293
Result:: NO GROWTH
SPECIMEN QUALITY:: ADEQUATE

## 2022-04-23 ENCOUNTER — Other Ambulatory Visit: Payer: Self-pay | Admitting: Internal Medicine

## 2022-04-23 DIAGNOSIS — F419 Anxiety disorder, unspecified: Secondary | ICD-10-CM

## 2022-04-25 ENCOUNTER — Telehealth: Payer: Self-pay | Admitting: Internal Medicine

## 2022-04-25 LAB — GLUTAMIC ACID DECARBOXYLASE AUTO ABS: Glutamic Acid Decarb Ab: <5 IU/mL (ref <5)

## 2022-04-25 MED ORDER — BD PEN NEEDLE SHORT U/F 31G X 8 MM MISC: 1.0000 | Freq: Every day | 3 refills | Status: DC

## 2022-04-25 MED ORDER — INSULIN DEGLUDEC 100 UNIT/ML ~~LOC~~ SOPN: 5.0000 [IU] | PEN_INJECTOR | Freq: Every day | SUBCUTANEOUS | 1 refills | Status: DC

## 2022-04-25 NOTE — Telephone Encounter (Signed)
Call pt for sample of insulin if we have tresiba see instructions of mail order tresiba and pt will need nurse visit to instruct how to give himself insulin please  ? ? ?

## 2022-04-25 NOTE — Addendum Note (Signed)
Addended by: Orland Mustard on: 04/25/2022 08:32 PM ? ? Modules accepted: Orders ? ?

## 2022-04-26 NOTE — Telephone Encounter (Signed)
Pt has been scheduled for f/u appt on lab results and discuss about starting insulin on 04/27/22 @2 :40  ?

## 2022-04-27 ENCOUNTER — Encounter: Payer: Self-pay | Admitting: Internal Medicine

## 2022-04-27 ENCOUNTER — Ambulatory Visit (INDEPENDENT_AMBULATORY_CARE_PROVIDER_SITE_OTHER): Payer: 59 | Admitting: Internal Medicine

## 2022-04-27 VITALS — BP 124/84 | HR 110 | Resp 12 | Ht 72.0 in | Wt 208.6 lb

## 2022-04-27 DIAGNOSIS — I700 Atherosclerosis of aorta: Secondary | ICD-10-CM | POA: Diagnosis not present

## 2022-04-27 DIAGNOSIS — E1165 Type 2 diabetes mellitus with hyperglycemia: Secondary | ICD-10-CM

## 2022-04-27 DIAGNOSIS — E119 Type 2 diabetes mellitus without complications: Secondary | ICD-10-CM | POA: Diagnosis not present

## 2022-04-27 DIAGNOSIS — R109 Unspecified abdominal pain: Secondary | ICD-10-CM | POA: Diagnosis not present

## 2022-04-27 DIAGNOSIS — K760 Fatty (change of) liver, not elsewhere classified: Secondary | ICD-10-CM

## 2022-04-27 DIAGNOSIS — R197 Diarrhea, unspecified: Secondary | ICD-10-CM

## 2022-04-27 DIAGNOSIS — K5790 Diverticulosis of intestine, part unspecified, without perforation or abscess without bleeding: Secondary | ICD-10-CM

## 2022-04-27 MED ORDER — INSULIN DEGLUDEC 100 UNIT/ML ~~LOC~~ SOPN: 5.0000 [IU] | PEN_INJECTOR | Freq: Every day | SUBCUTANEOUS | 1 refills | Status: DC

## 2022-04-27 NOTE — Patient Instructions (Addendum)
Tdap due 11/28/22  ? ?My chart me name of wife nutritionist  ? ?Dr. Elvera Lennox diabetes doctor call for appt  ? ?Phone Fax E-mail Address  ?[phone] Not available [address]  ? [address]  ? [place] [address]  ?   ?Specialties     ? ?Goal sugar fasting in the am 90 to <140  ?Do 5 units lunch if your blood sugar next day is not at goal increase daily by 1 unit to max 10 units  ?And let me know  ?2 hours after a meal sugar should be <180  ? ? ?GI doctor will call you for appt  ? ?Phone Fax E-mail Address  ?[phone] Not available [address]  ? [place] [address]  ?   ? ?Specialties     ?Gastroenterology     ? ? ?Insulin Degludec Injection ?What is this medication? ?INSULIN DEGLUDEC (IN su lin de GLOO dek) treats diabetes. It works by increasing insulin levels in your body, which decreases your blood sugar (glucose). It belongs to a group of medications called long-acting insulins. Changes to diet and exercise are often combined with this medication. ?This medicine may be used for other purposes; ask your health care provider or pharmacist if you have questions. ?COMMON BRAND NAME(S): Evaristo Bury ?What should I tell my care team before I take this medication? ?They need to know if you have any of these conditions: ?Episodes of low blood sugar ?Eye disease, vision problems ?Kidney disease ?Liver disease ?An unusual or allergic reaction to insulin, other medications, foods, dyes, or preservatives ?Pregnant or trying to get pregnant ?Breast-feeding ?How should I use this medication? ?This medication is for injection under the skin. Use exactly as directed. This insulin should never be mixed in the same syringe with other insulins before injection. Do not vigorously shake before use. You will be taught how to use this medication and how to adjust doses for activities and illness. Do not use more insulin than prescribed. ?Always check the appearance of your insulin before  using it. This medication should be clear and colorless like water. Do not use it if it is cloudy, thickened, colored, or has solid particles in it. ?If you use an insulin pen, be sure to take off the outer needle cover before using the dose. ?It is important that you put your used needles and syringes in a special sharps container. Do not put them in a trash can. If you do not have a sharps container, call your pharmacist or care team to get one. ?This medication comes with INSTRUCTIONS FOR USE. Ask your pharmacist for directions on how to use this medication. Read the information carefully. Talk to your pharmacist or care team if you have questions. ?Talk to your care team regarding the use of this medication in children. While this medication may be prescribed for children as young as 1 year for selected conditions, precautions do apply. ?Overdosage: If you think you have taken too much of this medicine contact a poison control center or emergency room at once. ?NOTE: This medicine is only for you. Do not share this medicine with others. ?What if I miss a dose? ?For adults: If you miss a dose, take it as soon as you can. Make sure your next dose is taken at least 8 hours later. Do not take double or extra doses. ?For adolescents and children: It is important not to miss a dose. Your care team should discuss a  plan for missed doses with you. If you do miss a dose, follow their plan. Do not take double doses. ?What may interact with this medication? ?Alcohol containing beverages ?Antiviral medications for HIV or AIDS ?Aspirin and aspirin-like medications ?Beta-blockers like atenolol, metoprolol, propranolol ?Certain medications for blood pressure, heart disease, irregular heart beat ?Chromium ?Clonidine ?Diuretics ?Male hormones, such as estrogens or progestins, birth control pills ?Fenofibrate ?Gemfibrozil ?Guanethidine ?Isoniazid ?Lanreotide ?Male hormones or anabolic steroids ?MAOIs like Carbex, Eldepryl,  Marplan, Nardil, and Parnate ?Medications for weight loss ?Medications for allergies, asthma, cold, or cough ?Medications for depression, anxiety, or psychotic disturbances ?Niacin ?Nicotine ?NSAIDs, medications for pain and inflammation, like ibuprofen or naproxen ?Octreotide ?Other medications for diabetes, like glyburide, glipizide, or glimepiride ?Pasireotide ?Pentamidine ?Phenytoin ?Probenecid ?Quinolone antibiotics such as ciprofloxacin, levofloxacin, ofloxacin ?Reserpine ?Some herbal dietary supplements ?Steroid medications such as prednisone or cortisone ?Sulfamethoxazole; trimethoprim ?Thyroid hormones ?This list may not describe all possible interactions. Give your health care provider a list of all the medicines, herbs, non-prescription drugs, or dietary supplements you use. Also tell them if you smoke, drink alcohol, or use illegal drugs. Some items may interact with your medicine. ?What should I watch for while using this medication? ?Visit your care team for regular checks on your progress. ?A test called the HbA1C (A1C) will be monitored. This is a simple blood test. It measures your blood sugar control over the last 2 to 3 months. You will receive this test every 3 to 6 months. ?Learn how to check your blood sugar. Learn the symptoms of low and high blood sugar and how to manage them. ?Always carry a quick-source of sugar with you in case you have symptoms of low blood sugar. Examples include hard sugar candy or glucose tablets. Make sure others know that you can choke if you eat or drink when you develop serious symptoms of low blood sugar, such as seizures or unconsciousness. They must get medical help at once. ?Tell your care team if you have high blood sugar. You might need to change the dose of your medication. If you are sick or exercising more than usual, you might need to change the dose of your medication. ?Do not skip meals. Ask your care team if you should avoid alcohol. Many  nonprescription cough and cold products contain sugar or alcohol. These can affect blood sugar. ?Make sure that you have the right kind of syringe for the type of insulin you use. Try not to change the brand and type of insulin or syringe unless your care team tells you to. Switching insulin brand or type can cause dangerously high or low blood sugar. Always keep an extra supply of insulin, syringes, and needles on hand. Use a syringe one time only. Throw away syringe and needle in a closed container to prevent accidental needle sticks. ?Insulin pens and cartridges should never be shared. Even if the needle is changed, sharing may result in passing of viruses like hepatitis or HIV. ?Each time you get a new box of pen needles, check to see if they are the same type as the ones you were trained to use. If not, ask your care team to show you how to use this new type properly. ?Wear a medical ID bracelet or chain, and carry a card that describes your disease and details of your medication and dosage times. ?What side effects may I notice from receiving this medication? ?Side effects that you should report to your care team as soon as possible: ?  Allergic reactions--skin rash, itching, hives, swelling of the face, lips, tongue, or throat ?Low blood sugar (hypoglycemia)--tremors or shaking, anxiety, sweating, cold or clammy skin, confusion, dizziness, rapid heartbeat ?Low potassium level--muscle pain or cramps, unusual weakness or fatigue, fast or irregular heartbeat, constipation ?Side effects that usually do not require medical attention (report to your care team if they continue or are bothersome): ?Lipodystrophy--hardening or scarring of tissue at injection site ?Pain, redness, or irritation at injection site ?Weight gain ?This list may not describe all possible side effects. Call your doctor for medical advice about side effects. You may report side effects to FDA at 1-800-FDA-1088. ?Where should I keep my  medication? ?Keep out of the reach of children and pets. ?Unopened Vials: ?Tresiba vials: Store in a refrigerator between 2 and 8 degrees C (36 and 46 degrees F) or at room temperature below 30 degrees C (86 degrees F). Do not

## 2022-04-27 NOTE — Progress Notes (Addendum)
Chief Complaint  ?Patient presents with  ? Follow-up  ?  Discuss labs, disc insulin. Pt has freestyle libre 3. Glucose currently at 173. Chronic pain improved still going to bathroom 6-10 times daily.  ? ?F/u  ?1. FH Dm 2 (has freestyle libre) in dad in his 60s Dm 2 A1c 8.4 improved from >12 he has been off ozempic 1 mg weekly x > 1 week still having diarrhea at times dark brown with blood 6-10 x per day tried probiotics x 4 days and peptobismol/imodium no recent Abx use he is still having ab cramping  ?He is agreeable to insulin did not tolerate trulicity or ozempic in the past  ?2. Review of ct scan + fatty liver with elevated lfts, diverticulosis ? ? ?Review of Systems  ?Constitutional:  Negative for weight loss.  ?HENT:  Negative for hearing loss.   ?Eyes:  Negative for blurred vision.  ?Respiratory:  Negative for shortness of breath.   ?Cardiovascular:  Negative for chest pain.  ?Gastrointestinal:  Positive for abdominal pain, blood in stool and diarrhea.  ?Musculoskeletal:  Negative for back pain.  ?Skin:  Negative for rash.  ?Neurological:  Negative for headaches.  ?Psychiatric/Behavioral:  Negative for depression.   ?Past Medical History:  ?Diagnosis Date  ? Chicken pox   ? COVID-19   ? 12/2020 sob mild, 05/21/21  ? Depression   ? Diabetes mellitus without complication ([place])   ? History of alcohol abuse   ? Hyperlipidemia   ? Hypertension   ? ?Past Surgical History:  ?Procedure Laterality Date  ? MANDIBLE FRACTURE SURGERY    ? ?Family History  ?Problem Relation Age of Onset  ? Alcohol abuse Mother   ? Hypertension Mother   ? Heart disease Mother   ?     had heart attack 58   ? Diabetes Mother   ?     type 2  ? Heart attack Mother 90  ? Alcohol abuse Father   ? Hypertension Father   ? Diabetes Father   ?     type 1  ? Pancreatitis Father   ? Alcohol abuse Maternal Grandmother   ? Heart disease Maternal Grandmother   ?     died in 28s   ? Hypertension Maternal Grandmother   ? Depression Maternal Grandmother   ?  Stroke Paternal Grandfather   ? Heart attack Maternal Uncle   ?     had heart attack   ? ?Social History  ? ?Socioeconomic History  ? Marital status: Married  ?  Spouse name: Not on file  ? Number of children: Not on file  ? Years of education: Not on file  ? Highest education level: Not on file  ?Occupational History  ? Not on file  ?Tobacco Use  ? Smoking status: Former  ? Smokeless tobacco: Former  ?Substance and Sexual Activity  ? Alcohol use: Yes  ?  Alcohol/week: 6.0 standard drinks  ?  Types: 6 Standard drinks or equivalent per week  ? Drug use: Yes  ?  Types: Marijuana  ? Sexual activity: Not on file  ?Other Topics Concern  ? Not on file  ?Social History Narrative  ? Works IT   ? Married   ? Kids daughter [name]   ? ?Social Determinants of Health  ? ?Financial Resource Strain: Low Risk   ? Difficulty of Paying Living Expenses: Not hard at all  ?Food Insecurity: Not on file  ?Transportation Needs: Not on file  ?Physical Activity: Not  on file  ?Stress: Not on file  ?Social Connections: Not on file  ?Intimate Partner Violence: Not on file  ? ?Current Meds  ?Medication Sig  ? amLODipine (NORVASC) 2.5 MG tablet Take 1 tablet (2.5 mg total) by mouth daily.  ? atorvastatin (LIPITOR) 40 MG tablet TAKE 1 TABLET DAILY AT NIGHT  ? B-D 3CC LUER-LOK SYR 25GX1" 25G X 1" 3 ML MISC   ? blood glucose meter kit and supplies KIT Dispense based on patient and insurance preference. Use up to four times daily as directed.  ? Cholecalciferol 1.25 MG (50000 UT) capsule Take 1 capsule (50,000 Units total) by mouth once a week. d3  ? Continuous Blood Gluc Sensor (FREESTYLE LIBRE 3 SENSOR) MISC Apply 1 each topically every 14 (fourteen) days. Place 1 sensor on the skin every 14 days. Use to check glucose continuously  ? cyanocobalamin (,VITAMIN B-12,) 1000 MCG/ML injection Inject 1 mL (1,000 mcg total) into the muscle every 30 (thirty) days.  ? escitalopram (LEXAPRO) 20 MG tablet TAKE 1 TABLET DAILY  ? Insulin Pen Needle (B-D  ULTRAFINE III SHORT PEN) 31G X 8 MM MISC 1 Device by Does not apply route daily.  ? lisinopril-hydrochlorothiazide (ZESTORETIC) 20-12.5 MG tablet TAKE 2 TABLETS DAILY IN THE MORNING (INCREASED DOSE, DISCONTINUE PREVIOUS PRESCRIPTION)  ? ONE TOUCH ULTRA TEST test strip   ? ONETOUCH DELICA LANCETS 53M MISC   ? Syringe/Needle, Disp, 25G X 1" 10 ML MISC 1 Device by Does not apply route every 30 (thirty) days.  ? [DISCONTINUED] insulin degludec (TRESIBA) 100 UNIT/ML FlexTouch Pen Inject 5 Units into the skin daily. Increase by 1 unit each day until am blood sugar 90 to <140. Stop at 10 units max daily with food  ? ?Allergies  ?Allergen Reactions  ? Mounjaro [Tirzepatide]   ?  Injection site reaction ?  ? Ozempic (0.25 Or 0.5 Mg-Dose) [Semaglutide(0.25 Or 0.16m-Dos)]   ?  N/v/diarrhea/ab pain   ? Trulicity [Dulaglutide]   ?  Gi Sxs  ? Invokana [Canagliflozin] Rash  ?  Reports topical rash on trunk that occurred after taking Invokana for about a week. Resolved after medication discontinuation. No airway involvement.   ? Penicillins Rash  ? ?Recent Results (from the past 2160 hour(s))  ?CBC w/Diff     Status: Abnormal  ? Collection Time: 02/01/22  8:57 AM  ?Result Value Ref Range  ? WBC 11.2 (H) 4.0 - 10.5 K/uL  ? RBC 4.89 4.22 - 5.81 Mil/uL  ? Hemoglobin 13.4 13.0 - 17.0 g/dL  ? HCT 41.5 39.0 - 52.0 %  ? MCV 84.8 78.0 - 100.0 fl  ? MCHC 32.2 30.0 - 36.0 g/dL  ? RDW 12.9 11.5 - 15.5 %  ? Platelets 277.0 150.0 - 400.0 K/uL  ? Neutrophils Relative % 67.8 43.0 - 77.0 %  ? Lymphocytes Relative 25.4 12.0 - 46.0 %  ? Monocytes Relative 5.6 3.0 - 12.0 %  ? Eosinophils Relative 0.7 0.0 - 5.0 %  ? Basophils Relative 0.5 0.0 - 3.0 %  ? Neutro Abs 7.6 1.4 - 7.7 K/uL  ? Lymphs Abs 2.8 0.7 - 4.0 K/uL  ? Monocytes Absolute 0.6 0.1 - 1.0 K/uL  ? Eosinophils Absolute 0.1 0.0 - 0.7 K/uL  ? Basophils Absolute 0.1 0.0 - 0.1 K/uL  ?B12     Status: None  ? Collection Time: 02/01/22  8:57 AM  ?Result Value Ref Range  ? Vitamin B-12 336 211 - 911  pg/mL  ?Direct LDL  Status: None  ? Collection Time: 02/01/22  8:57 AM  ?Result Value Ref Range  ? Direct LDL 71.0 mg/dL  ?  Comment: Optimal:  <100 mg/dLNear or Above Optimal:  100-129 mg/dLBorderline High:  130-159 mg/dLHigh:  160-189 mg/dLVery High:  >190 mg/dL  ?Lipid Profile     Status: Abnormal  ? Collection Time: 02/01/22  8:57 AM  ?Result Value Ref Range  ? Cholesterol 138 0 - 200 mg/dL  ?  Comment: ATP III Classification       Desirable:  < 200 mg/dL               Borderline High:  200 - 239 mg/dL          High:  > = 240 mg/dL  ? Triglycerides 337.0 (H) 0.0 - 149.0 mg/dL  ?  Comment: Normal:  <150 mg/dLBorderline High:  150 - 199 mg/dL  ? HDL 31.90 (L) >39.00 mg/dL  ? VLDL 67.4 (H) 0.0 - 40.0 mg/dL  ? Total CHOL/HDL Ratio 4   ?  Comment:                Men          Women1/2 Average Risk     3.4          3.3Average Risk          5.0          4.42X Average Risk          9.6          7.13X Average Risk          15.0          11.0                      ? NonHDL 106.39   ?  Comment: NOTE:  Non-HDL goal should be 30 mg/dL higher than patient's LDL goal (i.e. LDL goal of < 70 mg/dL, would have non-HDL goal of < 100 mg/dL)  ?Vitamin D (25 hydroxy)     Status: Abnormal  ? Collection Time: 02/01/22  8:57 AM  ?Result Value Ref Range  ? VITD 22.69 (L) 30.00 - 100.00 ng/mL  ?Comp Met (CMET)     Status: Abnormal  ? Collection Time: 02/01/22  8:57 AM  ?Result Value Ref Range  ? Sodium 136 135 - 145 mEq/L  ? Potassium 4.6 3.5 - 5.1 mEq/L  ? Chloride 99 96 - 112 mEq/L  ? CO2 27 19 - 32 mEq/L  ? Glucose, Bld 182 (H) 70 - 99 mg/dL  ? BUN 16 6 - 23 mg/dL  ? Creatinine, Ser 1.02 0.40 - 1.50 mg/dL  ? Total Bilirubin 0.4 0.2 - 1.2 mg/dL  ? Alkaline Phosphatase 81 39 - 117 U/L  ? AST 37 0 - 37 U/L  ? ALT 63 (H) 0 - 53 U/L  ? Total Protein 7.2 6.0 - 8.3 g/dL  ? Albumin 4.5 3.5 - 5.2 g/dL  ? GFR 94.38 >60.00 mL/min  ?  Comment: Calculated using the CKD-EPI Creatinine Equation (2021)  ? Calcium 9.6 8.4 - 10.5 mg/dL  ?HgB A1c      Status: Abnormal  ? Collection Time: 02/01/22  8:57 AM  ?Result Value Ref Range  ? Hgb A1c MFr Bld 12.1 (H) 4.6 - 6.5 %  ?  Comment: Glycemic Control Guidelines for People with Diabetes:Non Diabetic:  <6%Goal

## 2022-04-29 ENCOUNTER — Other Ambulatory Visit
Admission: RE | Admit: 2022-04-29 | Discharge: 2022-04-29 | Disposition: A | Discharge status: Home / Self Care | Payer: 59 | Source: Ambulatory Visit | Attending: Internal Medicine | Admitting: Internal Medicine

## 2022-04-29 DIAGNOSIS — R197 Diarrhea, unspecified: Secondary | ICD-10-CM | POA: Diagnosis not present

## 2022-04-29 LAB — C DIFFICILE QUICK SCREEN W PCR REFLEX
C Diff antigen: NEGATIVE
C Diff interpretation: NOT DETECTED
C Diff toxin: NEGATIVE

## 2022-05-03 ENCOUNTER — Other Ambulatory Visit: Payer: 59

## 2022-05-05 ENCOUNTER — Ambulatory Visit
Admission: RE | Admit: 2022-05-05 | Discharge: 2022-05-05 | Disposition: A | Discharge status: Home / Self Care | Payer: 59 | Source: Ambulatory Visit | Attending: Gastroenterology | Admitting: Gastroenterology

## 2022-05-05 ENCOUNTER — Ambulatory Visit
Admission: RE | Admit: 2022-05-05 | Discharge: 2022-05-05 | Disposition: A | Discharge status: Home / Self Care | Payer: 59 | Attending: Gastroenterology | Admitting: Gastroenterology

## 2022-05-05 ENCOUNTER — Other Ambulatory Visit: Payer: Self-pay | Admitting: Gastroenterology

## 2022-05-05 DIAGNOSIS — R1084 Generalized abdominal pain: Secondary | ICD-10-CM | POA: Diagnosis present

## 2022-05-05 DIAGNOSIS — K561 Intussusception: Secondary | ICD-10-CM | POA: Diagnosis present

## 2022-05-21 ENCOUNTER — Other Ambulatory Visit: Payer: Self-pay | Admitting: Internal Medicine

## 2022-05-21 DIAGNOSIS — F419 Anxiety disorder, unspecified: Secondary | ICD-10-CM

## 2022-06-04 ENCOUNTER — Ambulatory Visit: Payer: 59 | Admitting: Internal Medicine

## 2022-06-09 ENCOUNTER — Telehealth: Payer: Self-pay | Admitting: Internal Medicine

## 2022-06-09 NOTE — Telephone Encounter (Signed)
[employer] works with your patients' plan sponsors to provide you with the enclosed [employer] safety and health considerations for patients in your practice. Please review the health information provided and make any changes in therapy that you believe are appropriate. [employer] understands that the information may not be applicable to every patient's therapy and therefore presents it as informational only.  Patient Adherence: Antihypertensive Medication Your patient has demonstrated a pattern of low adherence to antihypertensive medication based on claims records. Consider reviewing the treatment plan with your patient. Medication nonadherence is associated with higher rates of hospital admissions, suboptimal health outcomes and increased morbidity and mortality.

## 2022-06-30 ENCOUNTER — Encounter: Payer: Self-pay | Admitting: Internal Medicine

## 2022-06-30 ENCOUNTER — Telehealth: Payer: Self-pay | Admitting: Internal Medicine

## 2022-06-30 NOTE — Telephone Encounter (Signed)
Do you all know what is going on with scheduling pts f/u colonoscopy, abdominal pain and diarrhea?   His message below? Good Morning,   I'm unsure how to start this email, but I'm not feeling well and haven't felt well for some time now. I'm losing hope and patience at this point. The intestinal cramping and extreme discomfort have dampened my quality of life for six months to a year.   I spent the entire 4th of July weekend in bed and multiple days last week. It's affecting my life in every way imaginable at this point. My wife is doing everything around the house. I'm missing multiple days of work and feel very discouraged.   At this point, I see two options to get relief. I need medicine that curbs the pain while we undergo more testing, or I need to be admitted to the hospital.   If we go the route where I obtain pain relief for more testing, we will need to reschedule the colonoscopy, as there was almost zero communication from that group, and I didn't hear from them until the day of. No instruction on how to use the solution, what was expected, how to prepare, etc...   If you'd like to meet/see me, I will clear or move my schedule in any way to accommodate. I simply can't take it any longer.   Regards,   Jorge Wright

## 2022-07-01 ENCOUNTER — Other Ambulatory Visit: Payer: Self-pay | Admitting: Internal Medicine

## 2022-07-01 DIAGNOSIS — R109 Unspecified abdominal pain: Secondary | ICD-10-CM

## 2022-07-01 MED ORDER — DICYCLOMINE HCL 20 MG PO TABS: 20.0000 mg | ORAL_TABLET | Freq: Three times a day (TID) | ORAL | 3 refills | Status: DC

## 2022-07-07 ENCOUNTER — Emergency Department: Payer: 59

## 2022-07-07 ENCOUNTER — Emergency Department
Admission: EM | Admit: 2022-07-07 | Discharge: 2022-07-07 | Disposition: A | Discharge status: Home / Self Care | Payer: 59 | Attending: Emergency Medicine | Admitting: Emergency Medicine

## 2022-07-07 ENCOUNTER — Other Ambulatory Visit: Payer: Self-pay

## 2022-07-07 DIAGNOSIS — D72829 Elevated white blood cell count, unspecified: Secondary | ICD-10-CM | POA: Insufficient documentation

## 2022-07-07 DIAGNOSIS — R109 Unspecified abdominal pain: Secondary | ICD-10-CM | POA: Diagnosis present

## 2022-07-07 DIAGNOSIS — K5730 Diverticulosis of large intestine without perforation or abscess without bleeding: Secondary | ICD-10-CM | POA: Diagnosis not present

## 2022-07-07 LAB — SAMPLE TO BLOOD BANK

## 2022-07-07 LAB — COMPREHENSIVE METABOLIC PANEL
ALT: 21 U/L (ref 0–44)
AST: 14 U/L — ABNORMAL LOW (ref 15–41)
Albumin: 3.3 g/dL — ABNORMAL LOW (ref 3.5–5.0)
Alkaline Phosphatase: 57 U/L (ref 38–126)
Anion gap: 10 (ref 5–15)
BUN: 11 mg/dL (ref 6–20)
CO2: 23 mmol/L (ref 22–32)
Calcium: 9.1 mg/dL (ref 8.9–10.3)
Chloride: 103 mmol/L (ref 98–111)
Creatinine, Ser: 1.06 mg/dL (ref 0.61–1.24)
GFR, Estimated: >60 mL/min (ref >60)
Glucose, Bld: 196 mg/dL — ABNORMAL HIGH (ref 70–99)
Potassium: 4.1 mmol/L (ref 3.5–5.1)
Sodium: 136 mmol/L (ref 135–145)
Total Bilirubin: 0.7 mg/dL (ref 0.3–1.2)
Total Protein: 8.2 g/dL — ABNORMAL HIGH (ref 6.5–8.1)

## 2022-07-07 LAB — C DIFFICILE QUICK SCREEN W PCR REFLEX
C Diff antigen: NEGATIVE
C Diff interpretation: NOT DETECTED
C Diff toxin: NEGATIVE

## 2022-07-07 LAB — GASTROINTESTINAL PANEL BY PCR, STOOL (REPLACES STOOL CULTURE)

## 2022-07-07 LAB — URINALYSIS, ROUTINE W REFLEX MICROSCOPIC
Bilirubin Urine: NEGATIVE
Glucose, UA: 50 mg/dL — AB
Hgb urine dipstick: NEGATIVE
Ketones, ur: 20 mg/dL — AB
Leukocytes,Ua: NEGATIVE
Nitrite: NEGATIVE
Protein, ur: NEGATIVE mg/dL
Specific Gravity, Urine: >1.046 — ABNORMAL HIGH (ref 1.005–1.030)
pH: 5 (ref 5.0–8.0)

## 2022-07-07 LAB — CBC
HCT: 37.6 % — ABNORMAL LOW (ref 39.0–52.0)
Hemoglobin: 11.6 g/dL — ABNORMAL LOW (ref 13.0–17.0)
MCH: 25.7 pg — ABNORMAL LOW (ref 26.0–34.0)
MCHC: 30.9 g/dL (ref 30.0–36.0)
MCV: 83.4 fL (ref 80.0–100.0)
Platelets: 565 10*3/uL — ABNORMAL HIGH (ref 150–400)
RBC: 4.51 MIL/uL (ref 4.22–5.81)
RDW: 14.9 % (ref 11.5–15.5)
WBC: 15.6 10*3/uL — ABNORMAL HIGH (ref 4.0–10.5)
nRBC: 0 % (ref 0.0–0.2)

## 2022-07-07 LAB — LIPASE, BLOOD: Lipase: 31 U/L (ref 11–51)

## 2022-07-07 MED ORDER — HALOPERIDOL 2 MG PO TABS: 2.0000 mg | ORAL_TABLET | Freq: Two times a day (BID) | ORAL | 0 refills | Status: DC | PRN

## 2022-07-07 MED ORDER — HALOPERIDOL LACTATE 5 MG/ML IJ SOLN
5.0000 mg | Freq: Once | INTRAMUSCULAR | Status: AC
  Administered 2022-07-07: 5 mg via INTRAVENOUS
  Filled 2022-07-07: qty 1

## 2022-07-07 MED ORDER — HYDROCORTISONE ACETATE 25 MG RE SUPP: 25.0000 mg | Freq: Two times a day (BID) | RECTAL | 1 refills | Status: DC

## 2022-07-07 MED ORDER — IOHEXOL 300 MG/ML  SOLN
100.0000 mL | Freq: Once | INTRAMUSCULAR | Status: AC | PRN
  Administered 2022-07-07: 100 mL via INTRAVENOUS

## 2022-07-07 MED ORDER — SODIUM CHLORIDE 0.9 % IV BOLUS
1000.0000 mL | Freq: Once | INTRAVENOUS | Status: AC
  Administered 2022-07-07: 1000 mL via INTRAVENOUS

## 2022-07-07 NOTE — Discharge Instructions (Signed)
Please seek medical attention for any high fevers, chest pain, shortness of breath, change in behavior, persistent vomiting, bloody stool or any other new or concerning symptoms.  

## 2022-07-07 NOTE — ED Provider Notes (Signed)
[facility] Provider Note    Event Date/Time   First MD Initiated Contact with Patient 07/07/22 [phone]     (approximate)   History   Abdominal Pain   HPI  Jorge Wright is a 36 y.o. male who presents to the emergency department today with continued abdominal pain and diarrhea.  Patient states his symptoms have been going on for months.  Pain is throughout his abdomen however worse on the left-hand side.  This has been accompanied by decreased oral intake and nausea.  Patient has been prescribed Bentyl for this without any significant relief.  He has followed up with his doctor and has a colonoscopy scheduled for next month.      Physical Exam   Triage Vital Signs: ED Triage Vitals  Enc Vitals Group     BP 07/07/22 0850 (!) 143/105     Pulse Rate 07/07/22 0850 99     Resp 07/07/22 0850 18     Temp 07/07/22 0850 98.1 F (36.7 C)     Temp Source 07/07/22 0850 Oral     SpO2 07/07/22 0850 99 %     Weight 07/07/22 0857 208 lb (94.3 kg)     Height 07/07/22 0857 6' (1.829 m)     Head Circumference --      Peak Flow --      Pain Score 07/07/22 0857 4     Pain Loc --      Pain Edu? --      Excl. in GC? --     Most recent vital signs: Vitals:   07/07/22 0850  BP: (!) 143/105  Pulse: 99  Resp: 18  Temp: 98.1 F (36.7 C)  SpO2: 99%    General: Awake, alert, oriented. CV:  Good peripheral perfusion. Regular rate and rhythm. Resp:  Normal effort. Lungs clear. Abd:  No distention. Tender to palpation in the left abdomen.    ED Results / Procedures / Treatments   Labs (all labs ordered are listed, but only abnormal results are displayed) Labs Reviewed  COMPREHENSIVE METABOLIC PANEL - Abnormal; Notable for the following components:      Result Value   Glucose, Bld 196 (*)    Total Protein 8.2 (*)    Albumin 3.3 (*)    AST 14 (*)    All other components within normal limits  CBC - Abnormal; Notable for the following components:   WBC  15.6 (*)    Hemoglobin 11.6 (*)    HCT 37.6 (*)    MCH 25.7 (*)    Platelets 565 (*)    All other components within normal limits  URINALYSIS, ROUTINE W REFLEX MICROSCOPIC - Abnormal; Notable for the following components:   Color, Urine YELLOW (*)    APPearance CLEAR (*)    Specific Gravity, Urine >1.046 (*)    Glucose, UA 50 (*)    Ketones, ur 20 (*)    All other components within normal limits  C DIFFICILE QUICK SCREEN W PCR REFLEX    GASTROINTESTINAL PANEL BY PCR, STOOL (REPLACES STOOL CULTURE)  LIPASE, BLOOD  SAMPLE TO BLOOD BANK     EKG  None   RADIOLOGY I independently interpreted and visualized the ct abd/pel. My interpretation: No free air. Radiology interpretation:  IMPRESSION:  1. No acute abdominal/pelvic findings, mass lesions or adenopathy.  2. Normal appearance of the terminal ileum. No findings suspicious  for Crohn's disease.  3. Sigmoid colon diverticulosis but no findings for acute  diverticulitis.      PROCEDURES:  Critical Care performed: No  Procedures   MEDICATIONS ORDERED IN ED: Medications - No data to display   IMPRESSION / MDM / ASSESSMENT AND PLAN / ED COURSE  I reviewed the triage vital signs and the nursing notes.                              Differential diagnosis includes, but is not limited to, diverticulitis, perferation, IBS, crohns  Patient's presentation is most consistent with acute presentation with potential threat to life or bodily function.  Patient presents to the emergency department today with concerns for continued abdominal pain.  CT scan today did not show any acute abnormality.  Patient did have a mild leukocytosis on blood work although has had leukocytosis on other recent blood work.  He did feel better after Haldol.  Was able to give stool sample.  Patient was discharged prior to stool sample resulting.  Patient will follow-up with primary care and GI. Discussed possibility of trying oral haldol for pain  given lack of relief with bentyl. Patient would like to try it.   FINAL CLINICAL IMPRESSION(S) / ED DIAGNOSES   Final diagnoses:  Abdominal pain, unspecified abdominal location      Note:  This document was prepared using Dragon voice recognition software and may include unintentional dictation errors.    Phineas Semen, MD 07/07/22 1319

## 2022-07-07 NOTE — ED Triage Notes (Signed)
Pt c/o mid abd cramping, more in the LLQ for the past "30 weeks" since changing medications, states he has not had a solid stool for over a year and has been having watery,bloody stools. Pt is in NAD, ambulatory with a steady gait.

## 2022-07-10 NOTE — Telephone Encounter (Signed)
Looks like he is scheduled for August 2nd.  Jorge Wright

## 2022-07-14 ENCOUNTER — Other Ambulatory Visit: Payer: Self-pay

## 2022-07-14 ENCOUNTER — Emergency Department
Admission: EM | Admit: 2022-07-14 | Discharge: 2022-07-14 | Disposition: A | Discharge status: Home / Self Care | Payer: 59 | Attending: Emergency Medicine | Admitting: Emergency Medicine

## 2022-07-14 DIAGNOSIS — I10 Essential (primary) hypertension: Secondary | ICD-10-CM | POA: Diagnosis not present

## 2022-07-14 DIAGNOSIS — K625 Hemorrhage of anus and rectum: Secondary | ICD-10-CM | POA: Diagnosis present

## 2022-07-14 DIAGNOSIS — E119 Type 2 diabetes mellitus without complications: Secondary | ICD-10-CM | POA: Insufficient documentation

## 2022-07-14 DIAGNOSIS — R1032 Left lower quadrant pain: Secondary | ICD-10-CM | POA: Diagnosis not present

## 2022-07-14 DIAGNOSIS — R197 Diarrhea, unspecified: Secondary | ICD-10-CM | POA: Diagnosis not present

## 2022-07-14 DIAGNOSIS — Z8616 Personal history of COVID-19: Secondary | ICD-10-CM | POA: Diagnosis not present

## 2022-07-14 DIAGNOSIS — M25571 Pain in right ankle and joints of right foot: Secondary | ICD-10-CM | POA: Diagnosis not present

## 2022-07-14 DIAGNOSIS — M25561 Pain in right knee: Secondary | ICD-10-CM | POA: Diagnosis not present

## 2022-07-14 LAB — COMPREHENSIVE METABOLIC PANEL
ALT: 17 U/L (ref 0–44)
AST: 16 U/L (ref 15–41)
Albumin: 2.9 g/dL — ABNORMAL LOW (ref 3.5–5.0)
Alkaline Phosphatase: 45 U/L (ref 38–126)
Anion gap: 5 (ref 5–15)
BUN: 10 mg/dL (ref 6–20)
CO2: 24 mmol/L (ref 22–32)
Calcium: 9.1 mg/dL (ref 8.9–10.3)
Chloride: 110 mmol/L (ref 98–111)
Creatinine, Ser: 1.02 mg/dL (ref 0.61–1.24)
GFR, Estimated: >60 mL/min (ref >60)
Glucose, Bld: 202 mg/dL — ABNORMAL HIGH (ref 70–99)
Potassium: 3.7 mmol/L (ref 3.5–5.1)
Sodium: 139 mmol/L (ref 135–145)
Total Bilirubin: 0.2 mg/dL — ABNORMAL LOW (ref 0.3–1.2)
Total Protein: 7.3 g/dL (ref 6.5–8.1)

## 2022-07-14 LAB — CBC
HCT: 33.4 % — ABNORMAL LOW (ref 39.0–52.0)
Hemoglobin: 10.3 g/dL — ABNORMAL LOW (ref 13.0–17.0)
MCH: 25.2 pg — ABNORMAL LOW (ref 26.0–34.0)
MCHC: 30.8 g/dL (ref 30.0–36.0)
MCV: 81.9 fL (ref 80.0–100.0)
Platelets: 538 10*3/uL — ABNORMAL HIGH (ref 150–400)
RBC: 4.08 MIL/uL — ABNORMAL LOW (ref 4.22–5.81)
RDW: 15 % (ref 11.5–15.5)
WBC: 10.9 10*3/uL — ABNORMAL HIGH (ref 4.0–10.5)
nRBC: 0 % (ref 0.0–0.2)

## 2022-07-14 LAB — TYPE AND SCREEN
ABO/RH(D): O NEG
Antibody Screen: NEGATIVE

## 2022-07-14 NOTE — ED Provider Notes (Signed)
[facility] Provider Note    Event Date/Time   First MD Initiated Contact with Patient 07/14/22 1450     (approximate)   History   Rectal Bleeding   HPI  Jorge Wright is a 36 y.o. male with past medical history of hypertension hyperlipidemia and diabetes presents with abdominal pain and blood in his stool.  Patient has had symptoms for what he notes is 30 weeks.  He endorses left lower quadrant pain that is intermittent associated with episodes of diarrhea that is bloody.  Patient has seen GI as an outpatient and is scheduled for colonoscopy next week.  Patient notes that he is passing jellybean sized clots and has bright red blood that occasionally fills the toilet.  Sometimes it is mixed in with the stool other times it is not.  Bleeding seems to have picked up and intensity.  Has about 6-12 bowel movements per day.  Notes that he has not had a formed bowel movement in over a year.  Also endorses decreased p.o. intake primarily because he is afraid as it will increase the amount of stool output.  Denies fevers chills no rashes does complain of some right ankle and right knee pain.  No family history of IBD.  Does have an aunt has celiac disease so he cut out gluten recently which did not seem to help his symptoms.  Patient Dors is significant fatigue sleeping 14 hours a day.  Patient was seen by GI on 5/10 initially, had stool studies done.  Plan was for outpatient colonoscopy which initially was scheduled for August 2 however there was a cancellation he is now scheduled for next Wednesday 7/26.  Past Medical History:  Diagnosis Date   Chicken pox    COVID-19    12/2020 sob mild, 05/21/21   Depression    Diabetes mellitus without complication ([facility])    History of alcohol abuse    Hyperlipidemia    Hypertension     Patient Active Problem List   Diagnosis Date Noted   Diverticulosis 04/27/2022   Aortic atherosclerosis ([facility]CC) 04/27/2022   Vitamin B12  deficiency 07/13/2020   Elevated liver enzymes 07/08/2020   Hypertension associated with diabetes ([facility]CC) 06/03/2020   Depression, recurrent (HCC) 06/03/2020   Vitamin D deficiency 08/29/2018   Hepatic steatosis 05/31/2017   Annual physical exam 04/22/2017   DM type 2 (diabetes mellitus, type 2) (HCC) 06/14/2016   HTN (hypertension) 06/14/2016   HLD (hyperlipidemia) 06/14/2016   Anxiety and depression 06/14/2016   Alcoholism in recovery ([facility]CC) 05/01/2014     Physical Exam  Triage Vital Signs: ED Triage Vitals  Enc Vitals Group     BP 07/14/22 1408 133/78     Pulse Rate 07/14/22 1408 (!) 103     Resp 07/14/22 1408 16     Temp 07/14/22 1408 98.3 F (36.8 C)     Temp Source 07/14/22 1408 Oral     SpO2 07/14/22 1408 98 %     Weight 07/14/22 1411 201 lb (91.2 kg)     Height 07/14/22 1411 6' (1.829 m)     Head Circumference --      Peak Flow --      Pain Score 07/14/22 1410 7     Pain Loc --      Pain Edu? --      Excl. in GC? --     Most recent vital signs: Vitals:   07/14/22 1608 07/14/22 1611  BP: (!) 135/96  Pulse: 97   Resp: 18   Temp:  98.5 F (36.9 C)  SpO2: 98%      General: Awake, no distress.  CV:  Good peripheral perfusion.  Resp:  Normal effort.  Abd:  No distention.  Mild tenderness to palpation left lower quadrant Neuro:             Awake, Alert, Oriented x 3  Other:  Scant pink blood on rectal exam no melena   ED Results / Procedures / Treatments  Labs (all labs ordered are listed, but only abnormal results are displayed) Labs Reviewed  COMPREHENSIVE METABOLIC PANEL - Abnormal; Notable for the following components:      Result Value   Glucose, Bld 202 (*)    Albumin 2.9 (*)    Total Bilirubin 0.2 (*)    All other components within normal limits  CBC - Abnormal; Notable for the following components:   WBC 10.9 (*)    RBC 4.08 (*)    Hemoglobin 10.3 (*)    HCT 33.4 (*)    MCH 25.2 (*)    Platelets 538 (*)    All other components within  normal limits  TYPE AND SCREEN     EKG     RADIOLOGY    PROCEDURES:  Critical Care performed: No  Procedures  The patient is on the cardiac monitor to evaluate for evidence of arrhythmia and/or significant heart rate changes.   MEDICATIONS ORDERED IN ED: Medications - No data to display   IMPRESSION / MDM / ASSESSMENT AND PLAN / ED COURSE  I reviewed the triage vital signs and the nursing notes.                              Patient's presentation is most consistent with acute presentation with potential threat to life or bodily function.  Differential diagnosis includes, but is not limited to, diverticulosis, IBD, celiac disease, hemorrhoidal bleeding  Patient is a 36 year old male who presents with multiple weeks of left lower quadrant abdominal pain diarrhea and blood in his stool.  He is currently seeing GI.  He is scheduled for an outpatient colonoscopy.  Presents today because of ongoing pain increasing fatigue and worsening bleeding.  Blood is sometimes with wiping sometimes feels the toilet sometimes next in with the stool.  Does show me some pictures of bright red blood in the toilet.  This has been going on for almost 30 weeks.  He had a CT done about a week ago which was negative for acute pathology.  Today he looks well abdomen is mildly tender in the left lower quadrant he has some scant pink mucousy blood on rectal exam.  Hemoglobin today is 10.3, from 11.6 7 days ago, has slowly down trended from 13 5 months ago.  Differential is as above.  My primary concern would be for IBD especially with the weight loss and systemic symptoms.  Discussed with Dr. Norma Fredrickson on-call with GI about whether he felt that admission to the hospital for more expedited.  Colonoscopy would be clinically warranted and he felt that given the patient is hemodynamically stable with symptoms going on for as long as they have and only slowly downtrending hemoglobin that patient would likely not be  able to have a colonoscopy for several days as there are patients in front of him and that it would not be the best use of resources.  I agree that clinically patient  looks well and my suspicion for hemodynamically significant lower GI bleed is low.  I am most reassured that he has a colonoscopy scheduled for next week.  Discussed with the patient that if bleeding becomes more brisk his pain is uncontrollable he has fevers unable to eat or drink that he can always return to the emergency department for reevaluation.       FINAL CLINICAL IMPRESSION(S) / ED DIAGNOSES   Final diagnoses:  Rectal bleeding  Left lower quadrant abdominal pain     Rx / DC Orders   ED Discharge Orders     None        Note:  This document was prepared using Dragon voice recognition software and may include unintentional dictation errors.   Rada Hay, MD 07/14/22 [phone]

## 2022-07-14 NOTE — ED Triage Notes (Signed)
Pt to ED via POV with wife and father with concerns over bright red rectal bleeding for 30+ weeks with finger-size blood clots. Pt showed this RN pics on phone of bright red blood in toilet. Pt states has lost over 50# since onset of GI bleeding. Endorses intermittent dizziness and has been very low energy, unable to sit up and type at computer for longer than 2 hours without needing to lay down and rest.  Complains of LLQ cramping abdominal pain.   Pt has not been taking prescribed diuretic for 3 weeks due to weakness and concern for dehydration. States Bms have been watery for a year. Has between 6-12 watery and bloody Bms per day.   Pt has GI appt for upper and lower endoscopy on 7/26. Father of pt demanding that this RN change this appt to sooner. Explained that triage nurse is unable to change appts.   Pt takes metformin and insulin for DM2.

## 2022-07-14 NOTE — ED Notes (Signed)
ED Provider at bedside. 

## 2022-07-14 NOTE — ED Notes (Signed)
Pt verbalized understanding of discharge instructions. Opportunity for questions provided.  

## 2022-07-14 NOTE — Discharge Instructions (Addendum)
Please follow-up with Dr. Mart Piggs office.

## 2022-07-14 NOTE — ED Notes (Signed)
Pt c/o rectal bleeding x 30+ days.  Pt has appointment next week.

## 2022-07-28 ENCOUNTER — Ambulatory Visit: Payer: 59 | Admitting: Anesthesiology

## 2022-07-28 ENCOUNTER — Ambulatory Visit
Admission: RE | Admit: 2022-07-28 | Discharge: 2022-07-28 | Disposition: A | Discharge status: Home / Self Care | Payer: 59 | Attending: Internal Medicine | Admitting: Internal Medicine

## 2022-07-28 ENCOUNTER — Encounter: Payer: Self-pay | Admitting: Internal Medicine

## 2022-07-28 ENCOUNTER — Other Ambulatory Visit: Payer: Self-pay

## 2022-07-28 ENCOUNTER — Encounter
Admission: RE | Disposition: A | Discharge status: Home / Self Care | Payer: Self-pay | Source: Home / Self Care | Attending: Internal Medicine

## 2022-07-28 DIAGNOSIS — Z794 Long term (current) use of insulin: Secondary | ICD-10-CM | POA: Insufficient documentation

## 2022-07-28 DIAGNOSIS — Z87891 Personal history of nicotine dependence: Secondary | ICD-10-CM | POA: Diagnosis not present

## 2022-07-28 DIAGNOSIS — K921 Melena: Secondary | ICD-10-CM | POA: Insufficient documentation

## 2022-07-28 DIAGNOSIS — K529 Noninfective gastroenteritis and colitis, unspecified: Secondary | ICD-10-CM | POA: Insufficient documentation

## 2022-07-28 DIAGNOSIS — K633 Ulcer of intestine: Secondary | ICD-10-CM | POA: Insufficient documentation

## 2022-07-28 DIAGNOSIS — R197 Diarrhea, unspecified: Secondary | ICD-10-CM | POA: Insufficient documentation

## 2022-07-28 DIAGNOSIS — K5190 Ulcerative colitis, unspecified, without complications: Secondary | ICD-10-CM | POA: Diagnosis not present

## 2022-07-28 DIAGNOSIS — F32A Depression, unspecified: Secondary | ICD-10-CM | POA: Diagnosis not present

## 2022-07-28 DIAGNOSIS — I10 Essential (primary) hypertension: Secondary | ICD-10-CM | POA: Insufficient documentation

## 2022-07-28 DIAGNOSIS — R1084 Generalized abdominal pain: Secondary | ICD-10-CM | POA: Insufficient documentation

## 2022-07-28 DIAGNOSIS — K51914 Ulcerative colitis, unspecified with abscess: Secondary | ICD-10-CM | POA: Insufficient documentation

## 2022-07-28 DIAGNOSIS — Z79899 Other long term (current) drug therapy: Secondary | ICD-10-CM | POA: Insufficient documentation

## 2022-07-28 DIAGNOSIS — F419 Anxiety disorder, unspecified: Secondary | ICD-10-CM | POA: Insufficient documentation

## 2022-07-28 DIAGNOSIS — K640 First degree hemorrhoids: Secondary | ICD-10-CM | POA: Diagnosis not present

## 2022-07-28 DIAGNOSIS — Z7984 Long term (current) use of oral hypoglycemic drugs: Secondary | ICD-10-CM | POA: Insufficient documentation

## 2022-07-28 DIAGNOSIS — K3189 Other diseases of stomach and duodenum: Secondary | ICD-10-CM | POA: Diagnosis not present

## 2022-07-28 DIAGNOSIS — K2950 Unspecified chronic gastritis without bleeding: Secondary | ICD-10-CM | POA: Insufficient documentation

## 2022-07-28 DIAGNOSIS — E119 Type 2 diabetes mellitus without complications: Secondary | ICD-10-CM | POA: Insufficient documentation

## 2022-07-28 DIAGNOSIS — K6289 Other specified diseases of anus and rectum: Secondary | ICD-10-CM | POA: Diagnosis not present

## 2022-07-28 HISTORY — PX: ESOPHAGOGASTRODUODENOSCOPY (EGD) WITH PROPOFOL: SHX5813

## 2022-07-28 HISTORY — PX: COLONOSCOPY WITH PROPOFOL: SHX5780

## 2022-07-28 LAB — HM COLONOSCOPY

## 2022-07-28 LAB — GLUCOSE, CAPILLARY: Glucose-Capillary: 150 mg/dL — ABNORMAL HIGH (ref 70–99)

## 2022-07-28 SURGERY — COLONOSCOPY WITH PROPOFOL
Anesthesia: General

## 2022-07-28 MED ORDER — MIDAZOLAM HCL 2 MG/2ML IJ SOLN
INTRAMUSCULAR | Status: DC | PRN
  Administered 2022-07-28 (×2): 2 mg via INTRAVENOUS

## 2022-07-28 MED ORDER — PROPOFOL 10 MG/ML IV BOLUS
INTRAVENOUS | Status: DC | PRN
  Administered 2022-07-28: 30 mg via INTRAVENOUS
  Administered 2022-07-28 (×2): 50 mg via INTRAVENOUS
  Administered 2022-07-28: 20 mg via INTRAVENOUS
  Administered 2022-07-28 (×2): 50 mg via INTRAVENOUS
  Administered 2022-07-28: 80 mg via INTRAVENOUS

## 2022-07-28 MED ORDER — MIDAZOLAM HCL 2 MG/2ML IJ SOLN
INTRAMUSCULAR | Status: AC
  Filled 2022-07-28: qty 2

## 2022-07-28 MED ORDER — SODIUM CHLORIDE 0.9 % IV SOLN
INTRAVENOUS | Status: DC
Start: 2022-07-28 — End: 2022-07-28

## 2022-07-28 MED ORDER — LIDOCAINE HCL (CARDIAC) PF 100 MG/5ML IV SOSY
PREFILLED_SYRINGE | INTRAVENOUS | Status: DC | PRN
  Administered 2022-07-28: 50 mg via INTRAVENOUS

## 2022-07-28 MED ORDER — PROPOFOL 500 MG/50ML IV EMUL
INTRAVENOUS | Status: DC | PRN
  Administered 2022-07-28: 150 ug/kg/min via INTRAVENOUS

## 2022-07-28 MED ORDER — DEXMEDETOMIDINE (PRECEDEX) IN NS 20 MCG/5ML (4 MCG/ML) IV SYRINGE
PREFILLED_SYRINGE | INTRAVENOUS | Status: DC | PRN
  Administered 2022-07-28 (×2): 8 ug via INTRAVENOUS
  Administered 2022-07-28: 4 ug via INTRAVENOUS
  Administered 2022-07-28: 8 ug via INTRAVENOUS

## 2022-07-28 NOTE — Op Note (Signed)
[facility] Gastroenterology Patient Name: Jorge Wright Procedure Date: 07/28/2022 3:00 PM MRN: [id number] Account #: [id number] Date of Birth: [birth year] Admit Type: Outpatient Age: 36 Room: [facility] ENDO ROOM 2 Gender: Male Note Status: Finalized Instrument Name: Prentice Docker [id number] Procedure:             Colonoscopy Indications:           Generalized abdominal pain, Chronic diarrhea,                         Hematochezia Providers:             Boykin Nearing. Norma Fredrickson MD, MD Referring MD:          Pasty Spillers Mclean-Scocuzza MD, MD (Referring MD) Medicines:             Propofol per Anesthesia Complications:         No immediate complications. Procedure:             Pre-Anesthesia Assessment:                        - The risks and benefits of the procedure and the                         sedation options and risks were discussed with the                         patient. All questions were answered and informed                         consent was obtained.                        - Patient identification and proposed procedure were                         verified prior to the procedure by the nurse. The                         procedure was verified in the procedure room.                        - ASA Grade Assessment: III - A patient with severe                         systemic disease.                        - After reviewing the risks and benefits, the patient                         was deemed in satisfactory condition to undergo the                         procedure.                        After obtaining informed consent, the colonoscope was                         passed under direct vision. Throughout  the procedure,                         the patient's blood pressure, pulse, and oxygen                         saturations were monitored continuously. The                         Colonoscope was introduced through the anus and                         advanced to the  the terminal ileum, with                         identification of the appendiceal orifice and IC                         valve. The colonoscopy was performed without                         difficulty. The patient tolerated the procedure well.                         The quality of the bowel preparation was adequate. The                         terminal ileum, ileocecal valve, appendiceal orifice,                         and rectum were photographed. Findings:      The perianal and digital rectal examinations were normal. Pertinent       negatives include normal sphincter tone and no palpable rectal lesions.      Non-bleeding internal hemorrhoids were found during retroflexion. The       hemorrhoids were Grade I (internal hemorrhoids that do not prolapse).      Multiple 2-8 mm ulcers were found in the sigmoid colon, in the       descending colon, in the transverse colon, in the ascending colon and in       the cecum. No bleeding was present. No stigmata of recent bleeding were       seen. Biopsies were taken with a cold forceps for histology.      The terminal ileum appeared normal. Biopsies were taken with a cold       forceps for histology.      A patchy area of mildly friable mucosa with contact bleeding was found       in the rectum. Biopsies were taken with a cold forceps for histology.      The exam was otherwise without abnormality. Impression:            - Non-bleeding internal hemorrhoids.                        - Multiple ulcers in the sigmoid colon, in the                         descending colon, in the transverse colon, in the  ascending colon and in the cecum. Biopsied.                        - The examined portion of the ileum was normal.                         Biopsied.                        - Friability with contact bleeding in the rectum.                         Biopsied.                        - The examination was otherwise  normal. Recommendation:        - Await pathology results from EGD, also performed                         today.                        - Patient has a contact number available for                         emergencies. The signs and symptoms of potential                         delayed complications were discussed with the patient.                         Return to normal activities tomorrow. Written                         discharge instructions were provided to the patient.                        - Resume previous diet.                        - Continue present medications.                        - No aspirin, ibuprofen, naproxen, or other                         non-steroidal anti-inflammatory drugs.                        - Await pathology results.                        - Return to physician assistant in 4 weeks.                        - The findings and recommendations were discussed with                         the patient.                        - Follow up with Jacob Moores,  PA-C in the GI office.                         [phone]                        - Telephone GI office to schedule appointment.                        - The findings and recommendations were discussed with                         the patient. Procedure Code(s):     --- Professional ---                        [phone], Colonoscopy, flexible; with biopsy, single or                         multiple Diagnosis Code(s):     --- Professional ---                        K92.1, Melena (includes Hematochezia)                        K52.9, Noninfective gastroenteritis and colitis,                         unspecified                        R10.84, Generalized abdominal pain                        K62.5, Hemorrhage of anus and rectum                        K64.0, First degree hemorrhoids                        K63.3, Ulcer of intestine CPT copyright 2019 American Medical Association. All rights reserved. The codes  documented in this report are preliminary and upon coder review may  be revised to meet current compliance requirements. Stanton Kidney MD, MD 07/28/2022 4:05:06 PM This report has been signed electronically. Number of Addenda: 0 Note Initiated On: 07/28/2022 3:00 PM Scope Withdrawal Time: 0 hours 8 minutes 30 seconds  Total Procedure Duration: 0 hours 15 minutes 13 seconds  Estimated Blood Loss:  Estimated blood loss was minimal.      [facility]

## 2022-07-28 NOTE — Anesthesia Preprocedure Evaluation (Signed)
Anesthesia Evaluation  Patient identified by MRN, date of birth, ID band  Reviewed: Allergy & Precautions, NPO status , Patient's Chart, lab work & pertinent test results  Airway Mallampati: II  TM Distance: >3 FB     Dental  (+) Teeth Intact   Pulmonary neg pulmonary ROS, former smoker,    breath sounds clear to auscultation       Cardiovascular hypertension, Pt. on medications  Rhythm:Regular     Neuro/Psych Anxiety Depression    GI/Hepatic negative GI ROS, Neg liver ROS,   Endo/Other  diabetes, Well Controlled, Insulin Dependent  Renal/GU      Musculoskeletal   Abdominal Normal abdominal exam  (+)   Peds negative pediatric ROS (+)  Hematology negative hematology ROS (+)   Anesthesia Other Findings   Reproductive/Obstetrics                            Anesthesia Physical Anesthesia Plan  ASA: 2  Anesthesia Plan: General   Post-op Pain Management:    Induction: Intravenous  PONV Risk Score and Plan:   Airway Management Planned: Natural Airway  Additional Equipment:   Intra-op Plan:   Post-operative Plan:   Informed Consent: I have reviewed the patients History and Physical, chart, labs and discussed the procedure including the risks, benefits and alternatives for the proposed anesthesia with the patient or authorized representative who has indicated his/her understanding and acceptance.       Plan Discussed with: CRNA and Surgeon  Anesthesia Plan Comments:         Anesthesia Quick Evaluation

## 2022-07-28 NOTE — Interval H&P Note (Signed)
History and Physical Interval Note:  07/28/2022 3:08 PM  Jorge Wright  has presented today for surgery, with the diagnosis of rectal bleeding, abdominal pain,diarrhea.  The various methods of treatment have been discussed with the patient and family. After consideration of risks, benefits and other options for treatment, the patient has consented to  Procedure(s) with comments: COLONOSCOPY WITH PROPOFOL (N/A) - OK'D PER PM ESOPHAGOGASTRODUODENOSCOPY (EGD) WITH PROPOFOL (N/A) as a surgical intervention.  The patient's history has been reviewed, patient examined, no change in status, stable for surgery.  I have reviewed the patient's chart and labs.  Questions were answered to the patient's satisfaction.     [place], [place]

## 2022-07-28 NOTE — H&P (Signed)
Outpatient short stay form Pre-procedure 07/28/2022 3:06 PM Ridwan Bondy K. Alice Reichert, M.D.  Primary Physician: Orland Mustard, M.D.  Reason for visit:  abdominal pain, bloody diarrhea, sb intussusception on CTE  History of present illness:   Mr. Shukla is a 36 y.o. male presenting for consultation for abdominal pain, dyspepsia, bloody diarrhea. There was note of intussuception on recent CT- do note that symptoms have been improving over the last 2w. He does not have an acute abdomen and is well appearing. Broad ddx- including celiac, IBD, others. Bland diet, liberal hydration. 2 view abdominal films (stat) today. Referral to surgeon for opinion.  Labs today: stool pcr/fecal calprotectin, celiac panel, cbc, cmp, esr, crp Schedule egd and colonsocopy. Explained not to stop gluten presently if celiac abnormal as duodenal biopsies will be needed to confirm or disprove. Procedure information given: indications, benefits, risks- including, but not limited to bleeding, infection, perforation, difficulty with sedation, were discussed with the patient, and they are agreeable to the procedure Follow up as needed after procedure, sooner if problems    Current Facility-Administered Medications:    0.9 %  sodium chloride infusion, , Intravenous, Continuous, Aqua Denslow, Benay Pike, MD, Last Rate: 20 mL/hr at 07/28/22 1505, Continued from Pre-op at 07/28/22 1505  Medications Prior to Admission  Medication Sig Dispense Refill Last Dose   amLODipine (NORVASC) 2.5 MG tablet Take 1 tablet (2.5 mg total) by mouth daily. 90 tablet 3 07/27/2022   atorvastatin (LIPITOR) 40 MG tablet TAKE 1 TABLET DAILY AT NIGHT 90 tablet 3 07/27/2022   Cholecalciferol 1.25 MG (50000 UT) capsule Take 1 capsule (50,000 Units total) by mouth once a week. d3 13 capsule 3 Past Week   cyanocobalamin (,VITAMIN B-12,) 1000 MCG/ML injection Inject 1 mL (1,000 mcg total) into the muscle every 30 (thirty) days. 3 mL 3 Past Week   escitalopram  (LEXAPRO) 20 MG tablet TAKE 1 TABLET DAILY 90 tablet 3 07/27/2022   lisinopril-hydrochlorothiazide (ZESTORETIC) 20-12.5 MG tablet TAKE 2 TABLETS DAILY IN THE MORNING (INCREASED DOSE, DISCONTINUE PREVIOUS PRESCRIPTION) 180 tablet 3 07/27/2022   metFORMIN (GLUCOPHAGE-XR) 500 MG 24 hr tablet Take 2 tablets (1,000 mg total) by mouth 2 (two) times daily. 360 tablet 3 Past Week   Continuous Blood Gluc Sensor (FREESTYLE LIBRE 3 SENSOR) MISC Apply 1 each topically every 14 (fourteen) days. Place 1 sensor on the skin every 14 days. Use to check glucose continuously 2 each 11    dicyclomine (BENTYL) 20 MG tablet Take 1 tablet (20 mg total) by mouth 4 (four) times daily -  before meals and at bedtime. 120 tablet 3    fenofibrate (TRICOR) 145 MG tablet Take 1 tablet (145 mg total) by mouth daily. D/c zetia 10 (Patient not taking: Reported on 04/20/2022) 90 tablet 3    haloperidol (HALDOL) 2 MG tablet Take 1 tablet (2 mg total) by mouth 2 (two) times daily as needed (abdominal pain). 15 tablet 0    hydrocortisone (ANUSOL-HC) 25 MG suppository Place 1 suppository (25 mg total) rectally every 12 (twelve) hours. 12 suppository 1    insulin degludec (TRESIBA) 100 UNIT/ML FlexTouch Pen Inject 5 Units into the skin daily. Increase by 1 unit each day until am blood sugar 90 to <140. Stop at 10 units max daily with food 9 mL 1    ketorolac (TORADOL) 10 MG tablet Take 1 tablet (10 mg total) by mouth every 12 (twelve) hours as needed for severe pain or moderate pain. (Patient not taking: Reported on 04/20/2022) 12 tablet 0  LORazepam (ATIVAN) 0.5 MG tablet TAKE 1 TABLET(0.5 MG) BY MOUTH DAILY AS NEEDED FOR ANXIETY 30 tablet 2    ondansetron (ZOFRAN) 4 MG tablet Take 1 tablet (4 mg total) by mouth 2 (two) times daily as needed. (Patient not taking: Reported on 03/03/2022) 180 tablet 3      Allergies  Allergen Reactions   Mounjaro [Tirzepatide]     Injection site reaction    Ozempic (0.25 Or 0.5 Mg-Dose) [Semaglutide(0.25 Or  0.7m-Dos)]     N/v/diarrhea/ab pain    Trulicity [Dulaglutide]     Gi Sxs   Invokana [Canagliflozin] Rash    Reports topical rash on trunk that occurred after taking Invokana for about a week. Resolved after medication discontinuation. No airway involvement.    Penicillins Rash     Past Medical History:  Diagnosis Date   Chicken pox    COVID-19    12/2020 sob mild, 05/21/21   Depression    Diabetes mellitus without complication (HGalva    History of alcohol abuse    Hyperlipidemia    Hypertension     Review of systems:  Otherwise negative.    Physical Exam  Gen: Alert, oriented. Appears stated age.  HEENT: [place]/AT. PERRLA. Lungs: CTA, no wheezes. CV: RR nl S1, S2. Abd: soft, benign, no masses. BS+ Ext: No edema. Pulses 2+    Planned procedures: Proceed with EGD and colonoscopy. The patient understands the nature of the planned procedure, indications, risks, alternatives and potential complications including but not limited to bleeding, infection, perforation, damage to internal organs and possible oversedation/side effects from anesthesia. The patient agrees and gives consent to proceed.  Please refer to procedure notes for findings, recommendations and patient disposition/instructions.     Andraya Frigon K. TAlice Reichert M.D. Gastroenterology 07/28/2022  3:06 PM

## 2022-07-28 NOTE — Op Note (Signed)
[facility] Gastroenterology Patient Name: Jorge Wright Procedure Date: 07/28/2022 3:01 PM MRN: [id number] Account #: [id number] Date of Birth: 01/07/1986 Admit Type: Outpatient Age: 36 Room: [facility] ENDO ROOM 2 Gender: Male Note Status: Finalized Instrument Name: Upper Endoscope [id number] Procedure:             Upper GI endoscopy Indications:           Generalized abdominal pain, Hematochezia Providers:             Boykin Nearing. Norma Fredrickson MD, MD Referring MD:          Pasty Spillers Mclean-Scocuzza MD, MD (Referring MD) Medicines:             Propofol per Anesthesia Complications:         No immediate complications. Procedure:             Pre-Anesthesia Assessment:                        - The risks and benefits of the procedure and the                         sedation options and risks were discussed with the                         patient. All questions were answered and informed                         consent was obtained.                        - Patient identification and proposed procedure were                         verified prior to the procedure by the nurse. The                         procedure was verified in the procedure room.                        - ASA Grade Assessment: III - A patient with severe                         systemic disease.                        - After reviewing the risks and benefits, the patient                         was deemed in satisfactory condition to undergo the                         procedure.                        After obtaining informed consent, the endoscope was                         passed under direct vision. Throughout the procedure,  the patient's blood pressure, pulse, and oxygen                         saturations were monitored continuously. The Endoscope                         was introduced through the mouth, and advanced to the                         third part of duodenum. The upper GI  endoscopy was                         accomplished without difficulty. The patient tolerated                         the procedure well. Findings:      The esophagus was normal.      Patchy mild inflammation characterized by erythema was found in the       gastric antrum. Biopsies were taken with a cold forceps for Helicobacter       pylori testing.      The cardia and gastric fundus were normal on retroflexion.      Patchy moderately erythematous mucosa and with no stigmata of bleeding       was found in the duodenal bulb.      The second portion of the duodenum and third portion of the duodenum       were normal. Impression:            - Normal esophagus.                        - Gastritis. Biopsied.                        - Erythematous duodenopathy.                        - Normal second portion of the duodenum and third                         portion of the duodenum. Recommendation:        - Await pathology results.                        - Proceed with colonoscopy Procedure Code(s):     --- Professional ---                        [phone], Esophagogastroduodenoscopy, flexible,                         transoral; with biopsy, single or multiple Diagnosis Code(s):     --- Professional ---                        K92.1, Melena (includes Hematochezia)                        R10.84, Generalized abdominal pain                        K31.89, Other diseases of stomach and  duodenum                        K29.70, Gastritis, unspecified, without bleeding CPT copyright 2019 American Medical Association. All rights reserved. The codes documented in this report are preliminary and upon coder review may  be revised to meet current compliance requirements. Stanton Kidney MD, MD 07/28/2022 3:37:32 PM This report has been signed electronically. Number of Addenda: 0 Note Initiated On: 07/28/2022 3:01 PM Estimated Blood Loss:  Estimated blood loss: none. Estimated blood loss: none.      [facility]

## 2022-07-28 NOTE — Transfer of Care (Signed)
Immediate Anesthesia Transfer of Care Note  Patient: Jorge Wright  Procedure(s) Performed: COLONOSCOPY WITH PROPOFOL ESOPHAGOGASTRODUODENOSCOPY (EGD) WITH PROPOFOL  Patient Location: PACU  Anesthesia Type:General  Level of Consciousness: drowsy  Airway & Oxygen Therapy: Patient Spontanous Breathing  Post-op Assessment: Report given to RN and Post -op Vital signs reviewed and stable  Post vital signs: Reviewed and stable  Last Vitals:  Vitals Value Taken Time  BP 111/67 07/28/22 1559  Temp 37 C 07/28/22 1559  Pulse 99 07/28/22 1601  Resp 21 07/28/22 1601  SpO2 98 % 07/28/22 1601  Vitals shown include unvalidated device data.  Last Pain:  Vitals:   07/28/22 1559  TempSrc: Temporal  PainSc: Asleep         Complications: No notable events documented.

## 2022-07-28 NOTE — Anesthesia Procedure Notes (Signed)
Procedure Name: MAC Date/Time: 07/28/2022 3:11 PM  Performed by: Jerrye Noble, CRNAPre-anesthesia Checklist: Patient identified, Emergency Drugs available, Suction available and Patient being monitored Patient Re-evaluated:Patient Re-evaluated prior to induction Oxygen Delivery Method: Nasal cannula

## 2022-07-29 ENCOUNTER — Encounter: Payer: Self-pay | Admitting: Internal Medicine

## 2022-07-29 NOTE — Anesthesia Postprocedure Evaluation (Signed)
Anesthesia Post Note  Patient: Jorge Wright  Procedure(s) Performed: COLONOSCOPY WITH PROPOFOL ESOPHAGOGASTRODUODENOSCOPY (EGD) WITH PROPOFOL  Patient location during evaluation: PACU Anesthesia Type: General Level of consciousness: awake and oriented Pain management: satisfactory to patient Vital Signs Assessment: post-procedure vital signs reviewed and stable Respiratory status: spontaneous breathing Cardiovascular status: stable Anesthetic complications: no   No notable events documented.   Last Vitals:  Vitals:   07/28/22 1609 07/28/22 1619  BP: 133/67   Pulse:  85  Resp:    Temp:    SpO2:  98%    Last Pain:  Vitals:   07/28/22 1619  TempSrc:   PainSc: 0-No pain                 VAN STAVEREN,Gurfateh Mcclain

## 2022-08-02 LAB — SURGICAL PATHOLOGY

## 2022-08-16 ENCOUNTER — Encounter: Payer: Self-pay | Admitting: Internal Medicine

## 2022-08-16 DIAGNOSIS — K2950 Unspecified chronic gastritis without bleeding: Secondary | ICD-10-CM | POA: Insufficient documentation

## 2022-08-16 DIAGNOSIS — K6289 Other specified diseases of anus and rectum: Secondary | ICD-10-CM | POA: Insufficient documentation

## 2022-08-26 ENCOUNTER — Encounter: Payer: Self-pay | Admitting: Internal Medicine

## 2022-11-09 ENCOUNTER — Encounter: Payer: Self-pay | Admitting: *Deleted

## 2022-11-09 ENCOUNTER — Other Ambulatory Visit: Payer: Self-pay | Admitting: *Deleted

## 2022-11-09 DIAGNOSIS — E1165 Type 2 diabetes mellitus with hyperglycemia: Secondary | ICD-10-CM

## 2022-11-09 DIAGNOSIS — E119 Type 2 diabetes mellitus without complications: Secondary | ICD-10-CM

## 2022-11-09 DIAGNOSIS — E538 Deficiency of other specified B group vitamins: Secondary | ICD-10-CM

## 2022-11-09 NOTE — Telephone Encounter (Signed)
Okay to refill?  Send patient a mychart message to notify him to establish care for additional refills.

## 2022-11-10 MED ORDER — FREESTYLE LIBRE 3 SENSOR MISC: 1.0000 | 11 refills | Status: DC

## 2022-11-10 MED ORDER — INSULIN DEGLUDEC 100 UNIT/ML ~~LOC~~ SOPN: 5.0000 [IU] | PEN_INJECTOR | Freq: Every day | SUBCUTANEOUS | 1 refills | Status: DC

## 2022-11-10 MED ORDER — CYANOCOBALAMIN 1000 MCG/ML IJ SOLN: 1000.0000 ug | INTRAMUSCULAR | 3 refills | Status: DC

## 2022-12-10 ENCOUNTER — Telehealth: Payer: Self-pay

## 2022-12-10 ENCOUNTER — Telehealth: Payer: Self-pay | Admitting: Internal Medicine

## 2022-12-10 NOTE — Telephone Encounter (Signed)
Left the patient a voicemail to call the office to establish with a provider or inform us he has established with someone outside of this practice. He will not be able to get further refills without establishing with another provider.

## 2022-12-10 NOTE — Telephone Encounter (Signed)
Please call pt to see if he wishes establish care with a new provider.  We will not be able to refill any further medications until he has an appointment.

## 2022-12-15 NOTE — Telephone Encounter (Signed)
Due to receiving a Rx refill request via fax for Metformin    I reached out to pt to get him scheduled with a provider as pt needs a PCP   Left a detailed msg on machine that we can discuss this refill once he gets an appt. Pt was advised to give [place] a CB

## 2022-12-17 IMAGING — CR DG ABDOMEN 2V
6 series · 6 of 6 positions shown · non-contrast
Comparison: Abdominal CT 04/20/2022

CLINICAL DATA: Abdominal pain.  Intussusception by CT 04/20/2022

EXAM:
ABDOMEN - 2 VIEW

[abdomen erect (1 of 3)]
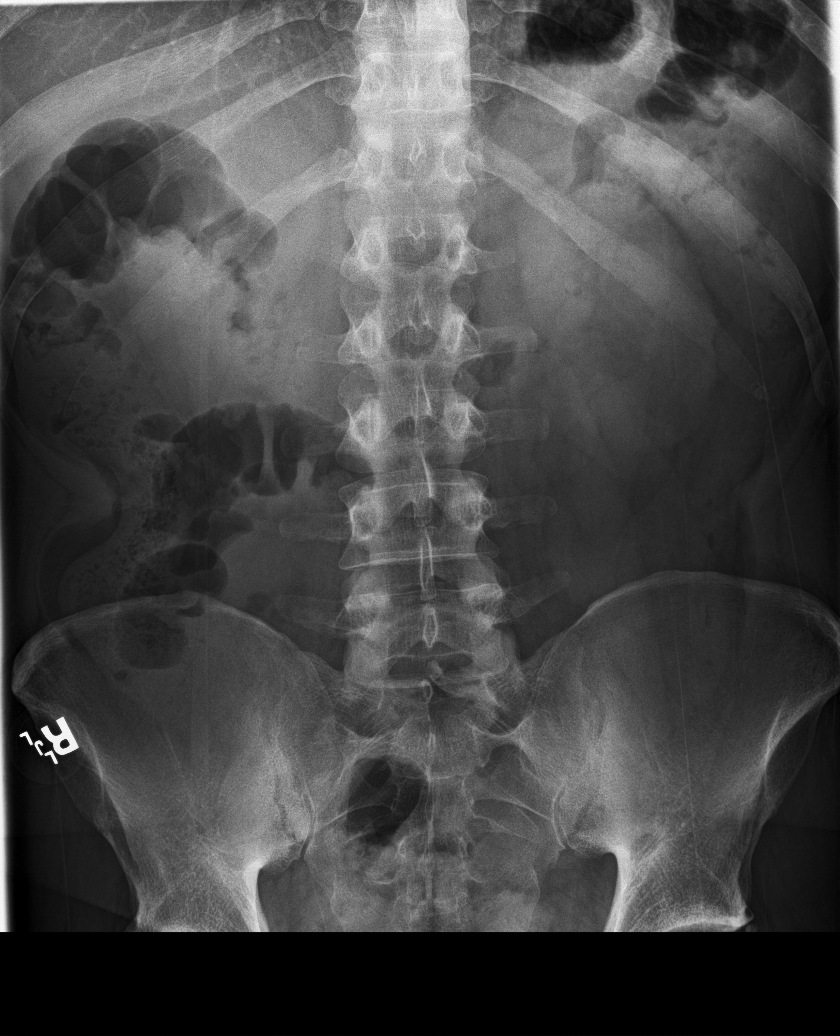

[abdomen erect (2 of 3)]
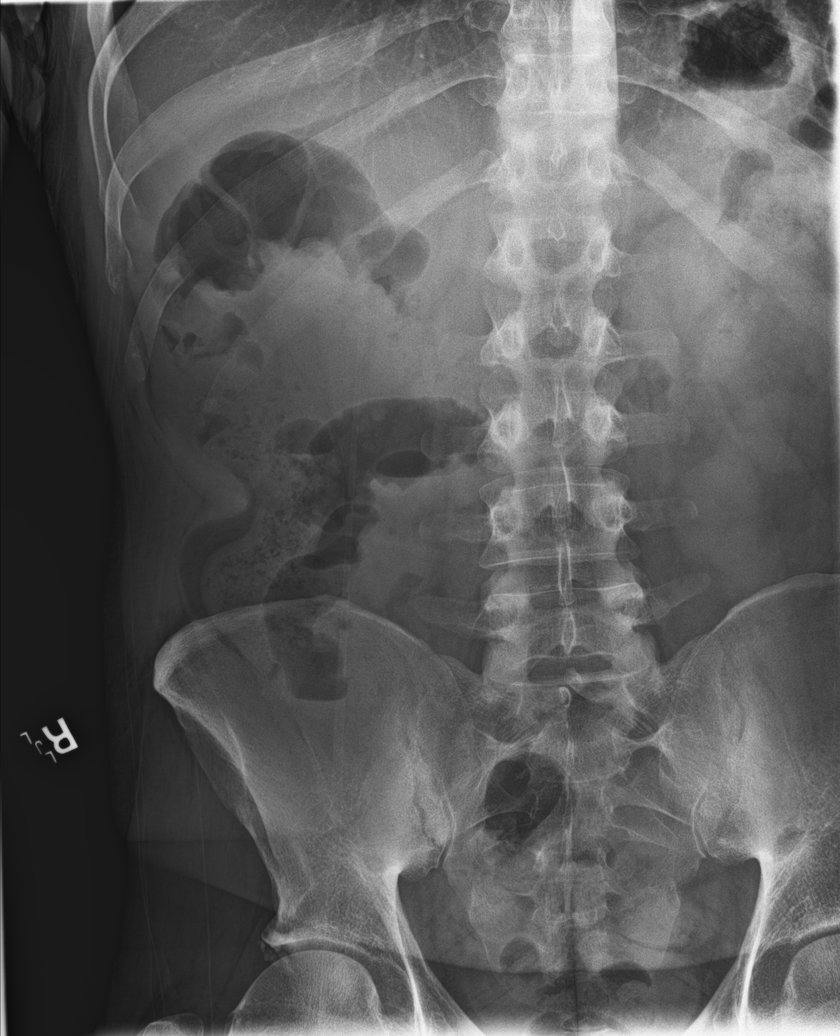

[abdomen supine (1 of 3)]
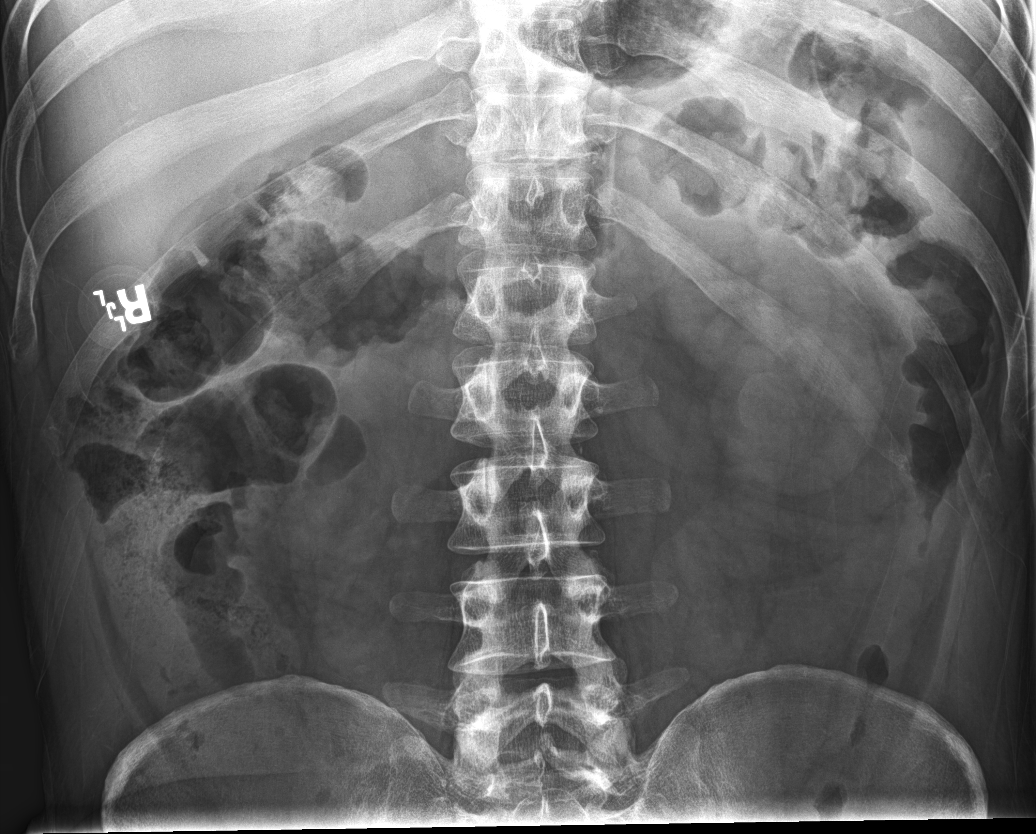

[abdomen supine (2 of 3)]
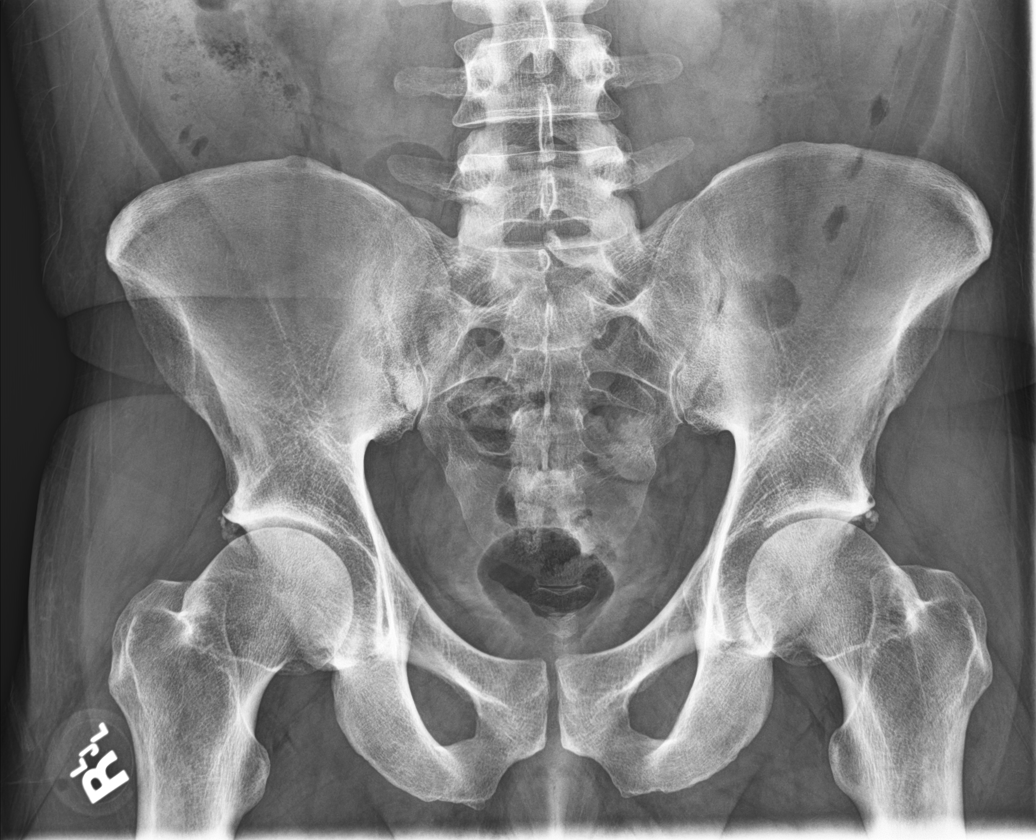

[abdomen erect (3 of 3)]
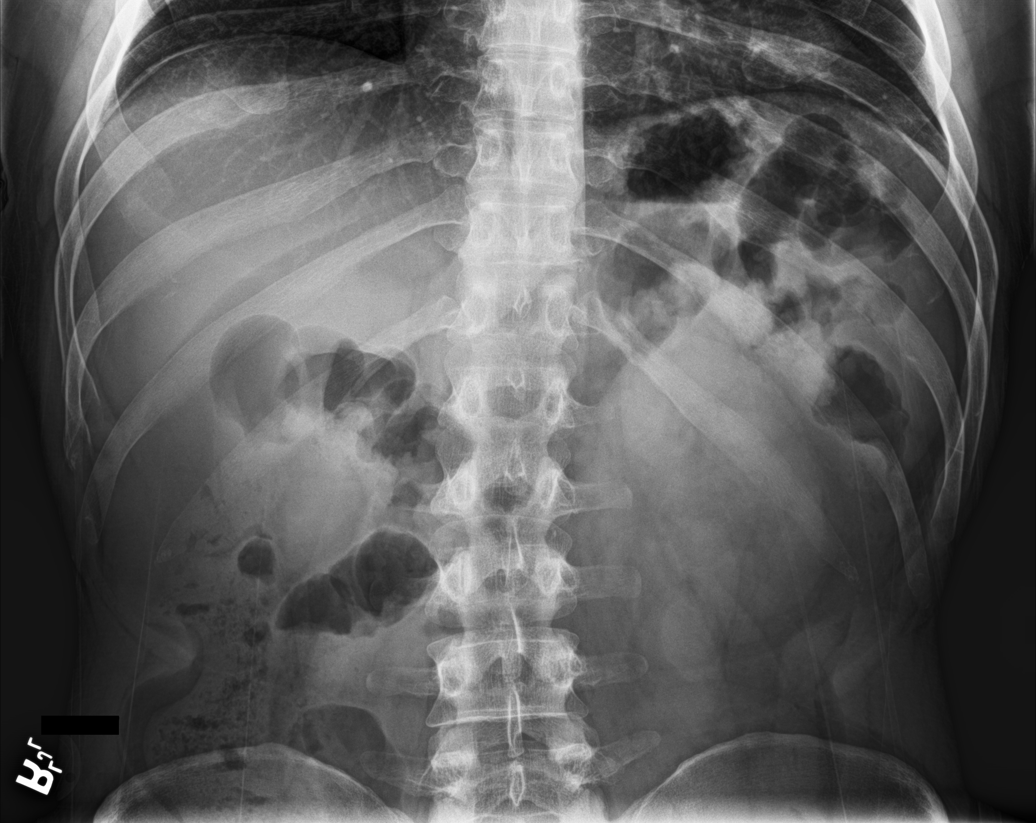

[abdomen supine (3 of 3)]
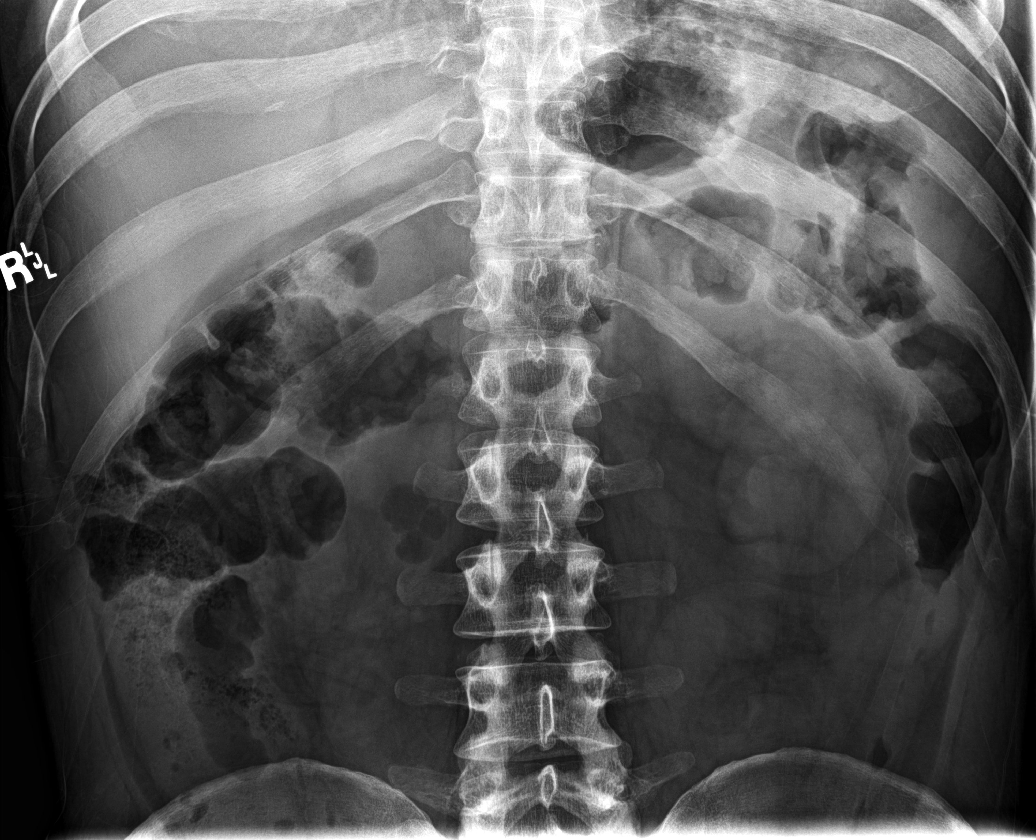

[6 of 6 positions shown; findings below may reference images not displayed]

FINDINGS: Negative for obstructive pattern. Essentially stable bowel gas
distribution when compared to prior scanogram. No concerning mass
effect or calcification.
IMPRESSION: Normal bowel gas pattern.

## 2022-12-31 ENCOUNTER — Ambulatory Visit (INDEPENDENT_AMBULATORY_CARE_PROVIDER_SITE_OTHER): Payer: 59 | Admitting: Nurse Practitioner

## 2022-12-31 ENCOUNTER — Encounter: Payer: Self-pay | Admitting: Nurse Practitioner

## 2022-12-31 ENCOUNTER — Telehealth: Payer: Self-pay

## 2022-12-31 VITALS — BP 118/80 | HR 105 | Temp 98.9°F | Ht 72.0 in | Wt 213.6 lb

## 2022-12-31 DIAGNOSIS — Z1329 Encounter for screening for other suspected endocrine disorder: Secondary | ICD-10-CM | POA: Diagnosis not present

## 2022-12-31 DIAGNOSIS — F419 Anxiety disorder, unspecified: Secondary | ICD-10-CM

## 2022-12-31 DIAGNOSIS — E538 Deficiency of other specified B group vitamins: Secondary | ICD-10-CM

## 2022-12-31 DIAGNOSIS — E785 Hyperlipidemia, unspecified: Secondary | ICD-10-CM

## 2022-12-31 DIAGNOSIS — I152 Hypertension secondary to endocrine disorders: Secondary | ICD-10-CM

## 2022-12-31 DIAGNOSIS — F32A Depression, unspecified: Secondary | ICD-10-CM

## 2022-12-31 DIAGNOSIS — E1159 Type 2 diabetes mellitus with other circulatory complications: Secondary | ICD-10-CM | POA: Diagnosis not present

## 2022-12-31 DIAGNOSIS — E559 Vitamin D deficiency, unspecified: Secondary | ICD-10-CM | POA: Diagnosis not present

## 2022-12-31 DIAGNOSIS — E119 Type 2 diabetes mellitus without complications: Secondary | ICD-10-CM | POA: Diagnosis not present

## 2022-12-31 DIAGNOSIS — Z794 Long term (current) use of insulin: Secondary | ICD-10-CM | POA: Diagnosis not present

## 2022-12-31 LAB — LIPID PANEL
Cholesterol: 176 mg/dL (ref 0–200)
HDL: 46.6 mg/dL (ref >39.00)
LDL Cholesterol: 111 mg/dL — ABNORMAL HIGH (ref 0–99)
NonHDL: 129.14
Total CHOL/HDL Ratio: 4
Triglycerides: 90 mg/dL (ref 0.0–149.0)
VLDL: 18 mg/dL (ref 0.0–40.0)

## 2022-12-31 LAB — CBC WITH DIFFERENTIAL/PLATELET
Basophils Absolute: 0 10*3/uL (ref 0.0–0.1)
Basophils Relative: 0.3 % (ref 0.0–3.0)
Eosinophils Absolute: 0 10*3/uL (ref 0.0–0.7)
Eosinophils Relative: 0.4 % (ref 0.0–5.0)
HCT: 41.5 % (ref 39.0–52.0)
Hemoglobin: 13.6 g/dL (ref 13.0–17.0)
Lymphocytes Relative: 19.4 % (ref 12.0–46.0)
Lymphs Abs: 1.9 10*3/uL (ref 0.7–4.0)
MCHC: 32.7 g/dL (ref 30.0–36.0)
MCV: 84 fl (ref 78.0–100.0)
Monocytes Absolute: 0.6 10*3/uL (ref 0.1–1.0)
Monocytes Relative: 6.4 % (ref 3.0–12.0)
Neutro Abs: 7 10*3/uL (ref 1.4–7.7)
Neutrophils Relative %: 73.5 % (ref 43.0–77.0)
Platelets: 300 10*3/uL (ref 150.0–400.0)
RBC: 4.94 Mil/uL (ref 4.22–5.81)
RDW: 16.8 % — ABNORMAL HIGH (ref 11.5–15.5)
WBC: 9.6 10*3/uL (ref 4.0–10.5)

## 2022-12-31 LAB — COMPREHENSIVE METABOLIC PANEL
ALT: 17 U/L (ref 0–53)
AST: 15 U/L (ref 0–37)
Albumin: 4.5 g/dL (ref 3.5–5.2)
Alkaline Phosphatase: 56 U/L (ref 39–117)
BUN: 17 mg/dL (ref 6–23)
CO2: 28 mEq/L (ref 19–32)
Calcium: 9.6 mg/dL (ref 8.4–10.5)
Chloride: 99 mEq/L (ref 96–112)
Creatinine, Ser: 1.13 mg/dL (ref 0.40–1.50)
GFR: 82.93 mL/min (ref >60.00)
Glucose, Bld: 197 mg/dL — ABNORMAL HIGH (ref 70–99)
Potassium: 4.2 mEq/L (ref 3.5–5.1)
Sodium: 137 mEq/L (ref 135–145)
Total Bilirubin: 0.5 mg/dL (ref 0.2–1.2)
Total Protein: 7.2 g/dL (ref 6.0–8.3)

## 2022-12-31 LAB — TSH: TSH: 1.25 u[IU]/mL (ref 0.35–5.50)

## 2022-12-31 LAB — MICROALBUMIN / CREATININE URINE RATIO
Creatinine,U: 111.8 mg/dL
Microalb Creat Ratio: 1.2 mg/g (ref 0.0–30.0)
Microalb, Ur: 1.3 mg/dL (ref 0.0–1.9)

## 2022-12-31 LAB — VITAMIN D 25 HYDROXY (VIT D DEFICIENCY, FRACTURES): VITD: 54.99 ng/mL (ref 30.00–100.00)

## 2022-12-31 LAB — VITAMIN B12: Vitamin B-12: 282 pg/mL (ref 211–911)

## 2022-12-31 LAB — HEMOGLOBIN A1C: Hgb A1c MFr Bld: 11.3 % — ABNORMAL HIGH (ref 4.6–6.5)

## 2022-12-31 MED ORDER — METFORMIN HCL ER 500 MG PO TB24: 1000.0000 mg | ORAL_TABLET | Freq: Two times a day (BID) | ORAL | 3 refills | Status: DC

## 2022-12-31 MED ORDER — LORAZEPAM 0.5 MG PO TABS: ORAL_TABLET | ORAL | 2 refills | Status: DC

## 2022-12-31 NOTE — Telephone Encounter (Signed)
LMOM for pt to CB in regards to labs 

## 2022-12-31 NOTE — Assessment & Plan Note (Signed)
Has been taking 50,000 IU weekly x 11 months. Will check level today. Advised patient to D/C weekly dosing and decrease to OTC daily dosing.

## 2022-12-31 NOTE — Assessment & Plan Note (Addendum)
Chronic. Stable on Lipitor and Fenofibrate daily. Will check lipids. Encouraged healthy diet and exercise.

## 2022-12-31 NOTE — Assessment & Plan Note (Signed)
Stable on Lisinopril/HCTZ daily. Continue. Labs as outlined.

## 2022-12-31 NOTE — Progress Notes (Signed)
Tomasita Morrow, NP-C Phone: [phone]  Jorge Wright is a 37 y.o. male who presents today for transfer of care. He has no complaints or new concerns today. He is doing well on all of his medications. He is requesting refills.   HYPERTENSION Disease Monitoring: Blood pressure range-Does not check at home Chest pain- No      Dyspnea- No Medications: Compliance- Lis/HCTZ daily Lightheadedness- No   Edema- No  DIABETES Disease Monitoring: Blood Sugar ranges-150-250s Polyuria/phagia/dipsia- No      Optho- Exam UTD Medications: Compliance- Kaidence Aas and Metformin Hypoglycemic symptoms- No  HYPERLIPIDEMIA Disease Monitoring: See symptoms for Hypertension Medications: Compliance- Lipitor and Fenofibrate Right upper quadrant pain- No  Muscle aches- No  Lab Results  Component Value Date   HGBA1C 8.4 (H) 97/35/3299   Last metabolic panel Lab Results  Component Value Date   GLUCOSE 202 (H) 07/14/2022   NA 139 07/14/2022   K 3.7 07/14/2022   CL 110 07/14/2022   CO2 24 07/14/2022   BUN 10 07/14/2022   CREATININE 1.02 07/14/2022   GFRNONAA >60 07/14/2022   CALCIUM 9.1 07/14/2022   PROT 7.3 07/14/2022   ALBUMIN 2.9 (L) 07/14/2022   BILITOT 0.2 (L) 07/14/2022   ALKPHOS 45 07/14/2022   AST 16 07/14/2022   ALT 17 07/14/2022   ANIONGAP 5 07/14/2022   Lab Results  Component Value Date   CHOL 138 02/01/2022   HDL 31.90 (L) 02/01/2022   LDLCALC 79 07/03/2019   LDLDIRECT 71.0 02/01/2022   TRIG 337.0 (H) 02/01/2022   CHOLHDL 4 02/01/2022     Social History   Tobacco Use  Smoking Status Former  Smokeless Tobacco Former    Current Outpatient Medications on File Prior to Visit  Medication Sig Dispense Refill   atorvastatin (LIPITOR) 40 MG tablet TAKE 1 TABLET DAILY AT NIGHT 90 tablet 3   Continuous Blood Gluc Sensor (FREESTYLE LIBRE 3 SENSOR) MISC Apply 1 each topically every 14 (fourteen) days. Place 1 sensor on the skin every 14 days. Use to check glucose continuously 2  each 11   cyanocobalamin (VITAMIN B12) 1000 MCG/ML injection Inject 1 mL (1,000 mcg total) into the muscle every 30 (thirty) days. 3 mL 3   dicyclomine (BENTYL) 20 MG tablet Take 1 tablet (20 mg total) by mouth 4 (four) times daily -  before meals and at bedtime. 120 tablet 3   escitalopram (LEXAPRO) 20 MG tablet TAKE 1 TABLET DAILY 90 tablet 3   fenofibrate (TRICOR) 145 MG tablet Take 1 tablet (145 mg total) by mouth daily. D/c zetia 10 90 tablet 3   insulin degludec (TRESIBA) 100 UNIT/ML FlexTouch Pen Inject 5 Units into the skin daily. Increase by 1 unit each day until am blood sugar 90 to <140. Stop at 10 units max daily with food 9 mL 1   lisinopril-hydrochlorothiazide (ZESTORETIC) 20-12.5 MG tablet TAKE 2 TABLETS DAILY IN THE MORNING (INCREASED DOSE, DISCONTINUE PREVIOUS PRESCRIPTION) 180 tablet 3   No current facility-administered medications on file prior to visit.     ROS see history of present illness  Objective  Physical Exam Vitals:   12/31/22 0905  BP: 118/80  Pulse: (!) 105  Temp: 98.9 F (37.2 C)  SpO2: 99%    BP Readings from Last 3 Encounters:  12/31/22 118/80  07/28/22 133/67  07/14/22 (!) 135/96   Wt Readings from Last 3 Encounters:  12/31/22 213 lb 9.6 oz (96.9 kg)  07/14/22 201 lb (91.2 kg)  07/07/22 208 lb (94.3 kg)  Physical Exam Constitutional:      General: He is not in acute distress.    Appearance: Normal appearance.  HENT:     Head: Normocephalic.     Right Ear: Tympanic membrane normal.     Left Ear: Tympanic membrane normal.     Nose: Nose normal.     Mouth/Throat:     Mouth: Mucous membranes are moist.     Pharynx: Oropharynx is clear.  Eyes:     Pupils: Pupils are equal, round, and reactive to light.  Cardiovascular:     Rate and Rhythm: Normal rate and regular rhythm.     Heart sounds: Normal heart sounds.  Pulmonary:     Effort: Pulmonary effort is normal.     Breath sounds: Normal breath sounds.  Abdominal:     General:  Abdomen is flat. Bowel sounds are normal.     Palpations: Abdomen is soft.     Tenderness: There is no abdominal tenderness.  Musculoskeletal:        General: Normal range of motion.  Skin:    General: Skin is warm and dry.  Neurological:     Mental Status: He is alert.  Psychiatric:        Mood and Affect: Mood normal.        Behavior: Behavior normal.     Assessment/Plan: Please see individual problem list.  Type 2 diabetes mellitus without complication, with long-term current use of insulin ([place]) Assessment & Plan: Will check A1c today. Doing well on Tresiba and Metformin. Sugars 150-250s. Using [employer] sensor. Encouraged healthy diet and exercise. Will continue to monitor.   Orders: -     Hemoglobin A1c -     metFORMIN HCl ER; Take 2 tablets (1,000 mg total) by mouth 2 (two) times daily.  Dispense: 360 tablet; Refill: 3 -     Microalbumin / creatinine urine ratio  Hypertension associated with diabetes ([place]) Assessment & Plan: Stable on Lisinopril/HCTZ daily. Continue. Labs as outlined.   Orders: -     CBC with Differential/Platelet -     Comprehensive metabolic panel  Hyperlipidemia, unspecified hyperlipidemia type Assessment & Plan: Chronic. Stable on Lipitor and Fenofibrate daily. Will check lipids. Encouraged healthy diet and exercise.   Orders: -     Lipid panel  Anxiety and depression Assessment & Plan: Stable. Doing well on Lexapro and Ativan PRN. Patient reports not taking Ativan often, last fill in August 2023. Will refill today.   Orders: -     LORazepam; TAKE 1 TABLET(0.5 MG) BY MOUTH DAILY AS NEEDED FOR ANXIETY  Dispense: 30 tablet; Refill: 2  Vitamin D deficiency Assessment & Plan: Has been taking 50,000 IU weekly x 11 months. Will check level today. Advised patient to D/C weekly dosing and decrease to OTC daily dosing.   Orders: -     VITAMIN D 25 Hydroxy (Vit-D Deficiency, Fractures)  Vitamin B12 deficiency Assessment & Plan: Doing  well on at home injections once a month. Will check level today.   Orders: -     Vitamin B12  Thyroid disorder screening -     TSH   Return in about 6 months (around 07/01/2023).   Tomasita Morrow, NP-C [place]

## 2022-12-31 NOTE — Assessment & Plan Note (Signed)
Will check A1c today. Doing well on Tresiba and Metformin. Sugars 150-250s. Using [employer] sensor. Encouraged healthy diet and exercise. Will continue to monitor.

## 2022-12-31 NOTE — Assessment & Plan Note (Signed)
Doing well on at home injections once a month. Will check level today.

## 2022-12-31 NOTE — Assessment & Plan Note (Signed)
Stable. Doing well on Lexapro and Ativan PRN. Patient reports not taking Ativan often, last fill in August 2023. Will refill today.

## 2023-01-05 ENCOUNTER — Other Ambulatory Visit: Payer: Self-pay | Admitting: Nurse Practitioner

## 2023-01-05 DIAGNOSIS — Z794 Long term (current) use of insulin: Secondary | ICD-10-CM

## 2023-02-17 ENCOUNTER — Ambulatory Visit: Payer: Self-pay | Admitting: Nurse Practitioner

## 2023-02-18 ENCOUNTER — Telehealth: Payer: Self-pay

## 2023-02-18 NOTE — Transitions of Care (Post Inpatient/ED Visit) (Signed)
   02/18/2023  Name: Jorge Wright MRN: [id number] DOB: [birth year]  Today's TOC FU Call Status: Today's TOC FU Call Status:: Unsuccessul Call (1st Attempt) Unsuccessful Call (1st Attempt) Date: 02/18/23  Attempted to reach the patient regarding the most recent Inpatient/ED visit.  Follow Up Plan: Additional outreach attempts will be made to reach the patient to complete the Transitions of Care (Post Inpatient/ED visit) call.   Signature Ferne Reus, RN

## 2023-03-11 ENCOUNTER — Encounter: Payer: Self-pay | Admitting: Nurse Practitioner

## 2023-03-11 ENCOUNTER — Telehealth: Payer: Self-pay | Admitting: Nurse Practitioner

## 2023-03-11 DIAGNOSIS — E1165 Type 2 diabetes mellitus with hyperglycemia: Secondary | ICD-10-CM

## 2023-03-11 NOTE — Telephone Encounter (Signed)
I reordered the ref

## 2023-03-11 NOTE — Telephone Encounter (Signed)
Could you make a new referral for this pt? It looks like the referral team closed out the referral after the appt was made so we are not able to attach the referral. [facility]- thanks so much!

## 2023-03-15 ENCOUNTER — Other Ambulatory Visit: Payer: Self-pay

## 2023-03-15 DIAGNOSIS — F419 Anxiety disorder, unspecified: Secondary | ICD-10-CM

## 2023-03-18 ENCOUNTER — Telehealth: Payer: Self-pay | Admitting: Nurse Practitioner

## 2023-03-18 DIAGNOSIS — E119 Type 2 diabetes mellitus without complications: Secondary | ICD-10-CM

## 2023-03-18 MED ORDER — ATORVASTATIN CALCIUM 40 MG PO TABS: ORAL_TABLET | ORAL | 3 refills | Status: DC

## 2023-03-18 NOTE — Telephone Encounter (Signed)
Pt need a refill on atorvastatin up to three [facility] [phone]

## 2023-03-18 NOTE — Telephone Encounter (Signed)
sent 

## 2023-03-28 NOTE — Patient Instructions (Signed)

## 2023-03-29 ENCOUNTER — Ambulatory Visit (INDEPENDENT_AMBULATORY_CARE_PROVIDER_SITE_OTHER): Payer: 59 | Admitting: Nurse Practitioner

## 2023-03-29 ENCOUNTER — Encounter: Payer: Self-pay | Admitting: Nurse Practitioner

## 2023-03-29 VITALS — BP 126/83 | HR 65 | Ht 72.0 in | Wt 223.8 lb

## 2023-03-29 DIAGNOSIS — Z794 Long term (current) use of insulin: Secondary | ICD-10-CM | POA: Diagnosis not present

## 2023-03-29 DIAGNOSIS — E1165 Type 2 diabetes mellitus with hyperglycemia: Secondary | ICD-10-CM | POA: Diagnosis not present

## 2023-03-29 DIAGNOSIS — E119 Type 2 diabetes mellitus without complications: Secondary | ICD-10-CM

## 2023-03-29 MED ORDER — METFORMIN HCL ER 500 MG PO TB24: 1000.0000 mg | ORAL_TABLET | Freq: Every day | ORAL | 3 refills | Status: DC

## 2023-03-29 MED ORDER — INSULIN DEGLUDEC 100 UNIT/ML ~~LOC~~ SOPN
15.0000 [IU] | PEN_INJECTOR | Freq: Every day | SUBCUTANEOUS | 3 refills | Status: DC
Start: 2023-03-29 — End: 2023-11-14

## 2023-03-29 NOTE — Progress Notes (Signed)
Endocrinology Consult Note       03/29/2023, 10:50 AM   Subjective:    Patient ID: Jorge Wright, male    DOB: [birth year].  Jorge Wright is being seen in consultation for management of currently uncontrolled symptomatic diabetes requested by  Tomasita Morrow, NP.   Past Medical History:  Diagnosis Date   Chicken pox    COVID-19    12/2020 sob mild, 05/21/21   Depression    Diabetes mellitus without complication    History of alcohol abuse    Hyperlipidemia    Hypertension     Past Surgical History:  Procedure Laterality Date   COLONOSCOPY WITH PROPOFOL N/A 07/28/2022   Procedure: COLONOSCOPY WITH PROPOFOL;  Surgeon: Toledo, Benay Pike, MD;  Location: [facility];  Service: Gastroenterology;  Laterality: N/A;  OK'D PER PM   ESOPHAGOGASTRODUODENOSCOPY (EGD) WITH PROPOFOL N/A 07/28/2022   Procedure: ESOPHAGOGASTRODUODENOSCOPY (EGD) WITH PROPOFOL;  Surgeon: Toledo, Benay Pike, MD;  Location: [facility] END[facility]COPY;  Service: Gastroenterology;  Laterality: N/A;   MANDIBLE FRACTURE SURGERY      Social History   Socioeconomic History   Marital status: Married    Spouse name: Not on file   Number of children: Not on file   Years of education: Not on file   Highest education level: Not on file  Occupational History   Not on file  Tobacco Use   Smoking status: Former   Smokeless tobacco: Former  Substance and Sexual Activity   Alcohol use: Yes    Alcohol/week: 6.0 standard drinks of alcohol    Types: 6 Standard drinks or equivalent per week    Comment: occasional, last drink 1 month ago   Drug use: Yes    Types: Marijuana    Comment: occasional edibles   Sexual activity: Yes  Other Topics Concern   Not on file  Social History Narrative   Works IT    Married    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, dispensing    Social Determinants of Health   Financial Resource Strain: Low Risk  (12/22/2021)   Overall Financial Resource Strain  (CARDIA)    Difficulty of Paying Living Expenses: Not hard at all  Food Insecurity: Not on file  Transportation Needs: Not on file  Physical Activity: Not on file  Stress: Not on file  Social Connections: Not on file    Family History  Problem Relation Age of Onset   Alcohol abuse Mother    Hypertension Mother    Heart disease Mother        had heart attack 57    Diabetes Mother        type 2   Heart attack Mother 52   Alcohol abuse Father    Hypertension Father    Diabetes Father        type 1   Pancreatitis Father    Alcohol abuse Maternal Grandmother    Heart disease Maternal Grandmother        died in 103s    Hypertension Maternal Grandmother    Depression Maternal Grandmother    Stroke Paternal Grandfather    Heart attack Maternal Uncle        had heart attack  Outpatient Encounter Medications as of 03/29/2023  Medication Sig   atorvastatin (LIPITOR) 40 MG tablet TAKE 1 TABLET DAILY AT NIGHT   Continuous Blood Gluc Sensor (FREESTYLE LIBRE 3 SENSOR) MISC Apply 1 each topically every 14 (fourteen) days. Place 1 sensor on the skin every 14 days. Use to check glucose continuously   cyanocobalamin (VITAMIN B12) 1000 MCG/ML injection Inject 1 mL (1,000 mcg total) into the muscle every 30 (thirty) days.   escitalopram (LEXAPRO) 20 MG tablet TAKE 1 TABLET DAILY   fenofibrate (TRICOR) 145 MG tablet Take 1 tablet (145 mg total) by mouth daily. D/c zetia 10   lisinopril-hydrochlorothiazide (ZESTORETIC) 20-12.5 MG tablet TAKE 2 TABLETS DAILY IN THE MORNING (INCREASED DOSE, DISCONTINUE PREVIOUS PRESCRIPTION)   LORazepam (ATIVAN) 0.5 MG tablet TAKE 1 TABLET(0.5 MG) BY MOUTH DAILY AS NEEDED FOR ANXIETY   [DISCONTINUED] insulin degludec (TRESIBA) 100 UNIT/ML FlexTouch Pen Inject 5 Units into the skin daily. Increase by 1 unit each day until am blood sugar 90 to <140. Stop at 10 units max daily with food   dicyclomine (BENTYL) 20 MG tablet Take 1 tablet (20 mg total) by mouth 4 (four)  times daily -  before meals and at bedtime. (Patient not taking: Reported on 03/29/2023)   insulin degludec (TRESIBA) 100 UNIT/ML FlexTouch Pen Inject 15 Units into the skin at bedtime.   metFORMIN (GLUCOPHAGE-XR) 500 MG 24 hr tablet Take 2 tablets (1,000 mg total) by mouth daily with breakfast.   [DISCONTINUED] metFORMIN (GLUCOPHAGE-XR) 500 MG 24 hr tablet Take 2 tablets (1,000 mg total) by mouth 2 (two) times daily. (Patient not taking: Reported on 03/29/2023)   No facility-administered encounter medications on file as of 03/29/2023.    ALLERGIES: Allergies  Allergen Reactions   Mounjaro [Tirzepatide]     Injection site reaction    Ozempic (0.25 Or 0.5 Mg-Dose) [Semaglutide(0.25 Or 0.5mg -Dos)]     N/v/diarrhea/ab pain    Trulicity [Dulaglutide]     Gi Sxs   Invokana [Canagliflozin] Rash    Reports topical rash on trunk that occurred after taking Invokana for about a week. Resolved after medication discontinuation. No airway involvement.    Penicillins Rash    VACCINATION STATUS: Immunization History  Administered Date(s) Administered   Hepatitis A, Adult 01/10/2020, 07/08/2020   Influenza Inj Mdck Quad With Preservative 11/27/2017   Influenza,inj,Quad PF,6+ Mos 10/10/2014, 09/29/2016, 08/29/2018, 12/09/2020   Influenza-Unspecified 11/28/2012, 10/13/2015, 11/26/2017, 01/28/2022, 10/24/2022   PFIZER(Purple Top)SARS-COV-2 Vaccination 05/27/2020, 06/17/2020, 01/04/2021   Pneumococcal Conjugate-13 02/04/2022   Pneumococcal Polysaccharide-23 02/28/2013, 08/25/2014   Tdap 11/28/2012    Diabetes He presents for his initial diabetic visit. He has type 2 (negative GAD ab in 2023) diabetes mellitus. Onset time: diagnosed at age 71. Hypoglycemia symptoms include nervousness/anxiousness, sweats and tremors. Associated symptoms include fatigue. There are no hypoglycemic complications. Symptoms are stable. There are no diabetic complications. Risk factors for coronary artery disease include  diabetes mellitus, dyslipidemia, family history, male sex, sedentary lifestyle and tobacco exposure. Current diabetic treatment includes insulin injections. He is compliant with treatment most of the time. His weight is increasing steadily. He is following a generally unhealthy diet. When asked about meal planning, he reported none. He has not had a previous visit with a dietitian. He rarely participates in exercise. (He presents today for his consultation with his CGM on his phone (notes he has not worn his CGM in last 2 days).  His most recent A1c on 12/31/22 was 11.3%.  He endorses drinking mostly diet soda, coffee  with SF sweetener and creamer, and hot tea with SF sweetener.  He mostly eats snacks throughout the day, eating 1 larger meal.  He does not engage in routine physical activity.  He is UTD on eye exam, has not ever seen podiatry in the past.  He notes his glucose has been ranging between 120-240.) An ACE inhibitor/angiotensin II receptor blocker is being taken. He does not see a podiatrist.Eye exam is current.     Review of systems  Constitutional: + Minimally fluctuating body weight, current Body mass index is 30.35 kg/m., no fatigue, no subjective hyperthermia, no subjective hypothermia Eyes: no blurry vision, no xerophthalmia ENT: no sore throat, no nodules palpated in throat, no dysphagia/odynophagia, no hoarseness Cardiovascular: no chest pain, no shortness of breath, no palpitations, no leg swelling Respiratory: no cough, no shortness of breath Gastrointestinal: no nausea/vomiting/diarrhea Musculoskeletal: no muscle/joint aches Skin: no rashes, no hyperemia Neurological: no tremors, no numbness, no tingling, no dizziness Psychiatric: no depression, no anxiety  Objective:     BP 126/83 (BP Location: Right Arm, Patient Position: Sitting, Cuff Size: Large)   Pulse 65   Ht 6' (1.829 m)   Wt 223 lb 12.8 oz (101.5 kg)   BMI 30.35 kg/m   Wt Readings from Last 3 Encounters:   03/29/23 223 lb 12.8 oz (101.5 kg)  12/31/22 213 lb 9.6 oz (96.9 kg)  07/14/22 201 lb (91.2 kg)     BP Readings from Last 3 Encounters:  03/29/23 126/83  12/31/22 118/80  07/28/22 133/67     Physical Exam- Limited  Constitutional:  Body mass index is 30.35 kg/m. , not in acute distress, normal state of mind Eyes:  EOMI, no exophthalmos Neck: Supple Cardiovascular: RRR, no murmurs, rubs, or gallops, no edema Respiratory: Adequate breathing efforts, no crackles, rales, rhonchi, or wheezing Musculoskeletal: no gross deformities, strength intact in all four extremities, no gross restriction of joint movements Skin:  no rashes, no hyperemia Neurological: no tremor with outstretched hands    CMP ( most recent) CMP     Component Value Date/Time   NA 137 12/31/2022 0927   K 4.2 12/31/2022 0927   CL 99 12/31/2022 0927   CO2 28 12/31/2022 0927   GLUCOSE 197 (H) 12/31/2022 0927   BUN 17 12/31/2022 0927   CREATININE 1.13 12/31/2022 0927   CALCIUM 9.6 12/31/2022 0927   PROT 7.2 12/31/2022 0927   PROT 7.1 07/11/2017 0000   ALBUMIN 4.5 12/31/2022 0927   ALBUMIN 4.7 07/11/2017 0000   AST 15 12/31/2022 0927   ALT 17 12/31/2022 0927   ALKPHOS 56 12/31/2022 0927   BILITOT 0.5 12/31/2022 0927   BILITOT 0.2 07/11/2017 0000   GFRNONAA >60 07/14/2022 1418     Diabetic Labs (most recent): Lab Results  Component Value Date   HGBA1C 11.3 (H) 12/31/2022   HGBA1C 8.4 (H) 04/20/2022   HGBA1C 12.1 (H) 02/01/2022   MICROALBUR 1.3 12/31/2022   MICROALBUR 0.3 07/08/2020   MICROALBUR <0.7 08/29/2018     Lipid Panel ( most recent) Lipid Panel     Component Value Date/Time   CHOL 176 12/31/2022 0927   TRIG 90.0 12/31/2022 0927   HDL 46.60 12/31/2022 0927   CHOLHDL 4 12/31/2022 0927   VLDL 18.0 12/31/2022 0927   LDLCALC 111 (H) 12/31/2022 0927   LDLDIRECT 71.0 02/01/2022 0857      Lab Results  Component Value Date   TSH 1.25 12/31/2022   TSH 1.34 04/20/2022   TSH 1.23  07/31/2021  TSH 1.83 07/08/2020   TSH 1.53 03/24/2018           Assessment & Plan:   1) Type 2 diabetes mellitus with hyperglycemia, with long-term current use of insulin    - TOBY SELINSKY has currently uncontrolled symptomatic type 2 DM since 37 years of age, with most recent A1c of 11.3 %.   -Recent labs reviewed.  - I had a long discussion with him about the progressive nature of diabetes and the pathology behind its complications. 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 These are all discussed in detail with him.  The following Lifestyle Medicine recommendations according to [facility]) were discussed and offered to patient and he agrees to start the journey:  A. Whole Foods, Plant-based plate comprising of fruits and vegetables, plant-based proteins, whole-grain carbohydrates was discussed in detail with the patient.   A list for source of those nutrients were also provided to the patient.  Patient will use only water or unsweetened tea for hydration. B.  The need to stay away from risky substances including alcohol, smoking; obtaining 7 to 9 hours of restorative sleep, at least 150 minutes of moderate intensity exercise weekly, the importance of healthy social connections,  and stress reduction techniques were discussed. C.  A full color page of  Calorie density of various food groups per pound showing examples of each food groups was provided to the patient.  - I have counseled him on diet and weight management by adopting a carbohydrate restricted/protein rich diet. Patient is encouraged to switch to unprocessed or minimally processed complex starch and increased protein intake (animal or plant source), fruits, and vegetables. -  he is advised to stick to a routine mealtimes to eat  3 meals a day and avoid unnecessary snacks (to snack only to correct hypoglycemia).   - he acknowledges that there is a room for improvement in his food and drink choices. - Suggestion is made for him to avoid simple carbohydrates from his diet including Cakes, Sweet Desserts, Ice Cream, Soda (diet and regular), Sweet Tea, Candies, Chips, Cookies, Store Bought Juices, Alcohol in Excess of 1-2 drinks a day, Artificial Sweeteners, Coffee Creamer, and "Sugar-free" Products. This will help patient to have more stable blood glucose profile and potentially avoid unintended weight gain.  - he will be scheduled with Jearld Fenton, RDN, CDE for diabetes education.  - I have approached him with the following individualized plan to manage his diabetes and patient agrees:   -I advised him to adjust his Minh Aas to 15 units SQ nightly (had misunderstood directions and has been using sliding scale to dose it at meal times).  I also restarted him on Metformin 1000 mg ER daily with breakfast.  He will do best to avoid ETOH (recovering alcoholic).  He does not have the typical appearance of type 2 diabetic, may do additional testing in the future once glucose under better control.  -he is encouraged to continue monitoring glucose 4 times daily (using his CGM), before meals and before bed, to log their readings on the clinic sheets provided, and bring them to review at follow up appointment in 4 weeks.  - he is warned not to take insulin without proper monitoring per orders. - Adjustment parameters are given to him for hypo and hyperglycemia in writing. - he  is encouraged to call clinic for blood glucose levels less than 70 or above 300 mg /dl.  - he is not an ideal candidate for incretin therapy- has been on it in the past and did not feel well due to GI SE.  - Specific targets for  A1c; LDL, HDL, and Triglycerides were discussed with the patient.  2) Blood Pressure /Hypertension:  his blood pressure is  controlled to target.   he is advised to continue his current medications including Lisinopril HCT 20-12.5 mg p.o. daily with breakfast.  3) Lipids/Hyperlipidemia:    Review of his recent lipid panel from 12/31/22 showed uncontrolled LDL at 111 .  he is advised to continue Lipitor 40 mg daily at bedtime and Fenofibrate 145 mg po daily.  Side effects and precautions discussed with him.  4)  Weight/Diet:  his Body mass index is 30.35 kg/m.  -  clearly complicating his diabetes care.   he is a candidate for some weight loss. I discussed with him the fact that loss of 5 - 10% of his  current body weight will have the most impact on his diabetes management.  Exercise, and detailed carbohydrates information provided  -  detailed on discharge instructions.  5) Chronic Care/Health Maintenance: -he is on ACEI/ARB and Statin medications and is encouraged to initiate and continue to follow up with Ophthalmology, Dentist, Podiatrist at least yearly or according to recommendations, and advised to stay away from smoking. I have recommended yearly flu vaccine and pneumonia vaccine at least every 5 years; moderate intensity exercise for up to 150 minutes weekly; and sleep for at least 7 hours a day.  - he is advised to maintain close follow up with Tomasita Morrow, NP for primary care needs, as well as his other providers for optimal and coordinated care.   - Time spent in this patient care: 60 min, of which > 50% was spent in counseling him about his diabetes and the rest reviewing his blood glucose logs, discussing his hypoglycemia and hyperglycemia episodes, reviewing his current and previous labs/studies (including abstraction from other facilities) and medications doses and developing a long term treatment plan based on the latest standards of care/guidelines; and documenting his care.    Please refer to Patient Instructions for Blood Glucose Monitoring and Insulin/Medications Dosing Guide" in media tab for  additional information. Please also refer to "Patient Self Inventory" in the Media tab for reviewed elements of pertinent patient history.  Jorge Wright participated in the discussions, expressed understanding, and voiced agreement with the above plans.  All questions were answered to his satisfaction. he is encouraged to contact clinic should he have any questions or concerns prior to his return visit.     Follow up plan: - Return in about 4 weeks (around 04/26/2023) for Diabetes F/U with A1c in office, Bring meter and logs.    Rayetta Pigg, [facility] [address] [place], [place] [address] Phone: [phone] Fax: [phone]  03/29/2023, 10:50 AM

## 2023-05-04 ENCOUNTER — Ambulatory Visit: Payer: 59 | Admitting: Nurse Practitioner

## 2023-05-04 DIAGNOSIS — Z794 Long term (current) use of insulin: Secondary | ICD-10-CM

## 2023-07-01 ENCOUNTER — Ambulatory Visit: Payer: 59 | Admitting: Nurse Practitioner

## 2023-07-01 ENCOUNTER — Encounter: Payer: Self-pay | Admitting: Nurse Practitioner

## 2023-07-01 ENCOUNTER — Ambulatory Visit (INDEPENDENT_AMBULATORY_CARE_PROVIDER_SITE_OTHER): Payer: 59 | Admitting: Nurse Practitioner

## 2023-07-01 ENCOUNTER — Telehealth: Payer: Self-pay

## 2023-07-01 VITALS — BP 118/64 | HR 72 | Temp 98.0°F | Ht 72.0 in | Wt 214.4 lb

## 2023-07-01 DIAGNOSIS — E1165 Type 2 diabetes mellitus with hyperglycemia: Secondary | ICD-10-CM

## 2023-07-01 DIAGNOSIS — I152 Hypertension secondary to endocrine disorders: Secondary | ICD-10-CM

## 2023-07-01 DIAGNOSIS — E1159 Type 2 diabetes mellitus with other circulatory complications: Secondary | ICD-10-CM | POA: Diagnosis not present

## 2023-07-01 DIAGNOSIS — E782 Mixed hyperlipidemia: Secondary | ICD-10-CM

## 2023-07-01 DIAGNOSIS — Z794 Long term (current) use of insulin: Secondary | ICD-10-CM | POA: Diagnosis not present

## 2023-07-01 DIAGNOSIS — F1021 Alcohol dependence, in remission: Secondary | ICD-10-CM

## 2023-07-01 DIAGNOSIS — F419 Anxiety disorder, unspecified: Secondary | ICD-10-CM

## 2023-07-01 DIAGNOSIS — F32A Depression, unspecified: Secondary | ICD-10-CM

## 2023-07-01 LAB — POCT GLYCOSYLATED HEMOGLOBIN (HGB A1C)
HbA1c POC (<> result, manual entry): 8.5 %
HbA1c, POC (controlled diabetic range): 8.5 % — AB (ref 0.0–7.0)
HbA1c, POC (prediabetic range): 8.5 % — AB (ref 5.7–6.4)
Hemoglobin A1C: 8.5 % — AB (ref 4.0–5.6)

## 2023-07-01 MED ORDER — LORAZEPAM 0.5 MG PO TABS
ORAL_TABLET | ORAL | 2 refills | Status: DC
Start: 2023-07-01 — End: 2023-11-14

## 2023-07-01 MED ORDER — FENOFIBRATE 145 MG PO TABS: 145.0000 mg | ORAL_TABLET | Freq: Every day | ORAL | 3 refills | Status: DC

## 2023-07-01 MED ORDER — LISINOPRIL-HYDROCHLOROTHIAZIDE 20-12.5 MG PO TABS
ORAL_TABLET | ORAL | 3 refills | Status: DC
Start: 2023-07-01 — End: 2024-06-25

## 2023-07-01 MED ORDER — ESCITALOPRAM OXALATE 20 MG PO TABS: 20.0000 mg | ORAL_TABLET | Freq: Every day | ORAL | 3 refills | Status: DC

## 2023-07-01 NOTE — Assessment & Plan Note (Signed)
Chronic. Stable on Lipitor and Fenofibrate daily. Refills sent. Encouraged healthy diet and exercise.

## 2023-07-01 NOTE — Assessment & Plan Note (Signed)
Chronic. Was evaluated by Endocrinology in April. He has not been taking his [place] for several months. He has been taking one Metformin XR 500 mg tablet daily. He has stopped drinking alcohol and has been exercising 4-5 days per week. POCT A1c in office- 8.5. Improved from 11.3. Advised patient to take Metformin XR as prescribed, two tablets daily and to follow up with his Endocrinologist regarding use of Tresiba. He will continue healthy diet and exercise. Will continue to monitor.

## 2023-07-01 NOTE — Assessment & Plan Note (Signed)
Stable on Lisinopril/HCTZ daily. Continue. Refills sent.

## 2023-07-01 NOTE — Progress Notes (Signed)
Bethanie Dicker, NP-C Phone: [phone]  Jorge Wright is a 37 y.o. male who presents today for follow up.   HYPERTENSION Disease Monitoring: Blood pressure range- Not checking Chest pain- No      Dyspnea- No Medications: Compliance- Lisinopril-HCTZ Lightheadedness- No   Edema- No  Lab Results  Component Value Date   NA 137 12/31/2022   K 4.2 12/31/2022   CO2 28 12/31/2022   GLUCOSE 197 (H) 12/31/2022   BUN 17 12/31/2022   CREATININE 1.13 12/31/2022   CALCIUM 9.6 12/31/2022   GFRNONAA >60 07/14/2022    DIABETES- Patient has not been taking his [place] for the last several months. He has only been taking one Metformin daily. He has been exercising more and has stopped drinking alcohol.  Disease Monitoring: Blood Sugar ranges-150-170 Polyuria/phagia/dipsia- No      Optho- Yes Medications: Compliance- Metformin once daily Hypoglycemic symptoms- No  Lab Results  Component Value Date   HGBA1C 8.5 (A) 07/01/2023   HGBA1C 8.5 07/01/2023   HGBA1C 8.5 (A) 07/01/2023   HGBA1C 8.5 (A) 07/01/2023     HYPERLIPIDEMIA Disease Monitoring: See symptoms for Hypertension Medications: Compliance- Lipitor and Fenofibrate Right upper quadrant pain- No  Muscle aches- No  Lab Results  Component Value Date   CHOL 176 12/31/2022   HDL 46.60 12/31/2022   LDLCALC 111 (H) 12/31/2022   LDLDIRECT 71.0 02/01/2022   TRIG 90.0 12/31/2022   CHOLHDL 4 12/31/2022    Anxiety/Depression- Patient currently on Lexapro, Abilify, and Trazodone daily. He takes Ativan PRN. He currently sees 2 therapists but does not see Psychiatry. He was admitted into a mental healthy and rehab facility in February. He was started at that time on the Abilify and Trazodone. He is no longer drinking alcohol, he has been sober for 137 days. He is open to seeing a Psychiatrist for help with management of his medications.   Social History   Tobacco Use  Smoking Status Former  Smokeless Tobacco Former    Current  Outpatient Medications on File Prior to Visit  Medication Sig Dispense Refill   ARIPiprazole (ABILIFY) 10 MG tablet Take 10 mg by mouth at bedtime.     atorvastatin (LIPITOR) 40 MG tablet TAKE 1 TABLET DAILY AT NIGHT 90 tablet 3   Continuous Blood Gluc Sensor (FREESTYLE LIBRE 3 SENSOR) MISC Apply 1 each topically every 14 (fourteen) days. Place 1 sensor on the skin every 14 days. Use to check glucose continuously 2 each 11   hydrOXYzine (VISTARIL) 25 MG capsule SMARTSIG:1 Capsule(s) By Mouth Every 12 Hours PRN     metFORMIN (GLUCOPHAGE-XR) 500 MG 24 hr tablet Take 2 tablets (1,000 mg total) by mouth daily with breakfast. 180 tablet 3   traZODone (DESYREL) 50 MG tablet Take 50 mg by mouth at bedtime as needed.     insulin degludec (TRESIBA) 100 UNIT/ML FlexTouch Pen Inject 15 Units into the skin at bedtime. 12 mL 3   No current facility-administered medications on file prior to visit.    ROS see history of present illness  Objective  Physical Exam Vitals:   07/01/23 0851  BP: 118/64  Pulse: 72  Temp: 98 F (36.7 C)  SpO2: 98%    BP Readings from Last 3 Encounters:  07/01/23 118/64  03/29/23 126/83  12/31/22 118/80   Wt Readings from Last 3 Encounters:  07/01/23 214 lb 6.4 oz (97.3 kg)  03/29/23 223 lb 12.8 oz (101.5 kg)  12/31/22 213 lb 9.6 oz (96.9 kg)  Physical Exam Constitutional:      General: He is not in acute distress.    Appearance: Normal appearance.  HENT:     Head: Normocephalic.  Cardiovascular:     Rate and Rhythm: Normal rate and regular rhythm.     Heart sounds: Normal heart sounds.  Pulmonary:     Effort: Pulmonary effort is normal.     Breath sounds: Normal breath sounds.  Skin:    General: Skin is warm and dry.  Neurological:     General: No focal deficit present.     Mental Status: He is alert.  Psychiatric:        Mood and Affect: Mood normal.        Behavior: Behavior normal.    Assessment/Plan: Please see individual problem  list.  Hypertension associated with diabetes (HCC) Assessment & Plan: Stable on Lisinopril/HCTZ daily. Continue. Refills sent.   Orders: -     Lisinopril-hydroCHLOROthiazide; TAKE 2 TABLETS DAILY IN THE MORNING  Dispense: 180 tablet; Refill: 3  Type 2 diabetes mellitus with hyperglycemia, with long-term current use of insulin (HCC) Assessment & Plan: Chronic. Was evaluated by Endocrinology in April. He has not been taking his [place] for several months. He has been taking one Metformin XR 500 mg tablet daily. He has stopped drinking alcohol and has been exercising 4-5 days per week. POCT A1c in office- 8.5. Improved from 11.3. Advised patient to take Metformin XR as prescribed, two tablets daily and to follow up with his Endocrinologist regarding use of Tresiba. He will continue healthy diet and exercise. Will continue to monitor.   Orders: -     POCT glycosylated hemoglobin (Hb A1C)  Mixed hyperlipidemia Assessment & Plan: Chronic. Stable on Lipitor and Fenofibrate daily. Refills sent. Encouraged healthy diet and exercise.   Orders: -     Fenofibrate; Take 1 tablet (145 mg total) by mouth daily.  Dispense: 90 tablet; Refill: 3  Anxiety and depression Assessment & Plan: Chronic. Recent admission to inpatient facility. Reports doing well today. He will continue his current medication regimen. Refills provided on Ativan, only taking PRN, last fill in January 2024. He will continue seeing his 2 therapists as scheduled. Will refer to Psychiatry.   Orders: -     LORazepam; TAKE 1 TABLET(0.5 MG) BY MOUTH DAILY AS NEEDED FOR ANXIETY  Dispense: 30 tablet; Refill: 2 -     Ambulatory referral to Psychiatry -     Escitalopram Oxalate; Take 1 tablet (20 mg total) by mouth daily.  Dispense: 90 tablet; Refill: 3  Alcoholism in recovery [facility]) Assessment & Plan: Congratulated on 137 days of sobriety. Encouraged to continue abstaining from alcohol use.     Return in about 6 months (around  01/01/2024) for Follow up.   Bethanie Dicker, NP-C [facility] [employer]

## 2023-07-01 NOTE — Assessment & Plan Note (Signed)
Congratulated on 137 days of sobriety. Encouraged to continue abstaining from alcohol use.

## 2023-07-01 NOTE — Assessment & Plan Note (Signed)
Chronic. Recent admission to inpatient facility. Reports doing well today. He will continue his current medication regimen. Refills provided on Ativan, only taking PRN, last fill in January 2024. He will continue seeing his 2 therapists as scheduled. Will refer to Psychiatry.

## 2023-07-01 NOTE — Telephone Encounter (Signed)
Called pt to inform him that we need him to sign the Non-opioid contract for the medication LORazepam.    The form has been printed and placed upfront in the designated area. Pt stated he will be by later this evening to sign the contract.  Once pt signs contract forms can be uploaded to his chart.

## 2023-09-04 ENCOUNTER — Encounter: Payer: Self-pay | Admitting: Nurse Practitioner

## 2023-09-05 ENCOUNTER — Telehealth (HOSPITAL_COMMUNITY): Payer: Self-pay | Admitting: Licensed Clinical Social Worker

## 2023-09-05 NOTE — Telephone Encounter (Signed)
Patient just called. And wanted to know if it's a medication or something he can take to help him to stay sober and not drink. He sent a message on his MyChart. Could someone call him at [phone].

## 2023-09-05 NOTE — Telephone Encounter (Signed)
The therapist returns Jorge Wright's call confirming his identity via two identifiers. He has called to inquire about the [facility] IOP. He says that he was in [facility] in [place] in February for six days and then virtually attended their PHP forl six weeks. At some point after this, he complete the virtual, eight week [facility] program. He was attending some Twelve Step meetings at first but slacked off. He had 240 days of sobriety and was "doing good" but "fell off hard" last week drinking for several days. The therapist provides a description of the [facility] IOP and Jorge Wright says that he current is on Lorazepam which he takes p.r.n. started by his provider Ms. Bethanie Dicker, NP on 07/01/23 for panic attacks. He says that he had a prescription from a previous prescriber that he took a long time back and asked Ms. Konrad Dolores if she would renew it.  The therapist explains to Jorge Wright the reasons that benzodiazepines are contraindicated in persons with alcohol use disorders and recommends that he find a non-controlled option for addressing his anxiety.  He says that he is not sure that the [facility] IOP hours will work for his 8 to 5 p.m. job and liked doing virtual. Thus, the therapist provides him with the contact number of the [facility] that has evening hours.   He says that he will call them and will call this therapist back again on a p.r.n. basis.   Myrna Blazer, MA, LCSW, [facility], LCAS 09/05/2023

## 2023-10-17 ENCOUNTER — Encounter: Payer: Self-pay | Admitting: Psychiatry

## 2023-10-17 ENCOUNTER — Ambulatory Visit: Payer: 59 | Admitting: Psychiatry

## 2023-10-17 VITALS — BP 129/86 | HR 96 | Temp 97.2°F | Ht 72.0 in | Wt 207.0 lb

## 2023-10-17 DIAGNOSIS — F410 Panic disorder [episodic paroxysmal anxiety] without agoraphobia: Secondary | ICD-10-CM

## 2023-10-17 DIAGNOSIS — F1091 Alcohol use, unspecified, in remission: Secondary | ICD-10-CM | POA: Diagnosis not present

## 2023-10-17 DIAGNOSIS — F3181 Bipolar II disorder: Secondary | ICD-10-CM

## 2023-10-17 DIAGNOSIS — F1021 Alcohol dependence, in remission: Secondary | ICD-10-CM

## 2023-10-17 MED ORDER — HYDROXYZINE PAMOATE 25 MG PO CAPS
25.0000 mg | ORAL_CAPSULE | Freq: Four times a day (QID) | ORAL | 1 refills | Status: DC | PRN
Start: 2023-10-17 — End: 2024-09-27

## 2023-10-17 MED ORDER — ARIPIPRAZOLE 10 MG PO TABS
10.0000 mg | ORAL_TABLET | Freq: Every morning | ORAL | 0 refills | Status: DC
Start: 2023-10-17 — End: 2023-11-14

## 2023-10-17 MED ORDER — NALTREXONE HCL 50 MG PO TABS
25.0000 mg | ORAL_TABLET | Freq: Every day | ORAL | 0 refills | Status: DC
Start: 2023-10-17 — End: 2023-11-14

## 2023-10-17 NOTE — Patient Instructions (Addendum)
[email or web address]  www.[email or web address]  [email or web address] [facility] [address], [place], [place] [address]   [phone]  [facility] [email or web address] [address], [place], [place] [address]  [phone]  [facility] [email or web address] [address] [place], [place] [address]  [phone]   [facility] - [phone]  [facility] - [phone]  [facility] - [phone] counseling [phone]  [facility] [phone]   [email or web address] this clinician can offer telehealth and has a sliding scale option  [email or web address] this group also offers sliding scale rates and is based out of [place]  Dr. Liborio Nixon with the [facility] specializes in divorce  [employer] [facility] has interns who offer sliding scale rates and some of the full time clinicians do, as well. You complete their contact form on their website and the referrals coordinator will help to get connected to someone   Medicaid below :  [facility] [address] [place], [place] [address]  [phone]    [employer] [address] [place], [place] [address]  [phone]    [facility] [address] [place], [place] [address]  [phone]   Naltrexone Tablets What is this medication? NALTREXONE (nal TREX one) treats opioid use disorder. It works by blocking the effect of opioids and reducing cravings to use opioids. It may also be used to treat alcohol use disorder. It belongs to a group of medications called opioid blockers. It is most effective when used in combination with counseling and behavior therapy. This medicine may be used  for other purposes; ask your health care provider or pharmacist if you have questions. COMMON BRAND NAME(S): Depade, ReVia What should I tell my care team before I take this medication? They need to know if you have any of these conditions: Use of opioid-containing medications or alcohol within the past 7 to 10 days Kidney disease Liver disease An unusual or allergic reaction to naltrexone, other medications, foods, dyes, or preservatives Pregnant or trying to get pregnant Breast-feeding How should I use this medication? Take this medication by mouth with water. Take it as directed on the prescription label at the same time every day. Do not take this medication within 7 to 10 days of taking any opioid drugs. Keep taking it unless your care team tells you to stop. Talk to your care team about the use of this medication in children. Special care may be needed. Overdosage: If you think you have taken too much of this medicine contact a poison control center or emergency room at once. NOTE: This medicine is only for you. Do not share this medicine with others. What if I miss a dose? If you miss a dose and remember on the same day, take the missed dose. If you do not remember until the next day, ask your care team about rescheduling your doses. Do not take double or extra doses. What may interact with this medication? Do not take this medication with any of the following: Opioids, such as codeine, heroin, methadone This medication may also interact with the following: Disulfiram Thioridazine This list may not describe all possible interactions. Give your health care provider a list of all the medicines, herbs, non-prescription drugs, or dietary supplements you use. Also tell them if you smoke, drink alcohol, or use  illegal drugs. Some items may interact with your medicine. What should I watch for while using this medication? Your condition will be monitored carefully while you are receiving this  medication. Visit your care team for regular checks on your progress. For this medication to be most effective, you should attend any counseling or support groups that your care team recommends. Do not try to overcome the effects of the medication by taking large amounts of opioids or by drinking large amounts of alcohol. This can cause severe problems, including death. Also, you may be more sensitive to lower doses of opioids after you stop taking this medication. If you are going to have surgery, tell your care team that you are taking this medication. Do not treat yourself for coughs, colds, pain, or diarrhea. Ask your care team for advice. Some ingredients may interact with this medication and cause side effects. Wear a medical ID bracelet or chain. Carry a card that describes your condition. List the medications and doses you take on the card. This medication may affect your coordination, reaction time, or judgment. Do not drive or operate machinery until you know how this medication affects you. Sit up or stand slowly to reduce the risk of dizzy or fainting spells. Drinking alcohol with this medication can increase the risk of these side effects. What side effects may I notice from receiving this medication? Side effects that you should report to your care team as soon as possible: Allergic reactions--skin rash, itching, hives, swelling of the face, lips, tongue, or throat Liver injury--right upper belly pain, loss of appetite, nausea, light-colored stool, dark yellow or brown urine, yellowing skin or eyes, unusual weakness or fatigue Thoughts of suicide or self-harm, worsening mood, feelings of depression Side effects that usually do not require medical attention (report to your care team if they continue or are bothersome): Anxiety, nervousness Dizziness Headache Fatigue Nausea Trouble sleeping This list may not describe all possible side effects. Call your doctor for medical advice about  side effects. You may report side effects to FDA at 1-800-FDA-1088. Where should I keep my medication? Keep out of the reach of children and pets. Store at room temperature between 20 and 25 degrees C (68 and 77 degrees F). Get rid of any unused medication after the expiration date. To get rid of medications that are no longer needed or have expired: Take the medication to a medication take-back program. Check with your pharmacy or law enforcement to find a location. If you cannot return the medication, check the label or package insert to see if the medication should be thrown out in the garbage or flushed down the toilet. If you are not sure, ask your care team. If it is safe to put it in the trash, empty the medication out of the container. Mix the medication with cat litter, dirt, coffee grounds, or other unwanted substance. Seal the mixture in a bag or container. Put it in the trash. NOTE: This sheet is a summary. It may not cover all possible information. If you have questions about this medicine, talk to your doctor, pharmacist, or health care provider.  2024 Elsevier/Gold Standard (2022-02-22 00:00:00)
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
## 2023-10-18 ENCOUNTER — Encounter: Payer: Self-pay | Admitting: Psychiatry

## 2023-11-14 ENCOUNTER — Ambulatory Visit (INDEPENDENT_AMBULATORY_CARE_PROVIDER_SITE_OTHER): Payer: 59 | Admitting: Psychiatry

## 2023-11-14 ENCOUNTER — Encounter: Payer: Self-pay | Admitting: Psychiatry

## 2023-11-14 VITALS — BP 136/83 | HR 101 | Temp 97.5°F | Ht 72.0 in | Wt 208.2 lb

## 2023-11-14 DIAGNOSIS — F1021 Alcohol dependence, in remission: Secondary | ICD-10-CM

## 2023-11-14 DIAGNOSIS — F410 Panic disorder [episodic paroxysmal anxiety] without agoraphobia: Secondary | ICD-10-CM

## 2023-11-14 DIAGNOSIS — F3181 Bipolar II disorder: Secondary | ICD-10-CM | POA: Diagnosis not present

## 2023-11-14 DIAGNOSIS — R41840 Attention and concentration deficit: Secondary | ICD-10-CM | POA: Diagnosis not present

## 2023-11-14 MED ORDER — NALTREXONE HCL 50 MG PO TABS: 50.0000 mg | ORAL_TABLET | Freq: Every day | ORAL | 0 refills | Status: DC

## 2023-11-14 MED ORDER — ARIPIPRAZOLE 10 MG PO TABS
10.0000 mg | ORAL_TABLET | Freq: Every morning | ORAL | 0 refills | Status: DC
Start: 2023-11-14 — End: 2024-01-23

## 2023-11-14 NOTE — Progress Notes (Unsigned)
BH MD OP Progress Note  11/14/2023 12:09 PM Jorge Wright  MRN:  [id number]  Chief Complaint:  Chief Complaint  Patient presents with   Follow-up   Anxiety   Alcohol Problem   Attention and concentration deficit   HPI: Jorge Wright is a 37 year old Caucasian male, currently going through divorce, lives alone, employed, has a history of bipolar disorder type II, panic disorder, alcohol use disorder in early remission, attention and concentration deficit, diabetes mellitus, ulcerative colitis, hyperlipidemia was evaluated in office today.  Patient presented for follow-up.  Patient today reports he is currently doing better compared to how he was doing previously with regards to his mood symptoms.  The current medication regimen including the Abilify is keeping his mood symptoms stable.  Patient reports he is also self-aware of his triggers.  Whenever he knows he is going to have a ' low mood episode" he tries to find strategies to work through it.  He is currently tolerating the Abilify well.  Compliant on the Lexapro.  Does use the hydroxyzine couple of times daily.  Patient does report although he tries to focus on certain hobbies and strategies he feels bored too soon.  For example if he is doing video games he works on it for some time and then loses interest and is up and restless trying to find something else.  Patient reports he also has trouble with his focus at work.  Although work has been very supportive lately with everything going on in his life.  He may have been told by people around him he may have ADHD although never diagnosed officially.  Interested in referral for ADHD testing.  Patient reports he is going to have a change in position at work and that is hopefully something that will work better for him.  Patient reports sleep is overall good.  Denies any suicidality, homicidality or perceptual disturbances.  Currently staying away from alcohol use, however did  not get a chance to start using naltrexone.  Patient reports he did not have a pill cutter and hence could not the naltrexone 50 mg and a half.  Patient also reports he knew he needed to work on his triggers and he is currently doing well with that and has not had a lot of cravings.  However okay to start the naltrexone at 50 mg since holiday season is coming up.  Patient has been unable to find a therapist.  Provided resources again.  Patient denies any other concerns today.  Visit Diagnosis:    ICD-10-CM   1. Bipolar II disorder, mild, depressed, with anxious distress (HCC)  F31.81 ARIPiprazole (ABILIFY) 10 MG tablet    2. Panic disorder  F41.0 ARIPiprazole (ABILIFY) 10 MG tablet    3. Alcohol use disorder, severe, in early remission (HCC)  F10.21 naltrexone (DEPADE) 50 MG tablet    4. Attention and concentration deficit  R41.840 Ambulatory referral to Neuropsychology      Past Psychiatric History: I have reviewed past psychiatric history from progress note on 10/17/2023.  Past trials of medications like Wellbutrin, Lexapro, lorazepam.  Past Medical History:  Past Medical History:  Diagnosis Date   Chicken pox    COVID-19    12/2020 sob mild, 05/21/21   Depression    Diabetes mellitus without complication (HCC)    History of alcohol abuse    Hyperlipidemia    Hypertension    UC (ulcerative colitis) (HCC)     Past Surgical History:  Procedure Laterality  Date   COLONOSCOPY WITH PROPOFOL N/A 07/28/2022   Procedure: COLONOSCOPY WITH PROPOFOL;  Surgeon: Toledo, Boykin Nearing, MD;  Location: [facility] END[facility]COPY;  Service: Gastroenterology;  Laterality: N/A;  OK'D PER PM   ESOPHAGOGASTRODUODENOSCOPY (EGD) WITH PROPOFOL N/A 07/28/2022   Procedure: ESOPHAGOGASTRODUODENOSCOPY (EGD) WITH PROPOFOL;  Surgeon: Toledo, Boykin Nearing, MD;  Location: [facility] END[facility];  Service: Gastroenterology;  Laterality: N/A;   MANDIBLE FRACTURE SURGERY      Family Psychiatric History: I have reviewed family psychiatric  history from progress note on 10/17/2023.  Family History:  Family History  Problem Relation Age of Onset   Bipolar disorder Mother    Alcohol abuse Mother    Hypertension Mother    Heart disease Mother        had heart attack 61    Diabetes Mother        type 2   Heart attack Mother 2   Alcohol abuse Father    Hypertension Father    Diabetes Father        type 1   Pancreatitis Father    Heart attack Maternal Uncle        had heart attack    Alcohol abuse Maternal Grandmother    Heart disease Maternal Grandmother        died in 44s    Hypertension Maternal Grandmother    Depression Maternal Grandmother    Stroke Paternal Grandfather     Social History: I have reviewed social history from progress note on 10/17/2023. Social History   Socioeconomic History   Marital status: Legally Separated    Spouse name: Not on file   Number of children: 1   Years of education: Not on file   Highest education level: Bachelor's degree (e.g., BA, AB, BS)  Occupational History   Not on file  Tobacco Use   Smoking status: Former   Smokeless tobacco: Former  Building services engineer status: Never Used  Substance and Sexual Activity   Alcohol use: Yes    Alcohol/week: 6.0 standard drinks of alcohol    Types: 6 Standard drinks or equivalent per week    Comment: occasional, last drink 1 month ago, has a hx of alcoholism   Drug use: Not Currently    Types: Marijuana    Comment: occasional edibles   Sexual activity: Yes  Other Topics Concern   Not on file  Social History Narrative   Works IT    separated   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    Social Determinants of Health   Financial Resource Strain: Low Risk  (12/22/2021)   Overall Financial Resource Strain (CARDIA)    Difficulty of Paying Living Expenses: Not hard at all  Food Insecurity: Not on file  Transportation Needs: Not on file  Physical Activity: Not on file  Stress: Not on file  Social Connections: Not on file    Allergies:   Allergies  Allergen Reactions   Mounjaro [Tirzepatide]     Injection site reaction    Ozempic (0.25 Or 0.5 Mg-Dose) [Semaglutide(0.25 Or 0.5mg -Dos)]     N/v/diarrhea/ab pain    Trulicity [Dulaglutide]     Gi Sxs   Invokana [Canagliflozin] Rash    Reports topical rash on trunk that occurred after taking Invokana for about a week. Resolved after medication discontinuation. No airway involvement.    Penicillins Rash    Metabolic Disorder Labs: Lab Results  Component Value Date   HGBA1C 8.5 (A) 07/01/2023   HGBA1C 8.5 07/01/2023   HGBA1C 8.5 (  A) 07/01/2023   HGBA1C 8.5 (A) 07/01/2023   No results found for: "PROLACTIN" Lab Results  Component Value Date   CHOL 176 12/31/2022   TRIG 90.0 12/31/2022   HDL 46.60 12/31/2022   CHOLHDL 4 12/31/2022   VLDL 18.0 12/31/2022   LDLCALC 111 (H) 12/31/2022   LDLCALC 79 07/03/2019   Lab Results  Component Value Date   TSH 1.25 12/31/2022   TSH 1.34 04/20/2022    Therapeutic Level Labs: No results found for: "LITHIUM" No results found for: "VALPROATE" No results found for: "CBMZ"  Current Medications: Current Outpatient Medications  Medication Sig Dispense Refill   atorvastatin (LIPITOR) 40 MG tablet TAKE 1 TABLET DAILY AT NIGHT 90 tablet 3   Continuous Blood Gluc Sensor (FREESTYLE LIBRE 3 SENSOR) MISC Apply 1 each topically every 14 (fourteen) days. Place 1 sensor on the skin every 14 days. Use to check glucose continuously 2 each 11   escitalopram (LEXAPRO) 20 MG tablet Take 1 tablet (20 mg total) by mouth daily. 90 tablet 3   fenofibrate (TRICOR) 145 MG tablet Take 1 tablet (145 mg total) by mouth daily. 90 tablet 3   hydrOXYzine (VISTARIL) 25 MG capsule Take 1 capsule (25 mg total) by mouth 4 (four) times daily as needed. For severe anxiety 120 capsule 1   lisinopril-hydrochlorothiazide (ZESTORETIC) 20-12.5 MG tablet TAKE 2 TABLETS DAILY IN THE MORNING 180 tablet 3   metFORMIN (GLUCOPHAGE-XR) 500 MG 24 hr tablet Take 2 tablets  (1,000 mg total) by mouth daily with breakfast. 180 tablet 3   traZODone (DESYREL) 50 MG tablet Take 50 mg by mouth at bedtime as needed.     ARIPiprazole (ABILIFY) 10 MG tablet Take 1 tablet (10 mg total) by mouth in the morning. 90 tablet 0   naltrexone (DEPADE) 50 MG tablet Take 1 tablet (50 mg total) by mouth daily. 90 tablet 0   No current facility-administered medications for this visit.     Musculoskeletal: Strength & Muscle Tone: within normal limits Gait & Station: normal Patient leans: N/A  Psychiatric Specialty Exam: Review of Systems  Psychiatric/Behavioral:  The patient is nervous/anxious.     Blood pressure 136/83, pulse (!) 101, temperature (!) 97.5 F (36.4 C), temperature source Skin, height 6' (1.829 m), weight 208 lb 3.2 oz (94.4 kg).Body mass index is 28.24 kg/m.  General Appearance: Fairly Groomed  Eye Contact:  Fair  Speech:  Clear and Coherent  Volume:  Normal  Mood:  Anxious  Affect:  Full Range  Thought Process:  Goal Directed and Descriptions of Associations: Intact  Orientation:  Full (Time, Place, and Person)  Thought Content: Logical   Suicidal Thoughts:  No  Homicidal Thoughts:  No  Memory:  Immediate;   Fair Recent;   Fair Remote;   Fair  Judgement:  Fair  Insight:  Fair  Psychomotor Activity:  Normal  Concentration:  Concentration: Fair and Attention Span: Fair  Recall:  [employer] of Knowledge: Fair  Language: Fair  Akathisia:  No  Handed:  Right  AIMS (if indicated): done  Assets:  Communication Skills Desire for Improvement Housing Social Support Talents/Skills Transportation  ADL's:  Intact  Cognition: WNL  Sleep:  Fair   Screenings: GAD-7    Garment/textile technologist Visit from 10/17/2023 in [facility] Office Visit from 07/01/2023 in [facility] Visit from 12/31/2022 in [facility] Visit from  07/31/2021 in [facility] [employer] Visit from 07/08/2020 in [facility]  Total GAD-7 Score 21 19 10 7 21       PHQ2-9    Flowsheet Row Office Visit from 10/17/2023 in [facility] Office Visit from 07/01/2023 in [facility] Visit from 12[facility]31/2022 in [facility] Visit from 04/27/2022 in [facility] [employer] Visit from 04/20/2022 in [facility] [employer]  PHQ-2 Total Score 4 4 1  0 1  PHQ-9 Total Score 21 21 8  -- --      Flowsheet Row Office Visit from 10/17/2023 in [facility] Admission (Discharged) from 07/28/2022 in [facility] ED from 07/14/2022 in [facility] Department at [facility]  C-SSRS RISK CATEGORY No Risk No Risk No Risk        Assessment and Plan: DEATON BYNES is a 37 year old Caucasian male who has a history of multiple medical problems, bipolar disorder, panic disorder, alcohol use disorder, currently improving although continues to have attention and focus problems, will benefit from referral for ADHD testing as well as CBT, plan as noted below.  Plan Bipolar disorder type II mild depressed with anxious distress in partial remission Abilify 10 mg p.o. daily in the morning We will consider addition of a mood stabilizer like Depakote/Lamictal/gabapentin in the future   Panic disorder-improving Lexapro 20 mg p.o. daily Hydroxyzine 25 mg 4 times a day as needed for severe anxiety attacks Referred for CBT-provided resources in the community.  Alcohol use disorder in early remission-will continue to monitor Change Naltrexone 50 mg p.o. daily.    Collaboration of Care: Collaboration of Care: Referral or follow-up  with counselor/therapist AEB patient encouraged to establish care with therapist.  Patient/Guardian was advised Release of Information must be obtained prior to any record release in order to collaborate their care with an outside provider. Patient/Guardian was advised if they have not already done so to contact the registration department to sign all necessary forms in order for [place] to release information regarding their care.   Consent: Patient/Guardian gives verbal consent for treatment and assignment of benefits for services provided during this visit. Patient/Guardian expressed understanding and agreed to proceed.   Follow-up in clinic in 8 weeks or sooner if needed.  This note was generated in part or whole with voice recognition software. Voice recognition is usually quite accurate but there are transcription errors that can and very often do occur. I apologize for any typographical errors that were not detected and corrected.      Jomarie Longs, MD 11/14/2023, 12:09 PM

## 2023-12-06 LAB — HM DIABETES EYE EXAM

## 2023-12-14 NOTE — Telephone Encounter (Signed)
Results abstracted and Care Team updated

## 2024-01-16 ENCOUNTER — Ambulatory Visit: Payer: 59 | Admitting: Psychiatry

## 2024-01-23 ENCOUNTER — Encounter: Payer: Self-pay | Admitting: Psychiatry

## 2024-01-23 ENCOUNTER — Encounter: Payer: Self-pay | Admitting: Psychology

## 2024-01-23 ENCOUNTER — Ambulatory Visit (INDEPENDENT_AMBULATORY_CARE_PROVIDER_SITE_OTHER): Payer: 59 | Admitting: Psychiatry

## 2024-01-23 VITALS — BP 110/62 | HR 91 | Temp 98.0°F | Ht 72.0 in | Wt 212.4 lb

## 2024-01-23 DIAGNOSIS — F1021 Alcohol dependence, in remission: Secondary | ICD-10-CM | POA: Diagnosis not present

## 2024-01-23 DIAGNOSIS — G4700 Insomnia, unspecified: Secondary | ICD-10-CM

## 2024-01-23 DIAGNOSIS — F3181 Bipolar II disorder: Secondary | ICD-10-CM | POA: Diagnosis not present

## 2024-01-23 DIAGNOSIS — R41840 Attention and concentration deficit: Secondary | ICD-10-CM | POA: Diagnosis not present

## 2024-01-23 DIAGNOSIS — F410 Panic disorder [episodic paroxysmal anxiety] without agoraphobia: Secondary | ICD-10-CM

## 2024-01-23 MED ORDER — ARIPIPRAZOLE 10 MG PO TABS: 10.0000 mg | ORAL_TABLET | Freq: Every morning | ORAL | 1 refills | Status: DC

## 2024-01-23 MED ORDER — RAMELTEON 8 MG PO TABS: 8.0000 mg | ORAL_TABLET | Freq: Every day | ORAL | 1 refills | Status: DC

## 2024-01-23 NOTE — Patient Instructions (Signed)
Ramelteon Tablets What is this medication? RAMELTEON (ram EL tee on) treats insomnia. It helps you go to sleep faster. This medicine may be used for other purposes; ask your health care provider or pharmacist if you have questions. COMMON BRAND NAME(S): Rozerem What should I tell my care team before I take this medication? They need to know if you have any of these conditions: Liver disease Lung or breathing disease, such as asthma or COPD Mental health condition Substance use disorder Sleep apnea Suicidal thoughts, plans, or attempt by you or a family member An unusual or allergic reaction to ramelteon, other medications, foods, dyes, or preservatives Pregnant or trying to get pregnant Breast-feeding How should I use this medication? Take this medication by mouth with water. Take it as directed on the prescription label and only when you are ready for bed. Do not cut or break this medication. Swallow the tablets whole. Do not take it with or right after a meal. A special MedGuide will be given to you by the pharmacist with each prescription and refill. Be sure to read this information carefully each time. Talk to your care team about the use of this medication in children. It is not approved for use in children. Overdosage: If you think you have taken too much of this medicine contact a poison control center or emergency room at once. NOTE: This medicine is only for you. Do not share this medicine with others. What if I miss a dose? This does not apply. This medication should only be taken immediately before going to sleep. Do not take double or extra doses. What may interact with this medication? Do not take this medication with any of the following: Fluvoxamine Melatonin Tasimelteon Viloxazine This medication may also interact with the following: Alcohol Certain medications for fungal infections, such as ketoconazole, fluconazole,  itraconazole Ciprofloxacin Donepezil Doxepin Other medications for sleep Rifampin This list may not describe all possible interactions. Give your health care provider a list of all the medicines, herbs, non-prescription drugs, or dietary supplements you use. Also tell them if you smoke, drink alcohol, or use illegal drugs. Some items may interact with your medicine. What should I watch for while using this medication? Visit your care team for regular checks on your progress. Tell your care team if your symptoms do not start to get better or if they get worse. Plan to go to bed and stay in bed for a full night (7 to 8 hours) after you take this medication. You may still be drowsy the morning after taking this medication. This medication may affect your coordination, reaction time, or judgment. Do not drive or operate machinery until you know how this medication affects you. Sit up or stand slowly to reduce the risk of dizzy or fainting spells. You may do unusual sleep behaviors or activities you do not remember the day after taking this medication. Activities include driving, making or eating food, talking on the phone, sexual activity, or sleep walking. Stop taking this medication and call your care team right away if you find out you have done activities like this. If you or your family notice any changes in your behavior, such as new or worsening depression, thoughts of harming yourself, anxiety, other unusual or disturbing thoughts, or memory loss, call your care team right away. After you stop taking this medication, you may have trouble falling asleep. This is called rebound insomnia. This problem usually goes away on its own after 1 or 2 nights. What  side effects may I notice from receiving this medication? Side effects that you should report to your care team as soon as possible: Allergic reactions or angioedema--skin rash, itching, hives, swelling of the face, lips, tongue, arms, or legs,  trouble swallowing or breathing High prolactin level--unexpected breast tissue growth, discharge from the nipple, change in sex drive or performance, irregular menstrual cycle Mood and behavior changes--anxiety, nervousness, confusion, hallucinations, irritability, hostility, thoughts of suicide or self-harm, worsening mood, feelings of depression Unusual sleep behaviors or activities you do not remember such as driving, eating, or sexual activity Side effects that usually do not require medical attention (report to your care team if they continue or are bothersome): Dizziness Drowsiness the day after use Fatigue This list may not describe all possible side effects. Call your doctor for medical advice about side effects. You may report side effects to FDA at 1-800-FDA-1088. Where should I keep my medication? Keep out of the reach of children and pets. Store at room temperature between 15 and 30 degrees C (59 and 86 degrees F). Protect from light and moisture. Keep the container tightly closed. Get rid of any unused medication after the expiration date. To get rid of medications that are no longer needed or have expired: Take the medication to a medication take-back program. Check with your pharmacy or law enforcement to find a location. If you cannot return the medication, check the label or package insert to see if the medication should be thrown out in the garbage or flushed down the toilet. If you are not sure, ask your care team. If it is safe to put it in the trash, take the medication out of the container. Mix the medication with cat litter, dirt, coffee grounds, or other unwanted substance. Seal the mixture in a bag or container. Put it in the trash. NOTE: This sheet is a summary. It may not cover all possible information. If you have questions about this medicine, talk to your doctor, pharmacist, or health care provider.  2024 Elsevier/Gold Standard (2023-07-01 00:00:00)

## 2024-01-23 NOTE — Progress Notes (Signed)
BH MD OP Progress Note  01/23/2024 9:16 AM Jorge Wright  MRN:  [id number]  Chief Complaint:  Chief Complaint  Patient presents with   Follow-up   Anxiety   Depression   Insomnia   Medication Refill   HPI: Jorge Wright is a 38 year old Caucasian male, lives alone, employed, going through divorce, has a history of bipolar disorder type II, panic disorder, alcohol use disorder in remission, partial, attention concentration deficit, diabetes mellitus, ulcerative colitis, hyperlipidemia was evaluated in office today.  The patient presents for a follow-up visit. He reports that his mood has been relatively stable with the use of Abilify, noting a reduction in extreme mood swings. However, he still experiences periods of lack of interest and motivation, describing a struggle to stay on task and a pervasive sense of nihilism. He has recently started therapy and has had two to three sessions, which he reports are going well.  The patient also reports sleep disturbances, with difficulty both falling asleep and staying asleep. He describes a pattern of sleeping for about five hours a night, from 9 PM to 2 AM, and occasionally staying awake for up to 36 hours. Despite this, he does not feel excessively tired during the day and does not feel the need for daily naps. He has tried melatonin in the past with no noticeable improvement in sleep.  Does have trazodone available however has not been using it.  Does not believe it is beneficial.  The patient also mentions experiencing anxiety related to work events, specifically a recent sales event. He reports no changes to his medical history or medication regimen, with the exception of discontinuing naltrexone two weeks prior to the visit. Patient denies occasional use of alcohol, socially especially since it was the holiday season.  Denies any cravings for alcohol at this time.  Regarding his ulcerative colitis, the patient reports being prescribed a  new medication but has not started taking it due to concerns about the dosage and pill size. He expresses a desire to explore other treatment options for this condition.   Patient currently denies any suicidality, homicidality or perceptual disturbances.  Visit Diagnosis:    ICD-10-CM   1. Bipolar II disorder, mild, depressed, with anxious distress (HCC)  F31.81 ARIPiprazole (ABILIFY) 10 MG tablet    2. Panic disorder  F41.0 ARIPiprazole (ABILIFY) 10 MG tablet    3. Alcohol use disorder, severe, in early remission (HCC)  F10.21     4. Attention and concentration deficit  R41.840     5. Insomnia, unspecified type  G47.00 ramelteon (ROZEREM) 8 MG tablet      Past Psychiatric History: I have reviewed past psychiatric history from progress note on 10/17/2023.  Past trials of medications like Wellbutrin, Lexapro, lorazepam, trazodone.  Past Medical History:  Past Medical History:  Diagnosis Date   Chicken pox    COVID-19    12/2020 sob mild, 05/21/21   Depression    Diabetes mellitus without complication (HCC)    History of alcohol abuse    Hyperlipidemia    Hypertension    UC (ulcerative colitis) (HCC)     Past Surgical History:  Procedure Laterality Date   COLONOSCOPY WITH PROPOFOL N/A 07/28/2022   Procedure: COLONOSCOPY WITH PROPOFOL;  Surgeon: Toledo, Boykin Nearing, MD;  Location: [facility];  Service: Gastroenterology;  Laterality: N/A;  OK'D PER PM   ESOPHAGOGASTRODUODENOSCOPY (EGD) WITH PROPOFOL N/A 07/28/2022   Procedure: ESOPHAGOGASTRODUODENOSCOPY (EGD) WITH PROPOFOL;  Surgeon: Norma Fredrickson, Boykin Nearing, MD;  Location:  [facility];  Service: Gastroenterology;  Laterality: N/A;   MANDIBLE FRACTURE SURGERY      Family Psychiatric History: I have reviewed family psychiatric history from progress note on 10/17/2023.  Family History:  Family History  Problem Relation Age of Onset   Bipolar disorder Mother    Alcohol abuse Mother    Hypertension Mother    Heart disease Mother         had heart attack 15    Diabetes Mother        type 2   Heart attack Mother 23   Alcohol abuse Father    Hypertension Father    Diabetes Father        type 1   Pancreatitis Father    Heart attack Maternal Uncle        had heart attack    Alcohol abuse Maternal Grandmother    Heart disease Maternal Grandmother        died in 48s    Hypertension Maternal Grandmother    Depression Maternal Grandmother    Stroke Paternal Grandfather     Social History: I have reviewed social history from progress note on 10/17/2023. Social History   Socioeconomic History   Marital status: Legally Separated    Spouse name: Not on file   Number of children: 1   Years of education: Not on file   Highest education level: Bachelor's degree (e.g., BA, AB, BS)  Occupational History   Not on file  Tobacco Use   Smoking status: Former   Smokeless tobacco: Former  Building services engineer status: Every Day   Substances: Nicotine  Substance and Sexual Activity   Alcohol use: Yes    Alcohol/week: 6.0 standard drinks of alcohol    Types: 6 Standard drinks or equivalent per week    Comment: occasional, last drink 1 month ago, has a hx of alcoholism   Drug use: Not Currently    Types: Marijuana    Comment: occasional edibles   Sexual activity: Yes  Other Topics Concern   Not on file  Social History Narrative   Works IT    separated   Kids daughter Venia Minks    Social Drivers of Health   Financial Resource Strain: Low Risk  (12/22/2021)   Overall Financial Resource Strain (CARDIA)    Difficulty of Paying Living Expenses: Not hard at all  Food Insecurity: Not on file  Transportation Needs: Not on file  Physical Activity: Not on file  Stress: Not on file  Social Connections: Not on file    Allergies:  Allergies  Allergen Reactions   Mounjaro [Tirzepatide]     Injection site reaction    Ozempic (0.25 Or 0.5 Mg-Dose) [Semaglutide(0.25 Or 0.5mg -Dos)]     N/v/diarrhea/ab pain    Trulicity  [Dulaglutide]     Gi Sxs   Invokana [Canagliflozin] Rash    Reports topical rash on trunk that occurred after taking Invokana for about a week. Resolved after medication discontinuation. No airway involvement.    Penicillins Rash    Metabolic Disorder Labs: Lab Results  Component Value Date   HGBA1C 8.5 (A) 07/01/2023   HGBA1C 8.5 07/01/2023   HGBA1C 8.5 (A) 07/01/2023   HGBA1C 8.5 (A) 07/01/2023   No results found for: "PROLACTIN" Lab Results  Component Value Date   CHOL 176 12/31/2022   TRIG 90.0 12/31/2022   HDL 46.60 12/31/2022   CHOLHDL 4 12/31/2022   VLDL 18.0 12/31/2022   LDLCALC 111 (H) 12/31/2022  LDLCALC 79 07/03/2019   Lab Results  Component Value Date   TSH 1.25 12/31/2022   TSH 1.34 04/20/2022    Therapeutic Level Labs: No results found for: "LITHIUM" No results found for: "VALPROATE" No results found for: "CBMZ"  Current Medications: Current Outpatient Medications  Medication Sig Dispense Refill   atorvastatin (LIPITOR) 40 MG tablet TAKE 1 TABLET DAILY AT NIGHT 90 tablet 3   cephALEXin (KEFLEX) 500 MG capsule Take 500 mg by mouth 2 (two) times daily.     Continuous Blood Gluc Sensor (FREESTYLE LIBRE 3 SENSOR) MISC Apply 1 each topically every 14 (fourteen) days. Place 1 sensor on the skin every 14 days. Use to check glucose continuously 2 each 11   escitalopram (LEXAPRO) 20 MG tablet Take 1 tablet (20 mg total) by mouth daily. 90 tablet 3   fenofibrate (TRICOR) 145 MG tablet Take 1 tablet (145 mg total) by mouth daily. 90 tablet 3   hydrOXYzine (VISTARIL) 25 MG capsule Take 1 capsule (25 mg total) by mouth 4 (four) times daily as needed. For severe anxiety 120 capsule 1   lisinopril-hydrochlorothiazide (ZESTORETIC) 20-12.5 MG tablet TAKE 2 TABLETS DAILY IN THE MORNING 180 tablet 3   metFORMIN (GLUCOPHAGE-XR) 500 MG 24 hr tablet Take 2 tablets (1,000 mg total) by mouth daily with breakfast. 180 tablet 3   ramelteon (ROZEREM) 8 MG tablet Take 1 tablet (8  mg total) by mouth at bedtime. 30 tablet 1   ARIPiprazole (ABILIFY) 10 MG tablet Take 1 tablet (10 mg total) by mouth in the morning. 90 tablet 1   balsalazide (COLAZAL) 750 MG capsule Take by mouth. (Patient not taking: Reported on 01/23/2024)     No current facility-administered medications for this visit.     Musculoskeletal: Strength & Muscle Tone: within normal limits Gait & Station: normal Patient leans: N/A  Psychiatric Specialty Exam: Review of Systems  Psychiatric/Behavioral:  Positive for decreased concentration and sleep disturbance. The patient is nervous/anxious.     Blood pressure 110/62, pulse 91, temperature 98 F (36.7 C), temperature source Temporal, height 6' (1.829 m), weight 212 lb 6.4 oz (96.3 kg), SpO2 100%.Body mass index is 28.81 kg/m.  General Appearance: Casual  Eye Contact:  Fair  Speech:  Clear and Coherent  Volume:  Normal  Mood:  Anxious  Affect:  Appropriate  Thought Process:  Goal Directed and Descriptions of Associations: Intact  Orientation:  Full (Time, Place, and Person)  Thought Content: Logical   Suicidal Thoughts:  No  Homicidal Thoughts:  No  Memory:  Immediate;   Fair Recent;   Fair Remote;   Fair  Judgement:  Fair  Insight:  Fair  Psychomotor Activity:  Normal  Concentration:  Concentration: Fair and Attention Span: Fair  Recall:  [employer] of Knowledge: Fair  Language: Fair  Akathisia:  No  Handed:  Right  AIMS (if indicated): done  Assets:  Desire for Improvement Housing Social Support  ADL's:  Intact  Cognition: WNL  Sleep:  Poor   Screenings: AIMS    Flowsheet Row Office Visit from 11/14/2023 in [facility]  AIMS Total Score 0      GAD-7    Flowsheet Row Office Visit from 11/14/2023 in [facility] Office Visit from 10/17/2023 in [facility] Office Visit from 07/01/2023 in [facility] [employer] Visit from 12/31/2022 in [facility] [employer] Visit  from 07/31/2021 in [facility]  Total GAD-7 Score 17 21 19 10 7       PHQ2-9    Flowsheet Row Office Visit from 11/14/2023 in [facility] Office Visit from 10/17/2023 in [facility] Office Visit from 07/01/2023 in [facility] Visit from 12/31/2022 in [facility] [employer] Visit from 04/27/2022 in [facility] [employer] [facility]  PHQ-2 Total Score 2 4 4 1  0  PHQ-9 Total Score 9 21 21 8  --      Flowsheet Row Office Visit from 01/23/2024 in [facility] Office Visit from 11/14/2023 in [facility] Office Visit from 10/17/2023 in [facility]  C-SSRS RISK CATEGORY No Risk No Risk No Risk        Assessment and Plan: ARMEL RABBANI is a 38 year old Caucasian male who has a history of multiple medical problems including bipolar disorder, panic disorder, alcohol use disorder, currently stable on current medication regimen with regards to mood symptoms however continues to struggle with attention, sleep problems, discussed assessment and plan as noted below.  Bipolar disorder type II mild depressed in remission Abilify has stabilized mood swings, but lack of interest and motivation persists more than half the time. Started therapy with Inetta Fermo at [facility], working on nihilistic thoughts. Sleep issues have improved but still occur once or twice a month. He feels tired and has low energy more than half the time. Anxiety has increased recently due to a work-related sales event. Discussed Rozerem (ramelteon) for sleep, including potential  side effects such as grogginess, drowsiness, sleepwalking, vivid dreams, and hallucinations.   - Continue Abilify 10 mg   - Continue therapy with Inetta Fermo   - Prescribe Rozerem 8 mg for sleep issues   - Work on sleep hygiene   - Monitor for side effects of Abilify, including parkinsonian symptoms and involuntary movements   - Follow up in three months, next appointment can be by video    Panic disorder-stable Increased anxiety over the past few weeks due to a work-related sales event. He experiences intrusive thoughts and catastrophic thinking. Working on these issues with his therapist.  - Lexapro 20 mg daily - Hydroxyzine 25 mg 4 times a day as needed. - Continue therapy with Felecia Jan - Monitor anxiety levels and adjust treatment as needed    Insomnia-unstable Patient with sleep problems, difficulty staying asleep mostly as well as episodic inability to sleep all night a couple of times a month.  It used to be couple of times a week previously although that has improved.  Trazodone is ineffective. Tried melatonin which also did not work. - Start Rozerem 8 mg p.o. nightly - Continue sleep hygiene techniques.  Alcohol use disorder in partial remission Patient reports occasional use of alcohol especially during the holiday season. - Patient encouraged to continue to stay away. - Discontinue naltrexone for noncompliance.   Follow-up in clinic in 3 months or sooner video visit.  Collaboration of Care: Collaboration of Care: Referral or follow-up with counselor/therapist AEB patient encouraged to continue CBT with Ms. Felecia Jan.  Will coordinate care.  Patient/Guardian was advised Release of Information must be obtained prior to any record release in order to collaborate their care with an outside provider. Patient/Guardian was advised if they have not already done so to contact the  registration department to sign all necessary forms in order for [place] to release information regarding  their care.   Consent: Patient/Guardian gives verbal consent for treatment and assignment of benefits for services provided during this visit. Patient/Guardian expressed understanding and agreed to proceed.   This note was generated in part or whole with voice recognition software. Voice recognition is usually quite accurate but there are transcription errors that can and very often do occur. I apologize for any typographical errors that were not detected and corrected.    Jomarie Longs, MD 01/23/2024, 9:16 AM

## 2024-02-29 ENCOUNTER — Other Ambulatory Visit: Payer: Self-pay | Admitting: Nurse Practitioner

## 2024-02-29 DIAGNOSIS — E119 Type 2 diabetes mellitus without complications: Secondary | ICD-10-CM

## 2024-04-24 ENCOUNTER — Encounter: Payer: Self-pay | Admitting: Psychiatry

## 2024-04-24 ENCOUNTER — Telehealth: Payer: Self-pay | Admitting: Psychiatry

## 2024-04-24 DIAGNOSIS — F410 Panic disorder [episodic paroxysmal anxiety] without agoraphobia: Secondary | ICD-10-CM | POA: Diagnosis not present

## 2024-04-24 DIAGNOSIS — R259 Unspecified abnormal involuntary movements: Secondary | ICD-10-CM

## 2024-04-24 DIAGNOSIS — F3181 Bipolar II disorder: Secondary | ICD-10-CM

## 2024-04-24 DIAGNOSIS — F1021 Alcohol dependence, in remission: Secondary | ICD-10-CM

## 2024-04-24 DIAGNOSIS — R41840 Attention and concentration deficit: Secondary | ICD-10-CM | POA: Diagnosis not present

## 2024-04-24 DIAGNOSIS — G4701 Insomnia due to medical condition: Secondary | ICD-10-CM

## 2024-04-24 MED ORDER — ARIPIPRAZOLE 5 MG PO TABS: 5.0000 mg | ORAL_TABLET | Freq: Every day | ORAL | 1 refills | Status: DC

## 2024-04-24 MED ORDER — BENZTROPINE MESYLATE 1 MG PO TABS: 1.0000 mg | ORAL_TABLET | Freq: Every evening | ORAL | 1 refills | Status: AC | PRN

## 2024-04-24 NOTE — Progress Notes (Signed)
 Virtual Visit via Video Note  I connected with Jorge Wright on 04/24/24 at  8:30 AM EDT by a video enabled telemedicine application and verified that I am speaking with the correct person using two identifiers.  Location Provider Location : [facility] Patient Location : Home  Participants: Patient , Provider   I discussed the limitations of evaluation and management by telemedicine and the availability of in person appointments. The patient expressed understanding and agreed to proceed.   I discussed the assessment and treatment plan with the patient. The patient was provided an opportunity to ask questions and all were answered. The patient agreed with the plan and demonstrated an understanding of the instructions.   The patient was advised to call back or seek an in-person evaluation if the symptoms worsen or if the condition fails to improve as anticipated.   BH MD OP Progress Note  04/24/2024 8:59 AM Jorge Wright  MRN:  [id number]  Chief Complaint:  Chief Complaint  Patient presents with   Follow-up   Manic Behavior   Depression   Anxiety   Medication Refill   Abnormal movements   Discussed the use of AI scribe software for clinical note transcription with the patient, who gave verbal consent to proceed.  History of Present Illness Jorge Wright is a 38 year old Caucasian male, lives in [place], going through divorce, employed, has a history of bipolar disorder type II, panic disorder, alcohol use disorder in remission, attention and concentration deficit, diabetes mellitus, ulcerative colitis, hyperlipidemia was evaluated by telemedicine today.  He experiences significant restlessness and tremors, particularly at night. His girlfriend has noticed that he shakes while going to sleep. He feels extremely restless, often pacing around the house and feeling the need to move . He has started exercising, including riding his bike and taking walks, to manage stress and  restlessness.  He has a history of bipolar disorder type two and panic attacks. He is currently taking Abilify , which he suspects might be contributing to his restlessness. He has been on a 10 mg dose.  He is also compliant on Lexapro  20 mg daily.  No recent changes in his life could be contributing to increased anxiety.  He has a history of alcohol use, which is currently in early remission. He is doing well with sobriety and has not experienced any bouts of ulcerative colitis since he stopped drinking. He attributes previous gastrointestinal issues to alcohol consumption.  For sleep, he is taking Rozerem , which he finds effective as long as he takes it between 8 or 9 PM. He reports getting 6 to 7 hours of sleep per night and feels rested in the morning.  Denies thoughts of self-harm or harm to others.  He has been following up with a psychotherapist on a regular basis and is motivated to stay in therapy.    Visit Diagnosis:    ICD-10-CM   1. Bipolar II disorder, mild, depressed, with anxious distress (HCC)  F31.81 ARIPiprazole  (ABILIFY ) 5 MG tablet    2. Panic disorder  F41.0     3. Alcohol use disorder, severe, in early remission (HCC)  F10.21     4. Attention and concentration deficit  R41.840     5. Abnormal involuntary movement  R25.9 benztropine  (COGENTIN ) 1 MG tablet    6. Insomnia due to medical condition  G47.01    Bipolar disorder, anxiety      Past Psychiatric History: I have reviewed past psychiatric history from progress note  on 10/17/2023.  Past trials of medications like Wellbutrin, Lexapro , lorazepam , trazodone.  Past Medical History:  Past Medical History:  Diagnosis Date   Chicken pox    COVID-19    12/2020 sob mild, 05/21/21   Depression    Diabetes mellitus without complication (HCC)    History of alcohol abuse    Hyperlipidemia    Hypertension    UC (ulcerative colitis) (HCC)     Past Surgical History:  Procedure Laterality Date   COLONOSCOPY WITH  PROPOFOL  N/A 07/28/2022   Procedure: COLONOSCOPY WITH PROPOFOL ;  Surgeon: Toledo, Alphonsus Jeans, MD;  Location: [facility] ENDOSCOPY;  Service: Gastroenterology;  Laterality: N/A;  OK'D PER PM   ESOPHAGOGASTRODUODENOSCOPY (EGD) WITH PROPOFOL  N/A 07/28/2022   Procedure: ESOPHAGOGASTRODUODENOSCOPY (EGD) WITH PROPOFOL ;  Surgeon: Toledo, Alphonsus Jeans, MD;  Location: [facility] ENDOSCOPY;  Service: Gastroenterology;  Laterality: N/A;   MANDIBLE FRACTURE SURGERY      Family Psychiatric History: I have reviewed family psychiatric history from progress note on 10/17/2023.  Family History:  Family History  Problem Relation Age of Onset   Bipolar disorder Mother    Alcohol abuse Mother    Hypertension Mother    Heart disease Mother        had heart attack 34    Diabetes Mother        type 2   Heart attack Mother 54   Alcohol abuse Father    Hypertension Father    Diabetes Father        type 1   Pancreatitis Father    Heart attack Maternal Uncle        had heart attack    Alcohol abuse Maternal Grandmother    Heart disease Maternal Grandmother        died in 63s    Hypertension Maternal Grandmother    Depression Maternal Grandmother    Stroke Paternal Grandfather     Social History: I have reviewed social history from progress note on 10/17/2023. Social History   Socioeconomic History   Marital status: Legally Separated    Spouse name: Not on file   Number of children: 1   Years of education: Not on file   Highest education level: Bachelor's degree (e.g., BA, AB, BS)  Occupational History   Not on file  Tobacco Use   Smoking status: Former   Smokeless tobacco: Former  Building services engineer status: Every Day   Substances: Nicotine  Substance and Sexual Activity   Alcohol use: Yes    Alcohol/week: 6.0 standard drinks of alcohol    Types: 6 Standard drinks or equivalent per week    Comment: occasional, last drink 1 month ago, has a hx of alcoholism   Drug use: Not Currently    Types: Marijuana     Comment: occasional edibles   Sexual activity: Yes  Other Topics Concern   Not on file  Social History Narrative   Works IT    separated   Kids daughter Lavon Pound    Social Drivers of Health   Financial Resource Strain: Low Risk  (12/22/2021)   Overall Financial Resource Strain (CARDIA)    Difficulty of Paying Living Expenses: Not hard at all  Food Insecurity: Not on file  Transportation Needs: Not on file  Physical Activity: Not on file  Stress: Not on file  Social Connections: Not on file    Allergies:  Allergies  Allergen Reactions   Mounjaro  [Tirzepatide ]     Injection site reaction    Ozempic  (  0.25 Or 0.5 Mg-Dose) [Semaglutide (0.25 Or 0.5mg -Dos)]     N/v/diarrhea/ab pain    Trulicity  [Dulaglutide ]     Gi Sxs   Invokana  [Canagliflozin ] Rash    Reports topical rash on trunk that occurred after taking Invokana  for about a week. Resolved after medication discontinuation. No airway involvement.    Penicillins Rash    Metabolic Disorder Labs: Lab Results  Component Value Date   HGBA1C 8.5 (A) 07/01/2023   HGBA1C 8.5 07/01/2023   HGBA1C 8.5 (A) 07/01/2023   HGBA1C 8.5 (A) 07/01/2023   No results found for: "PROLACTIN" Lab Results  Component Value Date   CHOL 176 12/31/2022   TRIG 90.0 12/31/2022   HDL 46.60 12/31/2022   CHOLHDL 4 12/31/2022   VLDL 18.0 12/31/2022   LDLCALC 111 (H) 12/31/2022   LDLCALC 79 07/03/2019   Lab Results  Component Value Date   TSH 1.25 12/31/2022   TSH 1.34 04/20/2022    Therapeutic Level Labs: No results found for: "LITHIUM" No results found for: "VALPROATE" No results found for: "CBMZ"  Current Medications: Current Outpatient Medications  Medication Sig Dispense Refill   ARIPiprazole  (ABILIFY ) 5 MG tablet Take 1 tablet (5 mg total) by mouth daily. 30 tablet 1   atorvastatin  (LIPITOR) 40 MG tablet TAKE 1 TABLET DAILY AT NIGHT 90 tablet 0   benztropine  (COGENTIN ) 1 MG tablet Take 1 tablet (1 mg total) by mouth at bedtime as  needed for tremors. 30 tablet 1   Continuous Blood Gluc Sensor (FREESTYLE LIBRE 3 SENSOR) MISC Apply 1 each topically every 14 (fourteen) days. Place 1 sensor on the skin every 14 days. Use to check glucose continuously 2 each 11   escitalopram  (LEXAPRO ) 20 MG tablet Take 1 tablet (20 mg total) by mouth daily. 90 tablet 3   fenofibrate  (TRICOR ) 145 MG tablet Take 1 tablet (145 mg total) by mouth daily. 90 tablet 3   hydrOXYzine  (VISTARIL ) 25 MG capsule Take 1 capsule (25 mg total) by mouth 4 (four) times daily as needed. For severe anxiety 120 capsule 1   lisinopril -hydrochlorothiazide  (ZESTORETIC ) 20-12.5 MG tablet TAKE 2 TABLETS DAILY IN THE MORNING 180 tablet 3   metFORMIN  (GLUCOPHAGE -XR) 500 MG 24 hr tablet Take 2 tablets (1,000 mg total) by mouth daily with breakfast. 180 tablet 3   ramelteon  (ROZEREM ) 8 MG tablet Take 1 tablet (8 mg total) by mouth at bedtime. 30 tablet 1   No current facility-administered medications for this visit.     Musculoskeletal: Strength & Muscle Tone:  UTA Gait & Station:  Seated Patient leans: N/A  Psychiatric Specialty Exam: Review of Systems  Neurological:  Positive for tremors.  Psychiatric/Behavioral:  The patient is nervous/anxious.     There were no vitals taken for this visit.There is no height or weight on file to calculate BMI.  General Appearance: Casual  Eye Contact:  Fair  Speech:  Clear and Coherent  Volume:  Normal  Mood:  Anxious  Affect:  Congruent  Thought Process:  Goal Directed and Descriptions of Associations: Intact  Orientation:  Full (Time, Place, and Person)  Thought Content: Logical   Suicidal Thoughts:  No  Homicidal Thoughts:  No  Memory:  Immediate;   Fair Recent;   Fair Remote;   Fair  Judgement:  Fair  Insight:  Fair  Psychomotor Activity:  Normal  Concentration:  Concentration: Fair and Attention Span: Fair  Recall:  [employer] of Knowledge: Fair  Language: Fair  Akathisia:  Yes reports the '  need to move '   Handed:  Right  AIMS (if indicated): not done  Assets:  Desire for Improvement Housing Social Support Talents/Skills Transportation  ADL's:  Intact  Cognition: WNL  Sleep:  Fair   Screenings: Midwife Visit from 01/23/2024 in [facility] Office Visit from 11/14/2023 in [facility]  AIMS Total Score 0 0      GAD-7    Flowsheet Row Office Visit from 11/14/2023 in [facility] Office Visit from 10/17/2023 in [facility] Office Visit from 07/01/2023 in [facility] Visit from 12/31/2022 in [facility] [employer] Visit from 07/31/2021 in [facility]  Total GAD-7 Score 17 21 19 10 7       PHQ2-9    Flowsheet Row Office Visit from 11/14/2023 in [facility] Office Visit from 10/17/2023 in [facility] Office Visit from 07/01/2023 in [facility] Visit from 12/31/2022 in [facility] [employer] Visit from 04/27/2022 in [facility] [employer] [facility]  PHQ-2 Total Score 2 4 4 1  0  PHQ-9 Total Score 9 21 21 8  --      Flowsheet Row Video Visit from 04/24/2024 in [facility] Office Visit from 01/23/2024 in [facility] Office Visit from 11/14/2023 in [facility]  C-SSRS RISK CATEGORY No Risk No Risk No Risk        Assessment and Plan:Renee A Zara is a 39 year old Caucasian male, has a history of bipolar disorder type II, panic attacks, alcoholism, currently in remission, was evaluated by  telemedicine today, discussed assessment and plan as noted below.  Assessment & Plan Bipolar disorder, type II most recent episode depressed in remission Bipolar disorder type II is well-managed with no manic or depressive episodes since the last visit. Monitoring is necessary due to the reduction of Abilify  dosage due to recent side effects. - Monitor symptoms with the reduction of Abilify  dosage. - Start Abilify  5 mg daily - Consider adding another mood stabilizer or switching Abilify  if symptoms worsen.  Abnormal involuntary movements-likely akathisia and parkinsonian symptoms due to Abilify  Restlessness and tremors are likely side effects of Abilify , presenting as fidgetiness, tremors during sleep, and a constant need to move. Differential diagnosis includes anxiety, but no situational anxiety reported. Decision made to reduce Abilify  dosage and introduce benztropine  to manage symptoms. Propranolol considered as an alternative if symptoms persist and blood pressure allows. - Reduce Abilify  dosage to 5 mg - Prescribe Benztropine  1 mg at bedtime as needed for restlessness and tremors. - Educate on Benztropine  side effects: constipation, dry mouth, blurry vision. - Consider Propranolol if symptoms persist and blood pressure permits.  Panic disorder-stable No current panic attacks reported. - Continue Lexapro  20 mg daily - Continue CBT with Ms. Quirino Buckles - Continue Hydroxyzine  25 mg 4 times a day as needed  Insomnia-improving Currently reports sleep is overall good on the current medication regimen. - Continue Rozerem  8 mg daily at bedtime  Alcohol use disorder, in partial remission Alcohol use disorder is in partial remission with only social alcohol consumption since the last visit. Improvement in gastrointestinal symptoms attributed to cessation of alcohol use. - Continue abstinence  from alcohol. - Monitor for recurrence of alcohol use.  Follow-up Follow-up in clinic in 4 to 5  weeks or sooner if needed.  Collaboration of Care: Collaboration of Care: Referral or follow-up with counselor/therapist AEB continue psychotherapy sessions.  Patient/Guardian was advised Release of Information must be obtained prior to any record release in order to collaborate their care with an outside provider. Patient/Guardian was advised if they have not already done so to contact the registration department to sign all necessary forms in order for us  to release information regarding their care.   Consent: Patient/Guardian gives verbal consent for treatment and assignment of benefits for services provided during this visit. Patient/Guardian expressed understanding and agreed to proceed.  This note was generated in part or whole with voice recognition software. Voice recognition is usually quite accurate but there are transcription errors that can and very often do occur. I apologize for any typographical errors that were not detected and corrected.     Marlenne Ridge, MD 04/26/2024, 7:40 AM

## 2024-04-24 NOTE — Patient Instructions (Signed)
Benztropine Tablets What is this medication? BENZTROPINE (BENZ troe peen) treats movement disorders, including those caused by Parkinson disease and some medications. It works by balancing substances in your brain that help manage body movements and coordination. This reduces symptoms, such as body stiffness and tremors. This medicine may be used for other purposes; ask your health care provider or pharmacist if you have questions. COMMON BRAND NAME(S): Cogentin What should I tell my care team before I take this medication? They need to know if you have any of these conditions: Glaucoma Heart disease or a rapid heartbeat Mental health condition Prostate trouble Tardive dyskinesia An unusual or allergic reaction to benztropine, lactose, other medications, foods, dyes, or preservatives Pregnant or trying to get pregnant Breast-feeding How should I use this medication? Take this medication by mouth with a full glass of water. Take it as directed on the prescription label. Keep taking it unless your care team tells you to stop. Talk to your care team about the use of this medication in children. While it may be prescribed for children as young as 3 years for selected conditions, precautions do apply. Overdosage: If you think you have taken too much of this medicine contact a poison control center or emergency room at once. NOTE: This medicine is only for you. Do not share this medicine with others. What if I miss a dose? If you miss a dose, take it as soon as you can. If it is almost time for your next dose, take only that dose. Do not take double or extra doses. What may interact with this medication? Haloperidol Medications for movement abnormalities, such as Parkinson disease Phenothiazines, such as chlorpromazine, mesoridazine, prochlorperazine, thioridazine Some antidepressants, such as amitriptyline, desipramine, doxepin, nortriptyline Stimulant medications for ADHD, weight loss, or  staying awake Tegaserod This list may not describe all possible interactions. Give your health care provider a list of all the medicines, herbs, non-prescription drugs, or dietary supplements you use. Also tell them if you smoke, drink alcohol, or use illegal drugs. Some items may interact with your medicine. What should I watch for while using this medication? Visit your care team for regular checks on your progress. Tell your care team if your symptoms do not start to get better or if they get worse. This medication may affect your coordination, reaction time, or judgement. Do not drive or operate machinery until you know how this medication affects you. Sit up or stand slowly to reduce the risk of dizzy or fainting spells. Drinking alcohol with this medication can increase the risk of these side effects. Your mouth may get dry. Chewing sugarless gum or sucking hard candy and drinking plenty of water may help. Contact your care team if the problem does not go away or is severe. This medication may cause dry eyes and blurred vision. If you wear contact lenses, you may feel some discomfort. Lubricating eye drops may help. See your care team if the problem does not go away or is severe. Avoid extreme heat. This medication can cause you to sweat less than normal. Your body temperature could increase to dangerous levels, which may lead to heat stroke. What side effects may I notice from receiving this medication? Side effects that you should report to your care team as soon as possible: Allergic reactions--skin rash, itching, hives, swelling of the face, lips, tongue, or throat Anticholinergic toxicity--flushed face, blurry vision, dry mouth and skin, confusion, fast or irregular heartbeat, trouble passing urine, constipation Fever that does not  go away, decreased sweating Hallucinations Sudden eye pain or change in vision such as blurry vision, seeing halos around lights, vision loss Side effects that  usually do not require medical attention (report to your care team if they continue or are bothersome): Nausea Vomiting This list may not describe all possible side effects. Call your doctor for medical advice about side effects. You may report side effects to FDA at 1-800-FDA-1088. Where should I keep my medication? Keep out of the reach of children and pets. Store at room temperature between 20 and 25 degrees C (68 and 77 degrees F). Get rid of any unused medication after the expiration date. To get rid of medications that are no longer needed or have expired: Take the medication to a take-back program. Check with your pharmacy or law enforcement to find a location. If you cannot return the medication, check the label or package insert to see if the medication should be thrown out in the garbage or flushed down the toilet. If you are not sure, ask your care team. If it is safe to put it in the trash, empty the medication out of the container. Mix the medication with cat litter, dirt, coffee grounds, or other unwanted substance. Seal the mixture in a bag or container. Put it in the trash. NOTE: This sheet is a summary. It may not cover all possible information. If you have questions about this medicine, talk to your doctor, pharmacist, or health care provider.  2024 Elsevier/Gold Standard (2022-04-12 00:00:00)

## 2024-05-29 ENCOUNTER — Encounter: Payer: Self-pay | Admitting: Psychiatry

## 2024-05-29 ENCOUNTER — Telehealth (INDEPENDENT_AMBULATORY_CARE_PROVIDER_SITE_OTHER): Admitting: Psychiatry

## 2024-05-29 ENCOUNTER — Other Ambulatory Visit: Payer: Self-pay | Admitting: Nurse Practitioner

## 2024-05-29 DIAGNOSIS — R259 Unspecified abnormal involuntary movements: Secondary | ICD-10-CM

## 2024-05-29 DIAGNOSIS — F410 Panic disorder [episodic paroxysmal anxiety] without agoraphobia: Secondary | ICD-10-CM

## 2024-05-29 DIAGNOSIS — F1021 Alcohol dependence, in remission: Secondary | ICD-10-CM | POA: Diagnosis not present

## 2024-05-29 DIAGNOSIS — F3176 Bipolar disorder, in full remission, most recent episode depressed: Secondary | ICD-10-CM | POA: Diagnosis not present

## 2024-05-29 DIAGNOSIS — G4701 Insomnia due to medical condition: Secondary | ICD-10-CM | POA: Diagnosis not present

## 2024-05-29 DIAGNOSIS — R41840 Attention and concentration deficit: Secondary | ICD-10-CM

## 2024-05-29 DIAGNOSIS — E119 Type 2 diabetes mellitus without complications: Secondary | ICD-10-CM

## 2024-05-29 MED ORDER — ESCITALOPRAM OXALATE 10 MG PO TABS
10.0000 mg | ORAL_TABLET | Freq: Every day | ORAL | 1 refills | Status: DC
Start: 2024-05-29 — End: 2024-07-17

## 2024-05-29 NOTE — Progress Notes (Signed)
 Virtual Visit via Video Note  I connected with Jorge Wright on 05/29/24 at  1:20 PM EDT by a video enabled telemedicine application and verified that I am speaking with the correct person using two identifiers.  Location Provider Location : [facility] Patient Location : Home  Participants: Patient , Provider    I discussed the limitations of evaluation and management by telemedicine and the availability of in person appointments. The patient expressed understanding and agreed to proceed.    I discussed the assessment and treatment plan with the patient. The patient was provided an opportunity to ask questions and all were answered. The patient agreed with the plan and demonstrated an understanding of the instructions.   The patient was advised to call back or seek an in-person evaluation if the symptoms worsen or if the condition fails to improve as anticipated.  BH MD OP Progress Note  05/29/2024 1:40 PM SYLAR VOONG  MRN:  [id number]  Chief Complaint:  Chief Complaint  Patient presents with   Follow-up   Anxiety   Depression   Medication Refill   Discussed the use of AI scribe software for clinical note transcription with the patient, who gave verbal consent to proceed.  History of Present Illness Jorge KROPF is a 38 year old Caucasian male, lives in [place], going through divorce, employed, has a history of bipolar disorder type II, panic disorder, alcohol use disorder in remission, attention concentration deficit, diabetes mellitus, ulcerative colitis, hyperlipidemia was evaluated by telemedicine today.  He has been off Lexapro  for a few days after running out of the medication. He feels 'a little more calm' since discontinuing it. He is prescribed Lexapro , 20 mg, but believes it may contribute to his fidgetiness and restlessness. No recent panic attacks have occurred, with the last significant one approximately six weeks ago, managed with hydroxyzine  and rest.  He  experiences ongoing issues with fidgeting and restlessness, though twitching and shaking have subsided somewhat. He takes benztropine  prescribed last visit for restlessness as needed, which he finds helpful.  Lowering the dosage of Abilify  may have also helped.  He is currently concerned about his ability to focus and is awaiting ADHD testing scheduled for October. His work is affected by his inability to focus, leading to missed meetings and assignments.  He however reports overall mood symptoms otherwise stable.  Denies any significant depression, manic symptoms.  He uses hydroxyzine  as needed for anxiety, which he feels helps, though he questions if it's partly a placebo effect. He also uses ramelteon  for sleep, taking it about once a week, and reports improved sleep. He has started working out again, which he finds beneficial for his sleep.  He denies any use of alcohol and occasionally uses CBD gummies. He has no thoughts of self-harm or harming others, though he sometimes feels that 'things will be easier if I wasn't here,' particularly during stressful times, such as returning from vacation and missing his daughter. He copes by talking to his parents or girlfriend, spending time alone, and using positive self-talk.  He attends therapy with Ms.Quirino Buckles once a month, which he finds helpful.   He denies any other concerns today.     Visit Diagnosis:    ICD-10-CM   1. Bipolar disorder, in full remission, most recent episode depressed (HCC)  F31.76    Type II, mixed features, mild    2. Panic disorder  F41.0 escitalopram  (LEXAPRO ) 10 MG tablet    3. Alcohol use disorder, severe,  in early remission (HCC)  F10.21     4. Insomnia due to medical condition  G47.01    mood disorder    5. Abnormal involuntary movement  R25.9     6. Attention and concentration deficit  R41.840       Past Psychiatric History: I have reviewed past psychiatric history from progress note on 10/17/2023.   Past trials of medications like Wellbutrin, Lexapro , lorazepam , trazodone.  Past Medical History:  Past Medical History:  Diagnosis Date   Chicken pox    COVID-19    12/2020 sob mild, 05/21/21   Depression    Diabetes mellitus without complication (HCC)    History of alcohol abuse    Hyperlipidemia    Hypertension    UC (ulcerative colitis) (HCC)     Past Surgical History:  Procedure Laterality Date   COLONOSCOPY WITH PROPOFOL  N/A 07/28/2022   Procedure: COLONOSCOPY WITH PROPOFOL ;  Surgeon: Toledo, Alphonsus Jeans, MD;  Location: [facility] ENDOSCOPY;  Service: Gastroenterology;  Laterality: N/A;  OK'D PER PM   ESOPHAGOGASTRODUODENOSCOPY (EGD) WITH PROPOFOL  N/A 07/28/2022   Procedure: ESOPHAGOGASTRODUODENOSCOPY (EGD) WITH PROPOFOL ;  Surgeon: Toledo, Alphonsus Jeans, MD;  Location: [facility] ENDOSCOPY;  Service: Gastroenterology;  Laterality: N/A;   MANDIBLE FRACTURE SURGERY      Family Psychiatric History: I have reviewed family psychiatric history from progress note on 10/17/2023.  Family History:  Family History  Problem Relation Age of Onset   Bipolar disorder Mother    Alcohol abuse Mother    Hypertension Mother    Heart disease Mother        had heart attack 36    Diabetes Mother        type 2   Heart attack Mother 28   Alcohol abuse Father    Hypertension Father    Diabetes Father        type 1   Pancreatitis Father    Heart attack Maternal Uncle        had heart attack    Alcohol abuse Maternal Grandmother    Heart disease Maternal Grandmother        died in 64s    Hypertension Maternal Grandmother    Depression Maternal Grandmother    Stroke Paternal Grandfather     Social History: I have reviewed social history from progress note on 10/17/2023. Social History   Socioeconomic History   Marital status: Legally Separated    Spouse name: Not on file   Number of children: 1   Years of education: Not on file   Highest education level: Bachelor's degree (e.g., BA, AB, BS)   Occupational History   Not on file  Tobacco Use   Smoking status: Former   Smokeless tobacco: Former  Building services engineer status: Every Day   Substances: Nicotine  Substance and Sexual Activity   Alcohol use: Yes    Alcohol/week: 6.0 standard drinks of alcohol    Types: 6 Standard drinks or equivalent per week    Comment: occasional, last drink 1 month ago, has a hx of alcoholism   Drug use: Not Currently    Types: Marijuana    Comment: occasional edibles   Sexual activity: Yes  Other Topics Concern   Not on file  Social History Narrative   Works IT    separated   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    Social Drivers of Health   Financial Resource Strain: Low Risk  (12/22/2021)   Overall Financial Resource Strain (CARDIA)    Difficulty  of Paying Living Expenses: Not hard at all  Food Insecurity: Not on file  Transportation Needs: Not on file  Physical Activity: Not on file  Stress: Not on file  Social Connections: Not on file    Allergies:  Allergies  Allergen Reactions   Mounjaro  [Tirzepatide ]     Injection site reaction    Ozempic  (0.25 Or 0.5 Mg-Dose) [Semaglutide (0.25 Or 0.5mg -Dos)]     N/v/diarrhea/ab pain    Trulicity  [Dulaglutide ]     Gi Sxs   Invokana  [Canagliflozin ] Rash    Reports topical rash on trunk that occurred after taking Invokana  for about a week. Resolved after medication discontinuation. No airway involvement.    Penicillins Rash    Metabolic Disorder Labs: Lab Results  Component Value Date   HGBA1C 8.5 (A) 07/01/2023   HGBA1C 8.5 07/01/2023   HGBA1C 8.5 (A) 07/01/2023   HGBA1C 8.5 (A) 07/01/2023   No results found for: "PROLACTIN" Lab Results  Component Value Date   CHOL 176 12/31/2022   TRIG 90.0 12/31/2022   HDL 46.60 12/31/2022   CHOLHDL 4 12/31/2022   VLDL 18.0 12/31/2022   LDLCALC 111 (H) 12/31/2022   LDLCALC 79 07/03/2019   Lab Results  Component Value Date   TSH 1.25 12/31/2022   TSH 1.34 04/20/2022    Therapeutic Level  Labs: No results found for: "LITHIUM" No results found for: "VALPROATE" No results found for: "CBMZ"  Current Medications: Current Outpatient Medications  Medication Sig Dispense Refill   escitalopram  (LEXAPRO ) 10 MG tablet Take 1 tablet (10 mg total) by mouth daily. 30 tablet 1   ARIPiprazole  (ABILIFY ) 5 MG tablet Take 1 tablet (5 mg total) by mouth daily. 30 tablet 1   atorvastatin  (LIPITOR) 40 MG tablet TAKE 1 TABLET DAILY AT NIGHT 90 tablet 0   benztropine  (COGENTIN ) 1 MG tablet Take 1 tablet (1 mg total) by mouth at bedtime as needed for tremors. 30 tablet 1   Continuous Blood Gluc Sensor (FREESTYLE LIBRE 3 SENSOR) MISC Apply 1 each topically every 14 (fourteen) days. Place 1 sensor on the skin every 14 days. Use to check glucose continuously 2 each 11   fenofibrate  (TRICOR ) 145 MG tablet Take 1 tablet (145 mg total) by mouth daily. 90 tablet 3   hydrOXYzine  (VISTARIL ) 25 MG capsule Take 1 capsule (25 mg total) by mouth 4 (four) times daily as needed. For severe anxiety 120 capsule 1   lisinopril -hydrochlorothiazide  (ZESTORETIC ) 20-12.5 MG tablet TAKE 2 TABLETS DAILY IN THE MORNING 180 tablet 3   metFORMIN  (GLUCOPHAGE -XR) 500 MG 24 hr tablet Take 2 tablets (1,000 mg total) by mouth daily with breakfast. 180 tablet 3   ramelteon  (ROZEREM ) 8 MG tablet Take 1 tablet (8 mg total) by mouth at bedtime. 30 tablet 1   No current facility-administered medications for this visit.     Musculoskeletal: Strength & Muscle Tone: UTA Gait & Station: Seated Patient leans: N/A  Psychiatric Specialty Exam: Review of Systems  Psychiatric/Behavioral:  The patient is nervous/anxious.     There were no vitals taken for this visit.There is no height or weight on file to calculate BMI.  General Appearance: Casual  Eye Contact:  Fair  Speech:  Clear and Coherent  Volume:  Normal  Mood:  Anxious  Affect:  Congruent  Thought Process:  Goal Directed and Descriptions of Associations: Intact   Orientation:  Full (Time, Place, and Person)  Thought Content: Logical   Suicidal Thoughts:  No  Homicidal Thoughts:  No  Memory:  Immediate;   Fair Recent;   Fair Remote;   Fair  Judgement:  Fair  Insight:  Fair  Psychomotor Activity:  Normal  Concentration:  Concentration: Fair and Attention Span: Fair  Recall:  [employer] of Knowledge: Fair  Language: Fair  Akathisia:  No  Handed:  Right  AIMS (if indicated): Denies any abnormal movements , tremors  Assets:  Manufacturing systems engineer Desire for Improvement Housing Social Support  ADL's:  Intact  Cognition: WNL  Sleep:  Fair   Screenings: Midwife Visit from 01/23/2024 in [facility] Office Visit from 11/14/2023 in [facility]  AIMS Total Score 0 0      GAD-7    Flowsheet Row Office Visit from 11/14/2023 in [facility] Office Visit from 10/17/2023 in [facility] Office Visit from 07/01/2023 in [facility] [employer] Visit from 12/31/2022 in [facility] [employer] Visit from 07/31/2021 in [facility] [employer]  Total GAD-7 Score 17 21 19 10 7       [employer]    Flowsheet Row Office Visit from 11/14/2023 in [facility] Office Visit from 10/17/2023 in [facility] Office Visit from 07/01/2023 in [facility] Visit from 12/31/2022 in [facility] [employer] Visit from 04/27/2022 in [facility] [employer]  PHQ-2 Total Score 2 4 4 1  0  PHQ-9 Total Score 9 21 21 8  --      Flowsheet Row Video Visit from 05/29/2024 in [facility]  Video Visit from 04/24/2024 in [facility] Office Visit from 01/23/2024 in [facility]  C-SSRS RISK CATEGORY Low Risk No Risk No Risk        Assessment and Plan: CHASETON YEPIZ is a 38 year old Caucasian male, has a history of bipolar disorder, type II, panic attacks, alcoholism currently in remission was evaluated by telemedicine today.  Discussed assessment and plan as noted below.  Bipolar type II most recent episode depressed in remission Recently Abilify  dosage reduced due to possible side effects of akathisia.  Currently tolerating the lower dose and reports mood symptoms are stable. Continue Abilify  5 mg daily  Abnormal involuntary movements-likely akathisia-improving Currently reports twitching and the need to move has improved on the current dosage of Abilify  as well as Benztropine  which he uses as needed. Continue Benztropine  1 mg at bedtime as needed.  Panic disorder-stable Denies any significant panic symptoms at this time, last 1 was few weeks ago triggered by situational stressors.  Believes the higher dosage of Lexapro  likely causing anxiety and restlessness since he feels calmer since he ran out of Lexapro  2 days ago.  Agreeable to trial of lower dosage of Lexapro .  Will consider changing to another SSRI in the future if needed. Reduce Lexapro  to 10 mg daily. Continue Hydroxyzine  25 mg 4 times a day as needed Continue CBT with Ms. Quirino Buckles.  Insomnia-improving Denies any significant sleep issues. Continue Rozerem  8 mg daily at bedtime  Alcohol use disorder in partial remission Currently denies any significant alcohol use, uses it only in social situations, occasional. Continue abstinence from alcohol.  Attention concentration deficit-pending testing.  Referred to Dr. Cheryll Corti.  Follow-up Follow-up  in clinic in 3 to 4 weeks or sooner if needed.   Collaboration of Care:  Collaboration of Care: Referral or follow-up with counselor/therapist AEB patient encouraged to continue CBT with Ms. Quirino Buckles.  Crisis plan discussed with patient.   Patient/Guardian was advised Release of Information must be obtained prior to any record release in order to collaborate their care with an outside provider. Patient/Guardian was advised if they have not already done so to contact the registration department to sign all necessary forms in order for us  to release information regarding their care.   Consent: Patient/Guardian gives verbal consent for treatment and assignment of benefits for services provided during this visit. Patient/Guardian expressed understanding and agreed to proceed.   This note was generated in part or whole with voice recognition software. Voice recognition is usually quite accurate but there are transcription errors that can and very often do occur. I apologize for any typographical errors that were not detected and corrected.    Evani Shrider, MD 05/30/2024, 10:09 AM

## 2024-06-22 ENCOUNTER — Telehealth (INDEPENDENT_AMBULATORY_CARE_PROVIDER_SITE_OTHER): Admitting: Psychiatry

## 2024-06-22 ENCOUNTER — Encounter: Payer: Self-pay | Admitting: Psychiatry

## 2024-06-22 DIAGNOSIS — F3176 Bipolar disorder, in full remission, most recent episode depressed: Secondary | ICD-10-CM | POA: Insufficient documentation

## 2024-06-22 DIAGNOSIS — G2571 Drug induced akathisia: Secondary | ICD-10-CM | POA: Insufficient documentation

## 2024-06-22 DIAGNOSIS — F410 Panic disorder [episodic paroxysmal anxiety] without agoraphobia: Secondary | ICD-10-CM | POA: Diagnosis not present

## 2024-06-22 DIAGNOSIS — F3181 Bipolar II disorder: Secondary | ICD-10-CM

## 2024-06-22 DIAGNOSIS — F1021 Alcohol dependence, in remission: Secondary | ICD-10-CM

## 2024-06-22 DIAGNOSIS — Z9189 Other specified personal risk factors, not elsewhere classified: Secondary | ICD-10-CM | POA: Insufficient documentation

## 2024-06-22 DIAGNOSIS — T50905A Adverse effect of unspecified drugs, medicaments and biological substances, initial encounter: Secondary | ICD-10-CM | POA: Diagnosis not present

## 2024-06-22 DIAGNOSIS — R41840 Attention and concentration deficit: Secondary | ICD-10-CM

## 2024-06-22 MED ORDER — ARIPIPRAZOLE 5 MG PO TABS: 2.5000 mg | ORAL_TABLET | Freq: Every day | ORAL | Status: DC

## 2024-06-22 MED ORDER — LURASIDONE HCL 40 MG PO TABS: ORAL_TABLET | ORAL | 0 refills | Status: DC

## 2024-06-22 NOTE — Progress Notes (Signed)
 Virtual Visit via Video Note  I connected with Jorge Wright on 06/22/24 at 11:00 AM EDT by a video enabled telemedicine application and verified that I am speaking with the correct person using two identifiers.  Location Provider Location : [facility] Patient Location : Home  Participants: Patient , Provider    I discussed the limitations of evaluation and management by telemedicine and the availability of in person appointments. The patient expressed understanding and agreed to proceed.  I discussed the assessment and treatment plan with the patient. The patient was provided an opportunity to ask questions and all were answered. The patient agreed with the plan and demonstrated an understanding of the instructions.   The patient was advised to call back or seek an in-person evaluation if the symptoms worsen or if the condition fails to improve as anticipated. [facility] MD OP Progress Note  06/22/2024 1:54 PM Jorge Wright  MRN:  [id number]  Chief Complaint:  Chief Complaint  Patient presents with   Depression   Follow-up   Anxiety   Medication Refill   Discussed the use of AI scribe software for clinical note transcription with the patient, who gave verbal consent to proceed.  History of Present Illness Jorge Wright is a 38 year old Caucasian male, lives in [place] going through divorce, employed, has a history of bipolar disorder, panic disorder, alcohol use disorder in remission, attention and concentration deficit, diabetes mellitus, ulcerative colitis, hyperlipidemia was evaluated by telemedicine today.  Since his last appointment in early June, he has experienced improvement in involuntary movements and restlessness after reducing his Lexapro  to 10 mg and continuing Abilify  at 5 mg. However, he continues to struggle with racing thoughts, particularly in the morning, which leave him feeling exhausted by midday. He describes an 'internal restlessness' that persists despite  these medication adjustments.  His current medication regimen includes Abilify  5 mg, Lexapro  10 mg, and Rozerem  for sleep issues. The pacing and involuntary movements have subsided since the reduction of Abilify  from 10 mg to 5 mg, but the racing thoughts and internal restlessness remain problematic.  He discussed these symptoms with Ms. Ellouise Hummer, therapist, who suggested that he discuss this with his provider.  He has not tried other medications in the same class as Abilify  before.  In terms of social history, he reports minimal alcohol use, with his last drink being a single beer at a cousin's bachelor party two weeks ago. He denies any regular alcohol consumption or binge drinking.  Denies thoughts of self-harm or harm to others, although he sometimes feels that 'it would be easier if I wasn't here' without active plans or intent.  Denies any other concerns.  Visit Diagnosis:    ICD-10-CM   1. Bipolar II disorder, mild, depressed, with anxious distress (HCC)  F31.81 ARIPiprazole  (ABILIFY ) 5 MG tablet    lurasidone (LATUDA) 40 MG TABS tablet    2. Panic disorder  F41.0     3. Drug induced akathisia  G25.71    Likely secondary to psychotropics    4. Alcohol use disorder, severe, in early remission (HCC)  F10.21     5. At risk for prolonged QT interval syndrome  Z91.89 EKG 12-Lead    6. Attention and concentration deficit  R41.840       Past Psychiatric History: I have reviewed past psychiatric history from progress note on 10/17/2023.  Past trials of medications like Wellbutrin, Lexapro , lorazepam , trazodone.  Past Medical History:  Past Medical History:  Diagnosis Date   Chicken pox    COVID-19    12/2020 sob mild, 05/21/21   Depression    Diabetes mellitus without complication (HCC)    History of alcohol abuse    Hyperlipidemia    Hypertension    UC (ulcerative colitis) [facility])     Past Surgical History:  Procedure Laterality Date   COLONOSCOPY WITH PROPOFOL  N/A  07/28/2022   Procedure: COLONOSCOPY WITH PROPOFOL ;  Surgeon: Toledo, Ladell POUR, MD;  Location: [facility] ENDOSCOPY;  Service: Gastroenterology;  Laterality: N/A;  OK'D PER PM   ESOPHAGOGASTRODUODENOSCOPY (EGD) WITH PROPOFOL  N/A 07/28/2022   Procedure: ESOPHAGOGASTRODUODENOSCOPY (EGD) WITH PROPOFOL ;  Surgeon: Toledo, Ladell POUR, MD;  Location: [facility] ENDOSCOPY;  Service: Gastroenterology;  Laterality: N/A;   MANDIBLE FRACTURE SURGERY      Family Psychiatric History: I have reviewed family psychiatric history from progress note on 10/17/2023.  Family History:  Family History  Problem Relation Age of Onset   Bipolar disorder Mother    Alcohol abuse Mother    Hypertension Mother    Heart disease Mother        had heart attack 63    Diabetes Mother        type 2   Heart attack Mother 86   Alcohol abuse Father    Hypertension Father    Diabetes Father        type 1   Pancreatitis Father    Heart attack Maternal Uncle        had heart attack    Alcohol abuse Maternal Grandmother    Heart disease Maternal Grandmother        died in 97s    Hypertension Maternal Grandmother    Depression Maternal Grandmother    Stroke Paternal Grandfather     Social History: I have reviewed social history from progress note on 10/17/2023. Social History   Socioeconomic History   Marital status: Legally Separated    Spouse name: Not on file   Number of children: 1   Years of education: Not on file   Highest education level: Bachelor's degree (e.g., BA, AB, BS)  Occupational History   Not on file  Tobacco Use   Smoking status: Former   Smokeless tobacco: Former  Building services engineer status: Every Day   Substances: Nicotine  Substance and Sexual Activity   Alcohol use: Yes    Alcohol/week: 6.0 standard drinks of alcohol    Types: 6 Standard drinks or equivalent per week    Comment: occasional, last drink 1 month ago, has a hx of alcoholism   Drug use: Not Currently    Types: Marijuana    Comment:  occasional edibles   Sexual activity: Yes  Other Topics Concern   Not on file  Social History Narrative   Works IT    separated   Kids daughter [name]    Social Drivers of Health   Financial Resource Strain: Low Risk  (12/22/2021)   Overall Financial Resource Strain (CARDIA)    Difficulty of Paying Living Expenses: Not hard at all  Food Insecurity: Not on file  Transportation Needs: Not on file  Physical Activity: Not on file  Stress: Not on file  Social Connections: Not on file    Allergies:  Allergies  Allergen Reactions   Mounjaro  [Tirzepatide ]     Injection site reaction    Ozempic  (0.25 Or 0.5 Mg-Dose) [Semaglutide (0.25 Or 0.5mg -Dos)]     N/v/diarrhea/ab pain    Trulicity  Clementius.Coaster ]  Gi Sxs   Invokana  [Canagliflozin ] Rash    Reports topical rash on trunk that occurred after taking Invokana  for about a week. Resolved after medication discontinuation. No airway involvement.    Penicillins Rash    Metabolic Disorder Labs: Lab Results  Component Value Date   HGBA1C 8.5 (A) 07/01/2023   HGBA1C 8.5 07/01/2023   HGBA1C 8.5 (A) 07/01/2023   HGBA1C 8.5 (A) 07/01/2023   No results found for: PROLACTIN Lab Results  Component Value Date   CHOL 176 12/31/2022   TRIG 90.0 12/31/2022   HDL 46.60 12/31/2022   CHOLHDL 4 12/31/2022   VLDL 18.0 12/31/2022   LDLCALC 111 (H) 12/31/2022   LDLCALC 79 07/03/2019   Lab Results  Component Value Date   TSH 1.25 12/31/2022   TSH 1.34 04/20/2022    Therapeutic Level Labs: No results found for: LITHIUM No results found for: VALPROATE No results found for: CBMZ  Current Medications: Current Outpatient Medications  Medication Sig Dispense Refill   lurasidone (LATUDA) 40 MG TABS tablet Take 0.5 tablets (20 mg total) by mouth daily with breakfast for 15 days, THEN 1 tablet (40 mg total) daily with breakfast for 15 days. 23 tablet 0   ARIPiprazole  (ABILIFY ) 5 MG tablet Take 0.5 tablets (2.5 mg total) by mouth  daily for 7 days.     atorvastatin  (LIPITOR) 40 MG tablet TAKE 1 TABLET DAILY AT NIGHT 90 tablet 0   benztropine  (COGENTIN ) 1 MG tablet Take 1 tablet (1 mg total) by mouth at bedtime as needed for tremors. 30 tablet 1   Continuous Blood Gluc Sensor (FREESTYLE LIBRE 3 SENSOR) MISC Apply 1 each topically every 14 (fourteen) days. Place 1 sensor on the skin every 14 days. Use to check glucose continuously 2 each 11   escitalopram  (LEXAPRO ) 10 MG tablet Take 1 tablet (10 mg total) by mouth daily. 30 tablet 1   fenofibrate  (TRICOR ) 145 MG tablet Take 1 tablet (145 mg total) by mouth daily. 90 tablet 3   hydrOXYzine  (VISTARIL ) 25 MG capsule Take 1 capsule (25 mg total) by mouth 4 (four) times daily as needed. For severe anxiety 120 capsule 1   lisinopril -hydrochlorothiazide  (ZESTORETIC ) 20-12.5 MG tablet TAKE 2 TABLETS DAILY IN THE MORNING 180 tablet 3   metFORMIN  (GLUCOPHAGE -XR) 500 MG 24 hr tablet Take 2 tablets (1,000 mg total) by mouth daily with breakfast. 180 tablet 3   ramelteon  (ROZEREM ) 8 MG tablet Take 1 tablet (8 mg total) by mouth at bedtime. 30 tablet 1   No current facility-administered medications for this visit.     Musculoskeletal: Strength & Muscle Tone: UTA Gait & Station: Seated Patient leans: N/A  Psychiatric Specialty Exam: Review of Systems  Psychiatric/Behavioral:  Positive for decreased concentration. The patient is nervous/anxious.        Racing thoughts    There were no vitals taken for this visit.There is no height or weight on file to calculate BMI.  General Appearance: Casual  Eye Contact:  Fair  Speech:  Clear and Coherent  Volume:  Normal  Mood:  Anxious and internal restlessness, racing thoughts  Affect:  Congruent  Thought Process:  Goal Directed and Descriptions of Associations: Intact  Orientation:  Full (Time, Place, and Person)  Thought Content: Logical   Suicidal Thoughts:  No  Homicidal Thoughts:  No  Memory:  Immediate;   Fair Recent;    Fair Remote;   Fair  Judgement:  Fair  Insight:  Fair  Psychomotor Activity:  Normal  Concentration:  Concentration: Fair and Attention Span: Fair  Recall:  [employer] of Knowledge: Fair  Language: Fair  Akathisia:  Yes  Handed:  Right  AIMS (if indicated): not done  Assets:  Manufacturing systems engineer Desire for Improvement Housing Social Support Transportation  ADL's:  Intact  Cognition: WNL  Sleep:  Fair   Screenings: Midwife Visit from 01/23/2024 in [facility] Office Visit from 11/14/2023 in [facility]  AIMS Total Score 0 0   GAD-7    Flowsheet Row Office Visit from 11/14/2023 in [facility] Office Visit from 10/17/2023 in [facility] Office Visit from 07/01/2023 in [facility] [employer] Visit from 12/31/2022 in [facility] [facility] [employer] Visit from 07/31/2021 in [facility] [place] [facility] [employer]  Total GAD-7 Score 17 21 19 10 7    [employer]    Flowsheet Row Office Visit from 11/14/2023 in [facility] Office Visit from 10/17/2023 in [facility] Office Visit from 07/01/2023 in [facility] Visit from 12/31/2022 in [facility] [place] [facility] [employer] Visit from 04/27/2022 in [facility] [place] [facility] [employer]  PHQ-2 Total Score 2 4 4 1  0  PHQ-9 Total Score 9 21 21 8  --   Flowsheet Row Video Visit from 06/22/2024 in [facility] Video Visit from 05/29/2024 in [facility] Video Visit from 04/24/2024 in [facility]  C-SSRS RISK CATEGORY No Risk  Low Risk No Risk     Assessment and Plan: Jorge Wright is a 38 year old Caucasian male who has a history of bipolar disorder type II, panic attacks, alcoholism, was evaluated by telemedicine today.  Discussed assessment and plan as noted below.  Bipolar disorder type II most recent episode depressed-unstable Currently reports internal anxiety/agitation and racing thoughts.  Likely internal restlessness due to psychotropics like Abilify .  Dosage reduction did help to some extent with the symptoms although he continues to struggle with it. Taper of Abilify , advised to take 2.5 mg for a week and stopped taking it. Start Latuda 20 mg daily with breakfast after completely stopping the Abilify . Could increase Latuda to 40 mg daily with breakfast after 15 days and if well-tolerated.  Drug-induced akathisia (likely due to Abilify )-unstable Internal restlessness improved since reducing the Abilify  dosage. Discontinue Abilify  as noted above. Continue Benztropine  1 mg at bedtime as needed.  Panic disorder-stable Denies any significant panic attacks at this time.  Currently motivated to stay in psychotherapy. Continue Lexapro  at reduced dosage of 10 mg daily Higher dosage of Lexapro  caused side effects. Continue Hydroxyzine  25 mg 4 times a day as needed Encouraged continue psychotherapy sessions with Ms. Ellouise Hummer. Will coordinate care.  Insomnia-stable Currently reports sleep is overall good. Continue Rozerem  8 mg at bedtime  Alcohol use disorder in partial remission Currently denies any significant alcohol use, occasional social use only. Continue to encourage abstinence.  At risk for prolonged QT syndrome-we will order EKG.  Patient to call [phone] to get this done.  Attention and concentration deficit-referred to Dr. Metta  Follow-up in clinic in 3 to 4 weeks or sooner if needed.  Collaboration of Care: Collaboration of Care: Referral or follow-up with  counselor/therapist AEB encouraged to  continue CBT with Ms. Ellouise Hummer, will coordinate care.  Patient/Guardian was advised Release of Information must be obtained prior to any record release in order to collaborate their care with an outside provider. Patient/Guardian was advised if they have not already done so to contact the registration department to sign all necessary forms in order for us  to release information regarding their care.   Consent: Patient/Guardian gives verbal consent for treatment and assignment of benefits for services provided during this visit. Patient/Guardian expressed understanding and agreed to proceed.   This note was generated in part or whole with voice recognition software. Voice recognition is usually quite accurate but there are transcription errors that can and very often do occur. I apologize for any typographical errors that were not detected and corrected.    Scotti Motter, MD 06/22/2024, 1:54 PM

## 2024-06-22 NOTE — Patient Instructions (Addendum)
 Please call for EKG - [phone]  Lurasidone Tablets What is this medication? LURASIDONE (loo RAS i done) treats schizophrenia and bipolar depression. It works by balancing the levels of dopamine and serotonin in your brain, substances that help regulate mood, behaviors, and thoughts. It belongs to a group of medications called antipsychotics. Antipsychotic medications can be used to treat several kinds of mental health conditions. This medicine may be used for other purposes; ask your health care provider or pharmacist if you have questions. COMMON BRAND NAME(S): Latuda What should I tell my care team before I take this medication? They need to know if you have any of these conditions: Dementia Diabetes Difficulty swallowing Have trouble controlling your muscles Heart disease High cholesterol History of breast cancer History of high prolactin levels History of stroke Kidney disease Liver disease Low blood counts, like low white cell, platelet, or red cell counts Low blood pressure Parkinson's disease Seizures Suicidal thoughts, plans, or attempt; a previous suicide attempt by you or a family member An unusual or allergic reaction to lurasidone, other medications, foods, dyes, or preservatives Pregnant or trying to get pregnant Breast-feeding How should I use this medication? Take this medication by mouth with a glass of water. Follow the directions on the prescription label. Take this medication with food. Take your medication at regular intervals. Do not take it more often than directed. Do not stop taking except on your care team's advice. A special MedGuide will be given to you by the pharmacist with each prescription and refill. Be sure to read this information carefully each time. Talk to your care team about the use of this medication in children. While this medication may be prescribed for children as young as 10 years for selected conditions, precautions do  apply. Overdosage: If you think you have taken too much of this medicine contact a poison control center or emergency room at once. NOTE: This medicine is only for you. Do not share this medicine with others. What if I miss a dose? If you miss a dose, take it as soon as you can. If it is almost time for your next dose, take only that dose. Do not take double or extra doses. What may interact with this medication? Do not take this medication with any of the following: Adagrasib Avasimibe Carbamazepine Certain medications for fungal infections, such as ketoconazole, voriconazole Clarithromycin Metoclopramide Mibefradil Phenytoin Rifampin Ritonavir St. John's wort This medication may also interact with the following: Antihistamines for allergy, cough, and cold Bosentan Certain medications for anxiety or sleep Certain medications for depression, such as amitriptyline, fluoxetine, sertraline Certain medications for HIV or hepatitis Erythromycin Fluconazole General anesthetics, such as halothane, isoflurane, methoxyflurane, propofol Grapefruit juice Levodopa or other medications for Parkinson disease Medications for blood pressure Medications for seizures Medications that relax muscles for surgery Modafinil Nafcillin Opioid medications for pain Phenothiazines, such as chlorpromazine, prochlorperazine, thioridazine This list may not describe all possible interactions. Give your health care provider a list of all the medicines, herbs, non-prescription drugs, or dietary supplements you use. Also tell them if you smoke, drink alcohol, or use illegal drugs. Some items may interact with your medicine. What should I watch for while using this medication? Visit your care team for regular checks on your progress. Tell your care team if symptoms do not start to get better or if they get worse. Do not stop taking except on your care team's advice. You may develop a severe reaction. Your care  team will tell  you how much medication to take. Patients and their families should watch out for new or worsening depression or thoughts of suicide. Also watch out for sudden changes in feelings such as feeling anxious, agitated, panicky, irritable, hostile, aggressive, impulsive, severely restless, overly excited and hyperactive, or not being able to sleep. If this happens, especially at the beginning of treatment or after a change in dose, call your care team. This medication may increase blood sugar. Ask your care team if changes in diet or medications are needed if you have diabetes. You may get dizzy or drowsy. Do not drive, use machinery, or do anything that needs mental alertness until you know how this medication affects you. Do not stand or sit up quickly, especially if you are an older patient. This reduces the risk of dizzy or fainting spells. Alcohol may interfere with the effect of this medication. Avoid alcoholic drinks. This medication may cause dry eyes and blurred vision. If you wear contact lenses you may feel some discomfort. Lubricating drops may help. See your eye care team if the problem does not go away or is severe. This medication can cause problems with controlling your body temperature. It can lower the response of your body to cold temperatures. If possible, stay indoors during cold weather. If you must go outdoors, wear warm clothes. It can also lower the response of your body to heat. Do not overheat. Do not over-exercise. Stay out of the sun when possible. If you must be in the sun, wear cool clothing. Drink plenty of water. If you have trouble controlling your body temperature, call your care team right away. What side effects may I notice from receiving this medication? Side effects that you should report to your care team as soon as possible: Allergic reactions--skin rash, itching, hives, swelling of the face, lips, tongue, or throat High blood sugar  (hyperglycemia)--increased thirst or amount of urine, unusual weakness or fatigue, blurry vision High fever, stiff muscles, increased sweating, fast or irregular heartbeat, and confusion, which may be signs of neuroleptic malignant syndrome High prolactin level--unexpected breast tissue growth, discharge from the nipple, change in sex drive or performance, irregular menstrual cycle Infection--fever, chills, cough, or sore throat Low blood pressure--dizziness, feeling faint or lightheaded, blurry vision Pain or trouble swallowing Seizures Stroke--sudden numbness or weakness of the face, arm, or leg, trouble speaking, confusion, trouble walking, loss of balance or coordination, dizziness, severe headache, change in vision Thoughts of suicide or self-harm, worsening mood, feelings of depression Uncontrolled and repetitive body movements, muscle stiffness or spasms, tremors or shaking, loss of balance or coordination, restlessness, shuffling walk, which may be signs of extrapyramidal symptoms (EPS) Side effects that usually do not require medical attention (report to your care team if they continue or are bothersome): Drowsiness Nausea Restlessness Runny or stuffy nose Trouble sleeping Weight gain This list may not describe all possible side effects. Call your doctor for medical advice about side effects. You may report side effects to FDA at 1-800-FDA-1088. Where should I keep my medication? Keep out of the reach of children. Store at room temperature between 15 and 30 degrees C (59 and 86 degrees F). Throw away any unused medication after the expiration date. NOTE: This sheet is a summary. It may not cover all possible information. If you have questions about this medicine, talk to your doctor, pharmacist, or health care provider.  2024 Elsevier/Gold Standard (2021-12-24 00:00:00)

## 2024-06-25 ENCOUNTER — Other Ambulatory Visit: Payer: Self-pay | Admitting: Nurse Practitioner

## 2024-06-25 DIAGNOSIS — I152 Hypertension secondary to endocrine disorders: Secondary | ICD-10-CM

## 2024-06-27 ENCOUNTER — Ambulatory Visit: Payer: Self-pay | Admitting: Psychiatry

## 2024-06-27 ENCOUNTER — Ambulatory Visit
Admission: RE | Admit: 2024-06-27 | Discharge: 2024-06-27 | Disposition: A | Discharge status: Home / Self Care | Source: Ambulatory Visit | Attending: Psychiatry | Admitting: Psychiatry

## 2024-06-27 DIAGNOSIS — R9431 Abnormal electrocardiogram [ECG] [EKG]: Secondary | ICD-10-CM | POA: Diagnosis present

## 2024-06-27 DIAGNOSIS — Z5181 Encounter for therapeutic drug level monitoring: Secondary | ICD-10-CM | POA: Diagnosis not present

## 2024-06-27 NOTE — Telephone Encounter (Signed)
 EKG reviewed 06/27/2024.  Normal sinus rhythm.  QTc within acceptable range.  Okay to continue current medication regimen.

## 2024-06-27 NOTE — Progress Notes (Signed)
 Called patient to inform of EKG results no answer unable to leave a voicemail due to mailbox full

## 2024-06-28 NOTE — Progress Notes (Signed)
 Made another attempt to call patient no answer unable to leave a voicemail due to mailbox is full

## 2024-06-28 NOTE — Progress Notes (Signed)
 Spoke to patient inform him of the EKG results and advised him to continue his current medication regimen he voiced understanding

## 2024-07-04 ENCOUNTER — Ambulatory Visit: Admitting: Nurse Practitioner

## 2024-07-04 VITALS — BP 118/82 | HR 81 | Temp 98.7°F | Ht 72.0 in | Wt 215.8 lb

## 2024-07-04 DIAGNOSIS — Z1329 Encounter for screening for other suspected endocrine disorder: Secondary | ICD-10-CM | POA: Diagnosis not present

## 2024-07-04 DIAGNOSIS — E559 Vitamin D deficiency, unspecified: Secondary | ICD-10-CM

## 2024-07-04 DIAGNOSIS — E1165 Type 2 diabetes mellitus with hyperglycemia: Secondary | ICD-10-CM | POA: Diagnosis not present

## 2024-07-04 DIAGNOSIS — E782 Mixed hyperlipidemia: Secondary | ICD-10-CM | POA: Diagnosis not present

## 2024-07-04 DIAGNOSIS — F3181 Bipolar II disorder: Secondary | ICD-10-CM

## 2024-07-04 DIAGNOSIS — Z794 Long term (current) use of insulin: Secondary | ICD-10-CM

## 2024-07-04 DIAGNOSIS — E1159 Type 2 diabetes mellitus with other circulatory complications: Secondary | ICD-10-CM

## 2024-07-04 DIAGNOSIS — E538 Deficiency of other specified B group vitamins: Secondary | ICD-10-CM

## 2024-07-04 DIAGNOSIS — I152 Hypertension secondary to endocrine disorders: Secondary | ICD-10-CM | POA: Diagnosis not present

## 2024-07-04 MED ORDER — FENOFIBRATE 145 MG PO TABS: 145.0000 mg | ORAL_TABLET | Freq: Every day | ORAL | 3 refills | Status: AC

## 2024-07-04 MED ORDER — ATORVASTATIN CALCIUM 40 MG PO TABS
40.0000 mg | ORAL_TABLET | Freq: Every day | ORAL | 3 refills | Status: AC
Start: 2024-07-04 — End: ?

## 2024-07-04 MED ORDER — METFORMIN HCL ER 500 MG PO TB24: 1000.0000 mg | ORAL_TABLET | Freq: Every day | ORAL | 3 refills | Status: DC

## 2024-07-04 NOTE — Progress Notes (Signed)
 Jorge Glance, NP-C Phone: [phone]  Jorge Wright is a 38 y.o. male who presents today for medication refill.   Discussed the use of AI scribe software for clinical note transcription with the patient, who gave verbal consent to proceed.  History of Present Illness   Jorge Wright is a 38 year old male with diabetes who presents for management of his diabetes and medication review.  He manages his diabetes with metformin , 1000 mg once daily. He does not regularly monitor his blood sugar levels, but when he does, they are usually around 200 mg/dL. No excessive thirst, excessive urination, or hypoglycemia, although he occasionally feels his blood sugar might be low, but readings are in the 90s. His last A1c, taken a year ago, was 8.5%. He has not been able to use a continuous glucose monitor due to insurance issues.  He has a history of psychiatric treatment and is currently on Lexapro  10 mg, Latuda , and Cogentin  for sleep. He also uses hydroxyzine  as needed for anxiety. His psychiatric medications have been adjusted recently, and he underwent an EKG due to potential side effects of Latuda .  He is currently taking lisinopril  and hydrochlorothiazide  for blood pressure, which has been well-controlled. He also takes Lipitor for cholesterol management and has been out of fenofibrate  for a few months. No chest pain, shortness of breath, dizziness, or swelling.  He has a history of alcohol use but quit drinking in February 2023. He does not consume alcohol currently. His diet is described as not awful, with a preference for meats and potatoes, and he is not a big sweets person. He drinks diet soda instead of water.  He has allergies to Mounjaro , Trulicity , and Ozempic , all GLP-1 medications, and experienced adverse effects with Ozempic , possibly related to ulcerative colitis. He is also allergic to Invokana , which caused a rash.      Social History   Tobacco Use  Smoking Status Former   Smokeless Tobacco Former    Current Outpatient Medications on File Prior to Visit  Medication Sig Dispense Refill   ARIPiprazole  (ABILIFY ) 5 MG tablet Take 0.5 tablets (2.5 mg total) by mouth daily for 7 days.     benztropine  (COGENTIN ) 1 MG tablet Take 1 tablet (1 mg total) by mouth at bedtime as needed for tremors. 30 tablet 1   Continuous Blood Gluc Sensor (FREESTYLE LIBRE 3 SENSOR) MISC Apply 1 each topically every 14 (fourteen) days. Place 1 sensor on the skin every 14 days. Use to check glucose continuously 2 each 11   escitalopram  (LEXAPRO ) 10 MG tablet Take 1 tablet (10 mg total) by mouth daily. 30 tablet 1   hydrOXYzine  (VISTARIL ) 25 MG capsule Take 1 capsule (25 mg total) by mouth 4 (four) times daily as needed. For severe anxiety 120 capsule 1   lisinopril -hydrochlorothiazide  (ZESTORETIC ) 20-12.5 MG tablet TAKE 2 TABLETS DAILY IN THE MORNING 180 tablet 3   lurasidone  (LATUDA ) 40 MG TABS tablet Take 0.5 tablets (20 mg total) by mouth daily with breakfast for 15 days, THEN 1 tablet (40 mg total) daily with breakfast for 15 days. 23 tablet 0   ramelteon  (ROZEREM ) 8 MG tablet Take 1 tablet (8 mg total) by mouth at bedtime. 30 tablet 1   No current facility-administered medications on file prior to visit.     ROS see history of present illness  Objective  Physical Exam Vitals:   07/04/24 1533  BP: 118/82  Pulse: 81  Temp: 98.7 F (37.1 C)  SpO2: 97%  BP Readings from Last 3 Encounters:  07/04/24 118/82  07/01/23 118/64  03/29/23 126/83   Wt Readings from Last 3 Encounters:  07/04/24 215 lb 12.8 oz (97.9 kg)  07/01/23 214 lb 6.4 oz (97.3 kg)  03/29/23 223 lb 12.8 oz (101.5 kg)    Physical Exam Constitutional:      General: He is not in acute distress.    Appearance: Normal appearance.  HENT:     Head: Normocephalic.  Cardiovascular:     Rate and Rhythm: Normal rate and regular rhythm.     Heart sounds: Normal heart sounds.  Pulmonary:     Effort:  Pulmonary effort is normal.     Breath sounds: Normal breath sounds.  Skin:    General: Skin is warm and dry.  Neurological:     General: No focal deficit present.     Mental Status: He is alert.  Psychiatric:        Mood and Affect: Mood normal.        Behavior: Behavior normal.      Assessment/Plan: Please see individual problem list.  Type 2 diabetes mellitus with hyperglycemia, with long-term current use of insulin  (HCC) Assessment & Plan: A1c was 8.5% a year ago, indicating suboptimal control. He is currently on metformin  1000 mg once daily, with blood glucose levels around 200 mg/dL. He experiences adverse effects with GLP-1 receptor agonists. Discussed the potential need for insulin  therapy if A1c is significantly elevated. Advised a low-carb, high-protein diet. Insurance may not cover Dexcom for non-insulin  users, so will attempt Freestyle Libre 3+ for monitoring. Order A1c test, refill metformin , and attempt to prescribe Freestyle Libre 3+. Discuss potential insulin  therapy if A1c is significantly elevated.  Orders: -     metFORMIN  HCl ER; Take 2 tablets (1,000 mg total) by mouth daily with breakfast.  Dispense: 180 tablet; Refill: 3 -     Hemoglobin A1c  Bipolar II disorder, mild, depressed, with anxious distress (HCC) Assessment & Plan: He is under psychiatric care, currently on Lexapro  10 mg, Latuda  and Cogentin . Uses hydroxyzine  as needed for anxiety. Discussed potential metabolic effects of Latuda , including the risk of QT prolongation. Recent EKG- WNL. Continue current medication regimen. Follow up with Psychiatry as scheduled.    Hypertension associated with diabetes (HCC) Assessment & Plan: Blood pressure is well-controlled with lisinopril  and hydrochlorothiazide . Continue. Check lab work as outlined.   Orders: -     CBC with Differential/Platelet -     Comprehensive metabolic panel with GFR  Mixed hyperlipidemia Assessment & Plan: He is on Lipitor and  fenofibrate  but has been out of fenofibrate  for a few months. Plan to check cholesterol levels after resuming fenofibrate  for six weeks. Family history of cardiovascular disease increases the importance of managing lipid levels. Continue lipitor, refill fenofibrate  and order a fasting lipid panel in six weeks. Encourage healthy diet and regular exercise.   Orders: -     Atorvastatin  Calcium ; Take 1 tablet (40 mg total) by mouth daily.  Dispense: 90 tablet; Refill: 3 -     Fenofibrate ; Take 1 tablet (145 mg total) by mouth daily.  Dispense: 90 tablet; Refill: 3 -     Hepatic function panel; Future -     Lipid panel; Future  Vitamin B12 deficiency -     Vitamin B12  Vitamin D  deficiency -     VITAMIN D  25 Hydroxy (Vit-D Deficiency, Fractures)  Thyroid  disorder screen -     TSH     Return  in about 6 weeks (around 08/15/2024) for fasting labs then 3 month follow up.   Jorge Glance, NP-C [facility]

## 2024-07-05 LAB — CBC WITH DIFFERENTIAL/PLATELET
Basophils Absolute: 0 K/uL (ref 0.0–0.1)
Basophils Relative: 0.8 % (ref 0.0–3.0)
Eosinophils Absolute: 0 K/uL (ref 0.0–0.7)
Eosinophils Relative: 0.5 % (ref 0.0–5.0)
HCT: 38.5 % — ABNORMAL LOW (ref 39.0–52.0)
Hemoglobin: 12.7 g/dL — ABNORMAL LOW (ref 13.0–17.0)
Lymphocytes Relative: 35.3 % (ref 12.0–46.0)
Lymphs Abs: 2.2 K/uL (ref 0.7–4.0)
MCHC: 33.1 g/dL (ref 30.0–36.0)
MCV: 84.8 fl (ref 78.0–100.0)
Monocytes Absolute: 0.4 K/uL (ref 0.1–1.0)
Monocytes Relative: 6.1 % (ref 3.0–12.0)
Neutro Abs: 3.6 K/uL (ref 1.4–7.7)
Neutrophils Relative %: 57.3 % (ref 43.0–77.0)
Platelets: 219 K/uL (ref 150.0–400.0)
RBC: 4.54 Mil/uL (ref 4.22–5.81)
RDW: 14.2 % (ref 11.5–15.5)
WBC: 6.3 K/uL (ref 4.0–10.5)

## 2024-07-05 LAB — COMPREHENSIVE METABOLIC PANEL WITH GFR
ALT: 37 U/L (ref 0–53)
AST: 16 U/L (ref 0–37)
Albumin: 4.4 g/dL (ref 3.5–5.2)
Alkaline Phosphatase: 57 U/L (ref 39–117)
BUN: 15 mg/dL (ref 6–23)
CO2: 24 meq/L (ref 19–32)
Calcium: 9 mg/dL (ref 8.4–10.5)
Chloride: 103 meq/L (ref 96–112)
Creatinine, Ser: 1.09 mg/dL (ref 0.40–1.50)
GFR: 85.68 mL/min (ref >60.00)
Glucose, Bld: 130 mg/dL — ABNORMAL HIGH (ref 70–99)
Potassium: 3.8 meq/L (ref 3.5–5.1)
Sodium: 135 meq/L (ref 135–145)
Total Bilirubin: 0.6 mg/dL (ref 0.2–1.2)
Total Protein: 6.9 g/dL (ref 6.0–8.3)

## 2024-07-05 LAB — TSH: TSH: 0.66 u[IU]/mL (ref 0.35–5.50)

## 2024-07-05 LAB — HEMOGLOBIN A1C: Hgb A1c MFr Bld: 10.3 % — ABNORMAL HIGH (ref 4.6–6.5)

## 2024-07-05 LAB — VITAMIN D 25 HYDROXY (VIT D DEFICIENCY, FRACTURES): VITD: 20.38 ng/mL — ABNORMAL LOW (ref 30.00–100.00)

## 2024-07-05 LAB — VITAMIN B12: Vitamin B-12: 254 pg/mL (ref 211–911)

## 2024-07-06 ENCOUNTER — Ambulatory Visit: Payer: Self-pay | Admitting: Nurse Practitioner

## 2024-07-06 ENCOUNTER — Other Ambulatory Visit: Payer: Self-pay | Admitting: Nurse Practitioner

## 2024-07-06 DIAGNOSIS — E1165 Type 2 diabetes mellitus with hyperglycemia: Secondary | ICD-10-CM

## 2024-07-06 MED ORDER — INSULIN DEGLUDEC 100 UNIT/ML ~~LOC~~ SOPN: 10.0000 [IU] | PEN_INJECTOR | Freq: Every day | SUBCUTANEOUS | 5 refills | Status: DC

## 2024-07-11 ENCOUNTER — Encounter: Payer: Self-pay | Admitting: Nurse Practitioner

## 2024-07-11 NOTE — Assessment & Plan Note (Signed)
 He is under psychiatric care, currently on Lexapro  10 mg, Latuda  and Cogentin . Uses hydroxyzine  as needed for anxiety. Discussed potential metabolic effects of Latuda , including the risk of QT prolongation. Recent EKG- WNL. Continue current medication regimen. Follow up with Psychiatry as scheduled.

## 2024-07-11 NOTE — Assessment & Plan Note (Signed)
 A1c was 8.5% a year ago, indicating suboptimal control. He is currently on metformin  1000 mg once daily, with blood glucose levels around 200 mg/dL. He experiences adverse effects with GLP-1 receptor agonists. Discussed the potential need for insulin  therapy if A1c is significantly elevated. Advised a low-carb, high-protein diet. Insurance may not cover Dexcom for non-insulin  users, so will attempt Freestyle Libre 3+ for monitoring. Order A1c test, refill metformin , and attempt to prescribe Freestyle Libre 3+. Discuss potential insulin  therapy if A1c is significantly elevated.

## 2024-07-11 NOTE — Assessment & Plan Note (Signed)
 He is on Lipitor and fenofibrate  but has been out of fenofibrate  for a few months. Plan to check cholesterol levels after resuming fenofibrate  for six weeks. Family history of cardiovascular disease increases the importance of managing lipid levels. Continue lipitor, refill fenofibrate  and order a fasting lipid panel in six weeks. Encourage healthy diet and regular exercise.

## 2024-07-11 NOTE — Assessment & Plan Note (Signed)
 Blood pressure is well-controlled with lisinopril  and hydrochlorothiazide . Continue. Check lab work as outlined.

## 2024-07-17 ENCOUNTER — Telehealth (INDEPENDENT_AMBULATORY_CARE_PROVIDER_SITE_OTHER): Admitting: Psychiatry

## 2024-07-17 ENCOUNTER — Encounter: Payer: Self-pay | Admitting: Psychiatry

## 2024-07-17 DIAGNOSIS — F410 Panic disorder [episodic paroxysmal anxiety] without agoraphobia: Secondary | ICD-10-CM

## 2024-07-17 DIAGNOSIS — F1021 Alcohol dependence, in remission: Secondary | ICD-10-CM

## 2024-07-17 DIAGNOSIS — F3175 Bipolar disorder, in partial remission, most recent episode depressed: Secondary | ICD-10-CM

## 2024-07-17 DIAGNOSIS — G2571 Drug induced akathisia: Secondary | ICD-10-CM

## 2024-07-17 DIAGNOSIS — F3181 Bipolar II disorder: Secondary | ICD-10-CM

## 2024-07-17 MED ORDER — LURASIDONE HCL 20 MG PO TABS: 20.0000 mg | ORAL_TABLET | Freq: Two times a day (BID) | ORAL | 1 refills | Status: DC

## 2024-07-17 MED ORDER — ESCITALOPRAM OXALATE 10 MG PO TABS: 10.0000 mg | ORAL_TABLET | Freq: Every day | ORAL | 5 refills | Status: DC

## 2024-07-17 NOTE — Progress Notes (Signed)
 Virtual Visit via Video Note  I connected with Jorge Wright on 07/17/24 at  9:00 AM EDT by a video enabled telemedicine application and verified that I am speaking with the correct person using two identifiers.  Location Provider Location : [facility] Patient Location : Home  Participants: Patient , Provider   I discussed the limitations of evaluation and management by telemedicine and the availability of in person appointments. The patient expressed understanding and agreed to proceed.   I discussed the assessment and treatment plan with the patient. The patient was provided an opportunity to ask questions and all were answered. The patient agreed with the plan and demonstrated an understanding of the instructions.   The patient was advised to call back or seek an in-person evaluation if the symptoms worsen or if the condition fails to improve as anticipated.   BH MD OP Progress Note  07/17/2024 9:23 AM STEVON GOUGH  MRN:  [id number]  Chief Complaint:  Chief Complaint  Patient presents with   Follow-up   Depression   Anxiety   Medication Refill   Discussed the use of AI scribe software for clinical note transcription with the patient, who gave verbal consent to proceed.  History of Present Illness Jorge Wright is a 38 year old Caucasian male who lives in [place], going through divorce, employed, has a history of bipolar disorder, panic disorder, alcohol use disorder in remission, attention and concentration deficit, diabetes mellitus, ulcerative colitis, hyperlipidemia was evaluated by telemedicine today.  He has been taking Latuda , initially starting with 20 mg daily for fifteen days and is now transitioning to 40 mg daily. He notes a significant improvement in his symptoms, particularly the reduction of intrusive thoughts that previously occurred in the mornings. He describes these thoughts as 'trickling in and trickling out' rather than overwhelming him like a  'tsunami'.  He experiences some side effects, including increased tiredness and hot flashes, though he believes the hot flashes may have been present before starting Latuda . The tiredness is now more noticeable when he sits down in the evening or when he is not engaged in activities, rather than the midday exhaustion he previously experienced due to racing thoughts.  He confirms that he is no longer consuming alcohol and is attending therapy sessions with Ellouise, which he finds beneficial. He sees her once a month, which he feels is sufficient. He occasionally uses sleep medication as needed.  He is mindful of his appetite, noting that he has to remind himself to eat, especially to ensure he consumes enough calories before taking Latuda . He denies experiencing movement problems, tremors, stiffness, or muscle spasms.  Denies thoughts of self-harm or harm to others.   Visit Diagnosis:    ICD-10-CM   1. Bipolar disorder, in partial remission, most recent episode depressed (HCC)  F31.75 lurasidone  (LATUDA ) 20 MG TABS tablet   Type II, mild with anxious distress    2. Panic disorder  F41.0 escitalopram  (LEXAPRO ) 10 MG tablet    3. Drug induced akathisia  G25.71    Likely secondary to psychotropics-resolved    4. Alcohol use disorder, severe, in early remission (HCC)  F10.21       Past Psychiatric History: I have reviewed past psychiatric history from progress note on 10/17/2023.  Past trials of medications like Wellbutrin, Lexapro , lorazepam , trazodone.  Past Medical History:  Past Medical History:  Diagnosis Date   Chicken pox    COVID-19    12/2020 sob mild, 05/21/21  Depression    Diabetes mellitus without complication (HCC)    History of alcohol abuse    Hyperlipidemia    Hypertension    UC (ulcerative colitis) [facility])     Past Surgical History:  Procedure Laterality Date   COLONOSCOPY WITH PROPOFOL  N/A 07/28/2022   Procedure: COLONOSCOPY WITH PROPOFOL ;  Surgeon: Toledo, Ladell POUR,  MD;  Location: [facility] ENDOSCOPY;  Service: Gastroenterology;  Laterality: N/A;  OK'D PER PM   ESOPHAGOGASTRODUODENOSCOPY (EGD) WITH PROPOFOL  N/A 07/28/2022   Procedure: ESOPHAGOGASTRODUODENOSCOPY (EGD) WITH PROPOFOL ;  Surgeon: Toledo, Ladell POUR, MD;  Location: [facility] ENDOSCOPY;  Service: Gastroenterology;  Laterality: N/A;   MANDIBLE FRACTURE SURGERY      Family Psychiatric History: I have reviewed family psychiatric history from progress note on 10/17/2023.  Family History:  Family History  Problem Relation Age of Onset   Bipolar disorder Mother    Alcohol abuse Mother    Hypertension Mother    Heart disease Mother        had heart attack 1    Diabetes Mother        type 2   Heart attack Mother 30   Alcohol abuse Father    Hypertension Father    Diabetes Father        type 1   Pancreatitis Father    Heart attack Maternal Uncle        had heart attack    Alcohol abuse Maternal Grandmother    Heart disease Maternal Grandmother        died in 3s    Hypertension Maternal Grandmother    Depression Maternal Grandmother    Stroke Paternal Grandfather     Social History: I have reviewed social history from progress note on 10/17/2023. Social History   Socioeconomic History   Marital status: Legally Separated    Spouse name: Not on file   Number of children: 1   Years of education: Not on file   Highest education level: Bachelor's degree (e.g., BA, AB, BS)  Occupational History   Not on file  Tobacco Use   Smoking status: Former   Smokeless tobacco: Former  Building services engineer status: Every Day   Substances: Nicotine  Substance and Sexual Activity   Alcohol use: Yes    Alcohol/week: 6.0 standard drinks of alcohol    Types: 6 Standard drinks or equivalent per week    Comment: occasional, last drink 1 month ago, has a hx of alcoholism   Drug use: Not Currently    Types: Marijuana    Comment: occasional edibles   Sexual activity: Yes  Other Topics Concern   Not on file   Social History Narrative   Works IT    separated   Kids daughter [name]    Social Drivers of Health   Financial Resource Strain: Low Risk  (12/22/2021)   Overall Financial Resource Strain (CARDIA)    Difficulty of Paying Living Expenses: Not hard at all  Food Insecurity: Not on file  Transportation Needs: Not on file  Physical Activity: Not on file  Stress: Not on file  Social Connections: Not on file    Allergies:  Allergies  Allergen Reactions   Mounjaro  [Tirzepatide ]     Injection site reaction    Ozempic  (0.25 Or 0.5 Mg-Dose) [Semaglutide (0.25 Or 0.5mg -Dos)]     N/v/diarrhea/ab pain    Trulicity  [Dulaglutide ]     Gi Sxs   Invokana  [Canagliflozin ] Rash    Reports topical rash on trunk that occurred after  taking Invokana  for about a week. Resolved after medication discontinuation. No airway involvement.    Penicillins Rash    Metabolic Disorder Labs: Lab Results  Component Value Date   HGBA1C 10.3 (H) 07/04/2024   No results found for: PROLACTIN Lab Results  Component Value Date   CHOL 176 12/31/2022   TRIG 90.0 12/31/2022   HDL 46.60 12/31/2022   CHOLHDL 4 12/31/2022   VLDL 18.0 12/31/2022   LDLCALC 111 (H) 12/31/2022   LDLCALC 79 07/03/2019   Lab Results  Component Value Date   TSH 0.66 07/04/2024   TSH 1.25 12/31/2022    Therapeutic Level Labs: No results found for: LITHIUM No results found for: VALPROATE No results found for: CBMZ  Current Medications: Current Outpatient Medications  Medication Sig Dispense Refill   lurasidone  (LATUDA ) 20 MG TABS tablet Take 1 tablet (20 mg total) by mouth 2 (two) times daily with a meal. 60 tablet 1   atorvastatin  (LIPITOR) 40 MG tablet Take 1 tablet (40 mg total) by mouth daily. 90 tablet 3   benztropine  (COGENTIN ) 1 MG tablet Take 1 tablet (1 mg total) by mouth at bedtime as needed for tremors. 30 tablet 1   Continuous Blood Gluc Sensor (FREESTYLE LIBRE 3 SENSOR) MISC Apply 1 each topically every 14  (fourteen) days. Place 1 sensor on the skin every 14 days. Use to check glucose continuously 2 each 11   escitalopram  (LEXAPRO ) 10 MG tablet Take 1 tablet (10 mg total) by mouth daily. 30 tablet 5   fenofibrate  (TRICOR ) 145 MG tablet Take 1 tablet (145 mg total) by mouth daily. 90 tablet 3   hydrOXYzine  (VISTARIL ) 25 MG capsule Take 1 capsule (25 mg total) by mouth 4 (four) times daily as needed. For severe anxiety 120 capsule 1   insulin  degludec (TRESIBA ) 100 UNIT/ML FlexTouch Pen Inject 10 Units into the skin daily. 3 mL 5   lisinopril -hydrochlorothiazide  (ZESTORETIC ) 20-12.5 MG tablet TAKE 2 TABLETS DAILY IN THE MORNING 180 tablet 3   metFORMIN  (GLUCOPHAGE -XR) 500 MG 24 hr tablet Take 2 tablets (1,000 mg total) by mouth daily with breakfast. 180 tablet 3   ramelteon  (ROZEREM ) 8 MG tablet Take 1 tablet (8 mg total) by mouth at bedtime. 30 tablet 1   No current facility-administered medications for this visit.     Musculoskeletal: Strength & Muscle Tone: UTA Gait & Station: Seated Patient leans: N/A  Psychiatric Specialty Exam: Review of Systems  Psychiatric/Behavioral:  The patient is nervous/anxious (improving).     There were no vitals taken for this visit.There is no height or weight on file to calculate BMI.  General Appearance: Casual  Eye Contact:  Good  Speech:  Normal Rate  Volume:  Normal  Mood:  Anxious and racing thoughts improving  Affect:  Congruent  Thought Process:  Goal Directed and Descriptions of Associations: Intact  Orientation:  Full (Time, Place, and Person)  Thought Content: Logical   Suicidal Thoughts:  No  Homicidal Thoughts:  No  Memory:  Immediate;   Fair Recent;   Fair Remote;   Fair  Judgement:  Fair  Insight:  Fair  Psychomotor Activity:  Normal  Concentration:  Concentration: Fair and Attention Span: Fair  Recall:  [employer] of Knowledge: Fair  Language: Fair  Akathisia:  No  Handed:  Right  AIMS (if indicated): denies any tremors   Assets:  Communication Skills Desire for Improvement Housing Social Support Transportation  ADL's:  Intact  Cognition: WNL  Sleep:  Fair  Screenings: AIMS    Flowsheet Row Office Visit from 01/23/2024 in [facility] Office Visit from 11/14/2023 in [facility]  AIMS Total Score 0 0   GAD-7    Flowsheet Row Office Visit from 07/04/2024 in [facility] Visit from 11/14/2023 in [facility] Office Visit from 10/17/2023 in [facility] Office Visit from 07/01/2023 in [facility] Visit from 12/31/2022 in [facility]  Total GAD-7 Score 19 17 21 19 10    [employer]    Flowsheet Row Office Visit from 07/04/2024 in [facility] [employer] Visit from 11/14/2023 in [facility] Office Visit from 10/17/2023 in [facility] Office Visit from 07/01/2023 in [facility] Visit from 12/31/2022 in [facility]  PHQ-2 Total Score 4 2 4 4 1   PHQ-9 Total Score 18 9 21 21 8    Flowsheet Row Video Visit from 07/17/2024 in [facility] Video Visit from 06/22/2024 in [facility] Video Visit from 05/29/2024 in [facility]  C-SSRS RISK CATEGORY No Risk No Risk Low Risk     Assessment and Plan: Jorge Wright is a 38 year old Caucasian male who has a history of bipolar disorder type II, panic attacks, alcoholism was evaluated by telemedicine today.  Discussed assessment and plan as noted below.  Bipolar disorder type  II most recent episode depressed with anxious distress in partial remission Currently reports since the addition of Latuda  his mood symptoms have improved and racing thoughts as better.  Denies internal restlessness that he had with Abilify  anymore. Continue Latuda  40 mg daily in divided dosage with the meal.( EKG 06/27/2024- NSR QTc - 403)  Drug-induced akathisia likely due to Abilify -in remission Currently reports internal restlessness have resolved. Continue Benztropine  1 mg at bedtime as needed  Panic disorder-stable Denies any recent panic attacks and reports overall anxiety has improved. Continue Lexapro  at reduced dose of 10 mg daily Higher dosage of Lexapro  caused side effects. Continue Hydroxyzine  25 mg 4 times a day as needed Continue psychotherapy sessions with Ms. Ellouise Hummer  Insomnia-stable Currently reports sleep is overall good. Continue Rozerem  8 mg at bedtime.  Uses it as needed only.  Alcohol use disorder in partial remission Occasional social use of alcohol only. Will monitor closely  Follow-up Follow-up in clinic in 2 to 3 months or sooner if needed.  Collaboration of Care: Collaboration of Care: Referral or follow-up with counselor/therapist AEB encouraged to continue psychotherapy sessions, will coordinate care.  Patient/Guardian was advised Release of Information must be obtained prior to any record release in order to collaborate their care with an outside provider. Patient/Guardian was advised if they have not already done so to contact the registration department to sign all necessary forms in order for us  to release information regarding their care.   Consent: Patient/Guardian gives verbal consent for treatment and assignment of benefits for services provided during this visit. Patient/Guardian expressed understanding and agreed to proceed.  This note was generated in part or whole with voice recognition software. Voice recognition is usually quite accurate  but there are transcription errors that can and very often do occur. I apologize for any typographical errors  that were not detected and corrected.     Booker Bhatnagar, MD 07/17/2024, 9:23 AM

## 2024-08-06 ENCOUNTER — Telehealth: Payer: Self-pay

## 2024-08-06 NOTE — Telephone Encounter (Signed)
 Received fax requesting a prior authorization for the patients Lurasidone  20 mg tablet initiated via CoverMyMeds received a message no coverage found called patient to verify insurance patient stated that he currently has [place] which is what I had he stated that he would call his insurance and would call back to the office after speaking to his insurance

## 2024-08-15 ENCOUNTER — Other Ambulatory Visit (INDEPENDENT_AMBULATORY_CARE_PROVIDER_SITE_OTHER)

## 2024-08-15 DIAGNOSIS — E782 Mixed hyperlipidemia: Secondary | ICD-10-CM

## 2024-08-15 LAB — LIPID PANEL
Cholesterol: 154 mg/dL (ref 0–200)
HDL: 43.8 mg/dL (ref >39.00)
LDL Cholesterol: 96 mg/dL (ref 0–99)
NonHDL: 110.06
Total CHOL/HDL Ratio: 4
Triglycerides: 68 mg/dL (ref 0.0–149.0)
VLDL: 13.6 mg/dL (ref 0.0–40.0)

## 2024-08-15 LAB — HEPATIC FUNCTION PANEL
ALT: 16 U/L (ref 0–53)
AST: 11 U/L (ref 0–37)
Albumin: 4.2 g/dL (ref 3.5–5.2)
Alkaline Phosphatase: 59 U/L (ref 39–117)
Bilirubin, Direct: 0.1 mg/dL (ref 0.0–0.3)
Total Bilirubin: 0.4 mg/dL (ref 0.2–1.2)
Total Protein: 7 g/dL (ref 6.0–8.3)

## 2024-08-21 ENCOUNTER — Telehealth: Payer: Self-pay

## 2024-08-21 NOTE — Telephone Encounter (Signed)
 reeived fax that a prior shara is needed for the lurasidone .

## 2024-08-21 NOTE — Telephone Encounter (Signed)
 went online and submitted the prior auth- pending.

## 2024-08-28 ENCOUNTER — Telehealth: Payer: Self-pay

## 2024-08-28 NOTE — Telephone Encounter (Signed)
 I will recommend a sooner appointment for further medication changes.  However while he waits for the appointment to happen he could go up on the Lexapro  to 15 mg, he could use his Lexapro  10 mg tablets and take 1.5 tablets to make it Lexapro  15 mg.  I have already communicated with staff to schedule this patient for a sooner visit for further medication management.  So I will not be sending a Lexapro  20 mg yet.  Please let patient know to let us  know before he completely runs out of his medication in the future.

## 2024-08-28 NOTE — Telephone Encounter (Signed)
 pt left a message that he been out of the latuda  for about a week and that he needed the lexapro  increased to 20mg .

## 2024-08-28 NOTE — Telephone Encounter (Signed)
 pt was notified of the new instructions per dr. coby order. pt agreeded.

## 2024-08-28 NOTE — Telephone Encounter (Signed)
 pt was called and told that i did speak with Rosina and she stated that she was going to get medications ready for him. i explained that i sent provider a message about the increase of the lexapro  but that rosina stated she going to go ahead and get both ready and if provider does increase the dosage they can put the 10mg  back on the shelf. He was ok with it.  I told him that once I hear back from provider that I will notify him.

## 2024-08-28 NOTE — Telephone Encounter (Signed)
 called pharmacy spoke with [facility] . she stated that they have the latuda  on file so she stated that she will get that ready and she also stated that they had the latuda  10mg . she stated that she will go ahead and get medications ready   I did let ashley know that pt was requesting a increase of the lexapro  to 20mg  but I have sent provider a message in regards to that .  She stated that she will go ahead and do both just in case that it is not but if it is just have provider put on the new rx to cancel the 10mg .

## 2024-09-05 ENCOUNTER — Telehealth (HOSPITAL_COMMUNITY): Payer: Self-pay | Admitting: Psychiatry

## 2024-09-05 ENCOUNTER — Ambulatory Visit (INDEPENDENT_AMBULATORY_CARE_PROVIDER_SITE_OTHER): Admitting: Psychiatry

## 2024-09-05 ENCOUNTER — Encounter: Payer: Self-pay | Admitting: Psychiatry

## 2024-09-05 ENCOUNTER — Other Ambulatory Visit: Payer: Self-pay

## 2024-09-05 VITALS — BP 145/98 | HR 102 | Temp 97.2°F | Ht 72.0 in | Wt 212.2 lb

## 2024-09-05 DIAGNOSIS — F410 Panic disorder [episodic paroxysmal anxiety] without agoraphobia: Secondary | ICD-10-CM

## 2024-09-05 DIAGNOSIS — F3181 Bipolar II disorder: Secondary | ICD-10-CM

## 2024-09-05 DIAGNOSIS — F1021 Alcohol dependence, in remission: Secondary | ICD-10-CM | POA: Diagnosis not present

## 2024-09-05 MED ORDER — ESCITALOPRAM OXALATE 20 MG PO TABS: 20.0000 mg | ORAL_TABLET | Freq: Every day | ORAL | 1 refills | Status: DC

## 2024-09-05 MED ORDER — LURASIDONE HCL 40 MG PO TABS: 40.0000 mg | ORAL_TABLET | Freq: Every day | ORAL | 0 refills | Status: DC

## 2024-09-05 NOTE — Telephone Encounter (Signed)
 insurance would not pay for more that one pill so dr. vista sent in latuda  40mg  and when i called pharmacy back she stated that they received and it will be $10

## 2024-09-05 NOTE — Patient Instructions (Signed)
  If in a crisis walk in to : [email or web address] [address] [place], [place] [address]   [phone]   If you are experiencing a medical or mental health crisis, please call 911, [phone], or go to your nearest emergency room for immediate assistance. You can also contact  [facility] crisis unit [phone].[facility] at [phone] to be assessed over the phone or in person.

## 2024-09-05 NOTE — Progress Notes (Unsigned)
 This note is not being shared with the patient for the following reason: To prevent harm (release of this note would result in harm to the life or physical safety of the patient or another).   BH MD OP Progress Note  09/05/2024 12:53 PM Jorge Wright  MRN:  [id number]  Chief Complaint:  Chief Complaint  Patient presents with   Follow-up   Anxiety   Depression   Medication Refill   Discussed the use of AI scribe software for clinical note transcription with the patient, who gave verbal consent to proceed.  History of Present Illness Jorge Wright is a 38 year old Caucasian male who lives in [place], employed, currently going through divorce, has a history of bipolar disorder, panic disorder, alcohol use disorder in early remission, diabetes mellitus, ulcerative colitis, hyperlipidemia was evaluated in office today for a follow-up appointment.  Over the past 3 to 4 weeks, he has experienced significant depressive symptoms, including low motivation, feeling overwhelmed, difficulty getting out of bed, and struggling to function at work. He describes each day as a battle and notes persistent feelings of failure and being behind at work, even after a recent positive meeting with his supervisor. Nearly every day, he feels tired or has little energy, experiences poor concentration, and has decreased appetite, sometimes eating only 1 meal per day. He also reports passive thoughts that things would be easier if he were not present and feeling tired of fighting, but denies ever having thoughts of killing himself or making any plans to do so.  Ongoing anxiety affects him, characterized by a sense of impending doom and worry that the world's crashing down on him. He finds it difficult to manage these worries and feels uncertain that everything will be okay.  He reports that he had been taking Latuda  until the prescription was not refilled due to insurance authorization issues, and since  running out, he has not taken Latuda . Since running out, he has not taken Latuda . He currently takes Lexapro  20 mg daily at lunch, after previously reducing to 10 mg but returning to 20 mg. He also takes ramelteon  8 mg as needed for sleep, which he finds helpful when not drinking. During his recent period of alcohol use, his sleep was disrupted, but he reports improvement since stopping alcohol.  He describes a relapse in alcohol use that began when he was feeling well, escalating to drinking approximately a pint of alcohol daily for about a week. He states that this did not cause problems at work but negatively affected his family relationships, particularly with his daughter, as his ex-wife restricted contact after smelling alcohol on him. For the past 3 weeks, he has maintained sobriety, acknowledges occasional cravings, but does not want to drink, recognizing that alcohol worsens his symptoms.  His parents and girlfriend serve as his primary support system, and he spends most of his time with his girlfriend, feeling supported by her. He continues to see his therapist, Ellouise Hummer, every 2 weeks and finds these sessions helpful.    Visit Diagnosis:    ICD-10-CM   1. Bipolar 2 disorder, major depressive episode (HCC)  F31.81 lurasidone  (LATUDA ) 40 MG TABS tablet   severe ,with anxious distress    2. Panic disorder  F41.0 escitalopram  (LEXAPRO ) 20 MG tablet    3. Alcohol use disorder, severe, in early remission (HCC)  F10.21       Past Psychiatric History: I have reviewed past psychiatric history from progress note on 10/17/2023.  Past  trials of medications Wellbutrin, Lexapro , trazodone  Past Medical History:  Past Medical History:  Diagnosis Date   Chicken pox    COVID-19    12/2020 sob mild, 05/21/21   Depression    Diabetes mellitus without complication (HCC)    History of alcohol abuse    Hyperlipidemia    Hypertension    UC (ulcerative colitis) (HCC)     Past Surgical  History:  Procedure Laterality Date   COLONOSCOPY WITH PROPOFOL  N/A 07/28/2022   Procedure: COLONOSCOPY WITH PROPOFOL ;  Surgeon: Toledo, Ladell POUR, MD;  Location: [facility] ENDOSCOPY;  Service: Gastroenterology;  Laterality: N/A;  OK'D PER PM   ESOPHAGOGASTRODUODENOSCOPY (EGD) WITH PROPOFOL  N/A 07/28/2022   Procedure: ESOPHAGOGASTRODUODENOSCOPY (EGD) WITH PROPOFOL ;  Surgeon: Toledo, Ladell POUR, MD;  Location: [facility] ENDOSCOPY;  Service: Gastroenterology;  Laterality: N/A;   MANDIBLE FRACTURE SURGERY      Family Psychiatric History: I have reviewed family psychiatric history from progress note on 10/17/2023  Family History:  Family History  Problem Relation Age of Onset   Bipolar disorder Mother    Alcohol abuse Mother    Hypertension Mother    Heart disease Mother        had heart attack 27    Diabetes Mother        type 2   Heart attack Mother 57   Alcohol abuse Father    Hypertension Father    Diabetes Father        type 1   Pancreatitis Father    Heart attack Maternal Uncle        had heart attack    Alcohol abuse Maternal Grandmother    Heart disease Maternal Grandmother        died in 90s    Hypertension Maternal Grandmother    Depression Maternal Grandmother    Stroke Paternal Grandfather     Social History: I have reviewed social history from progress note on 10/17/2023. Social History   Socioeconomic History   Marital status: Legally Separated    Spouse name: Not on file   Number of children: 1   Years of education: Not on file   Highest education level: Bachelor's degree (e.g., BA, AB, BS)  Occupational History   Not on file  Tobacco Use   Smoking status: Former   Smokeless tobacco: Former  Building services engineer status: Every Day   Substances: Nicotine  Substance and Sexual Activity   Alcohol use: Yes    Alcohol/week: 6.0 standard drinks of alcohol    Types: 6 Standard drinks or equivalent per week    Comment: occasional, last drink 1 month ago, has a hx of  alcoholism   Drug use: Not Currently    Types: Marijuana    Comment: occasional edibles   Sexual activity: Yes  Other Topics Concern   Not on file  Social History Narrative   Works IT    separated   Kids daughter [name]    Social Drivers of Health   Financial Resource Strain: Low Risk  (12/22/2021)   Overall Financial Resource Strain (CARDIA)    Difficulty of Paying Living Expenses: Not hard at all  Food Insecurity: Not on file  Transportation Needs: Not on file  Physical Activity: Not on file  Stress: Not on file  Social Connections: Not on file    Allergies:  Allergies  Allergen Reactions   Mounjaro  [Tirzepatide ]     Injection site reaction    Ozempic  (0.25 Or 0.5 Mg-Dose) [Semaglutide (0.25 Or  0.5mg -Dos)]     N/v/diarrhea/ab pain    Trulicity  [Dulaglutide ]     Gi Sxs   Invokana  [Canagliflozin ] Rash    Reports topical rash on trunk that occurred after taking Invokana  for about a week. Resolved after medication discontinuation. No airway involvement.    Penicillins Rash    Metabolic Disorder Labs: Lab Results  Component Value Date   HGBA1C 10.3 (H) 07/04/2024   No results found for: PROLACTIN Lab Results  Component Value Date   CHOL 154 08/15/2024   TRIG 68.0 08/15/2024   HDL 43.80 08/15/2024   CHOLHDL 4 08/15/2024   VLDL 13.6 08/15/2024   LDLCALC 96 08/15/2024   LDLCALC 111 (H) 12/31/2022   Lab Results  Component Value Date   TSH 0.66 07/04/2024   TSH 1.25 12/31/2022    Therapeutic Level Labs: No results found for: LITHIUM No results found for: VALPROATE No results found for: CBMZ  Current Medications: Current Outpatient Medications  Medication Sig Dispense Refill   escitalopram  (LEXAPRO ) 20 MG tablet Take 1 tablet (20 mg total) by mouth daily. 30 tablet 1   lurasidone  (LATUDA ) 40 MG TABS tablet Take 1 tablet (40 mg total) by mouth daily with breakfast. 30 tablet 0   atorvastatin  (LIPITOR) 40 MG tablet Take 1 tablet (40 mg total) by  mouth daily. 90 tablet 3   benztropine  (COGENTIN ) 1 MG tablet Take 1 tablet (1 mg total) by mouth at bedtime as needed for tremors. 30 tablet 1   Continuous Blood Gluc Sensor (FREESTYLE LIBRE 3 SENSOR) MISC Apply 1 each topically every 14 (fourteen) days. Place 1 sensor on the skin every 14 days. Use to check glucose continuously 2 each 11   fenofibrate  (TRICOR ) 145 MG tablet Take 1 tablet (145 mg total) by mouth daily. 90 tablet 3   hydrOXYzine  (VISTARIL ) 25 MG capsule Take 1 capsule (25 mg total) by mouth 4 (four) times daily as needed. For severe anxiety 120 capsule 1   insulin  degludec (TRESIBA ) 100 UNIT/ML FlexTouch Pen Inject 10 Units into the skin daily. 3 mL 5   lisinopril -hydrochlorothiazide  (ZESTORETIC ) 20-12.5 MG tablet TAKE 2 TABLETS DAILY IN THE MORNING 180 tablet 3   metFORMIN  (GLUCOPHAGE -XR) 500 MG 24 hr tablet Take 2 tablets (1,000 mg total) by mouth daily with breakfast. 180 tablet 3   ramelteon  (ROZEREM ) 8 MG tablet Take 1 tablet (8 mg total) by mouth at bedtime. 30 tablet 1   No current facility-administered medications for this visit.     Musculoskeletal: Strength & Muscle Tone: within normal limits Gait & Station: normal Patient leans: N/A  Psychiatric Specialty Exam: Review of Systems  Psychiatric/Behavioral:  Positive for decreased concentration and dysphoric mood. The patient is nervous/anxious.     Blood pressure (!) 145/98, pulse (!) 102, temperature (!) 97.2 F (36.2 C), temperature source Temporal, height 6' (1.829 m), weight 212 lb 3.2 oz (96.3 kg).Body mass index is 28.78 kg/m.  General Appearance: Casual  Eye Contact:  Fair  Speech:  Clear and Coherent  Volume:  Normal  Mood:  Anxious and Depressed  Affect:  Depressed  Thought Process:  Goal Directed and Descriptions of Associations: Intact  Orientation:  Full (Time, Place, and Person)  Thought Content: Logical   Suicidal Thoughts:  No currently denies any actual intent however reports recent thoughts  of ' being better off not being here' although denies any actual plan.  Homicidal Thoughts:  No  Memory:  Immediate;   Fair Recent;   Fair Remote;  Fair  Judgement:  Fair  Insight:  Fair  Psychomotor Activity:  Normal  Concentration:  Concentration: Fair and Attention Span: Fair  Recall:  [employer] of Knowledge: Fair  Language: Fair  Akathisia:  No  Handed:  Right  AIMS (if indicated): not done  Assets:  Manufacturing systems engineer Desire for Improvement Housing Social Support Transportation  ADL's:  Intact  Cognition: WNL  Sleep:  Fair   Screenings: Midwife Visit from 01/23/2024 in [facility] Office Visit from 11/14/2023 in [facility]  AIMS Total Score 0 0   GAD-7    Flowsheet Row Office Visit from 09/05/2024 in [facility] Office Visit from 07/04/2024 in [facility] [employer] Visit from 11/14/2023 in [facility] Office Visit from 10/17/2023 in [facility] Office Visit from 07/01/2023 in [facility]  Total GAD-7 Score 20 19 17 21 19    PHQ2-9    Flowsheet Row Office Visit from 09/05/2024 in [facility] Office Visit from 07/04/2024 in [facility] Visit from 11/14/2023 in [facility] Office Visit from 10/17/2023 in [facility] Office Visit from 07/01/2023 in [facility] [place]  PHQ-2 Total Score 6 4 2 4 4   PHQ-9 Total Score 21 18 9 21 21    Flowsheet Row Office Visit from 09/05/2024 in [facility] Video Visit from 07/17/2024 in [facility] Video Visit from 06/22/2024 in [facility]  C-SSRS RISK CATEGORY Low Risk No Risk No Risk     Assessment and Plan: Jorge Wright is a 38 year old Caucasian male who has a history of bipolar disorder, panic attacks, alcoholism was evaluated in office today.  Discussed assessment and plan as noted below.  Bipolar type II most recent episode depressed with anxious distress-unstable Currently with worsening mood symptoms, noncompliant on Latuda  due to insurance problem.  Interested in restarting Latuda .  Agrees to referral to intensive outpatient program.  Declines referral to [facility] program due to the need for in person appointments. Referral to IOP program with [place], I have communicated with IOP coordinator. Restart Latuda  40 mg daily divided dosage with the meal. Continue Rozerem  8 mg at bedtime. Continue Benztropine  1 mg at bedtime as needed for side effects of Latuda   Panic disorder-unstable Recently with worsening anxiety symptoms due to situational stressors, relapse on alcohol.  Self increased Lexapro  to 20 mg which he believes is beneficial. Continue psychotherapy sessions with Ms. Ellouise Hummer Continue Lexapro  20 mg daily recently uptitrated. Continue Hydroxyzine  25 mg 4 times a day as needed  Alcohol use disorder in partial remission Recently relapsed on alcohol, currently sober.  Agrees to establish care with AA and work with individual therapist. Declines referral to [facility] IOP program. Patient to continue to follow-up with Ms. Ellouise Hummer for individual therapy. Declines initiation of a medication for alcohol craving at this time.  Follow-up Follow-up in clinic after completing IOP in 2 to 3 weeks from now.   Collaboration of Care: Collaboration of Care: Referral or follow-up with counselor/therapist AEB I have coordinated care with Ms. Ellouise Hummer.  Patient encouraged to continue psychotherapy  sessions.  Patient/Guardian was advised Release of  Information must be obtained prior to any record release in order to collaborate their care with an outside provider. Patient/Guardian was advised if they have not already done so to contact the registration department to sign all necessary forms in order for us  to release information regarding their care.   Consent: Patient/Guardian gives verbal consent for treatment and assignment of benefits for services provided during this visit. Patient/Guardian expressed understanding and agreed to proceed.   This note was generated in part or whole with voice recognition software. Voice recognition is usually quite accurate but there are transcription errors that can and very often do occur. I apologize for any typographical errors that were not detected and corrected.    Kambria Grima, MD 09/06/2024, 12:53 PM

## 2024-09-05 NOTE — Telephone Encounter (Signed)
 D:  Dr. Coby referred pt to virtual MH-IOP.  A:  Placed call and oriented pt.  CCA scheduled for tomorrow at 9:30 a.m; start virtual MH-IOP on 09-10-24.  Inform Dr. Coby.  Encouraged pt to verify his insurance benefits.  R:  Pt receptive.

## 2024-09-06 ENCOUNTER — Other Ambulatory Visit (HOSPITAL_COMMUNITY): Admitting: Psychiatry

## 2024-09-06 ENCOUNTER — Encounter (HOSPITAL_COMMUNITY): Payer: Self-pay

## 2024-09-06 ENCOUNTER — Telehealth (HOSPITAL_COMMUNITY): Payer: Self-pay | Admitting: Psychiatry

## 2024-09-06 NOTE — Telephone Encounter (Signed)
 D:  MH-IOP Case Mgr was scheduled to do a CCA with pt this morning at 9:30 a.m.[name]  Pt logged in and stated that he changed his mind about attending virtual MH-IOP.  I spoke with my parents and I just don't have the time to do group at this moment.  I have a conference coming up and we are very busy during this time at work.  Case mgr inquired about pt's support system.  According to pt, his support includes his parents, girlfriend, Dr. Coby and Ellouise Hummer, LCSW.  Pt denies SI/HI or A/V hallucinations.  I feel that if they get my medications right, I will be ok.  Pt states he should be picking up his Latuda  from [facility] today.  A:  Provided pt with support.  Encouraged pt to call this case manager if he changes his mind about attending virtual MH-IOP.  Will inform Dr. Eappen.  R:  Pt receptive.

## 2024-09-10 ENCOUNTER — Other Ambulatory Visit (HOSPITAL_COMMUNITY)

## 2024-09-11 ENCOUNTER — Ambulatory Visit (HOSPITAL_COMMUNITY)

## 2024-09-12 ENCOUNTER — Ambulatory Visit (HOSPITAL_COMMUNITY)

## 2024-09-13 ENCOUNTER — Ambulatory Visit (HOSPITAL_COMMUNITY)

## 2024-09-13 ENCOUNTER — Ambulatory Visit: Admitting: Psychiatry

## 2024-09-14 ENCOUNTER — Ambulatory Visit (HOSPITAL_COMMUNITY)

## 2024-09-17 ENCOUNTER — Ambulatory Visit (HOSPITAL_COMMUNITY)

## 2024-09-18 ENCOUNTER — Ambulatory Visit (HOSPITAL_COMMUNITY)

## 2024-09-18 ENCOUNTER — Telehealth: Admitting: Psychiatry

## 2024-09-19 ENCOUNTER — Ambulatory Visit (HOSPITAL_COMMUNITY)

## 2024-09-20 ENCOUNTER — Ambulatory Visit (HOSPITAL_COMMUNITY)

## 2024-09-21 ENCOUNTER — Ambulatory Visit (HOSPITAL_COMMUNITY)

## 2024-09-24 ENCOUNTER — Ambulatory Visit (HOSPITAL_COMMUNITY)

## 2024-09-25 ENCOUNTER — Ambulatory Visit (HOSPITAL_COMMUNITY)

## 2024-09-26 ENCOUNTER — Ambulatory Visit (HOSPITAL_COMMUNITY)

## 2024-09-27 ENCOUNTER — Ambulatory Visit (INDEPENDENT_AMBULATORY_CARE_PROVIDER_SITE_OTHER): Admitting: Psychiatry

## 2024-09-27 ENCOUNTER — Encounter: Payer: Self-pay | Admitting: Psychiatry

## 2024-09-27 ENCOUNTER — Ambulatory Visit (HOSPITAL_COMMUNITY)

## 2024-09-27 VITALS — BP 127/78 | HR 72 | Temp 97.8°F | Ht 73.0 in | Wt 209.0 lb

## 2024-09-27 DIAGNOSIS — F410 Panic disorder [episodic paroxysmal anxiety] without agoraphobia: Secondary | ICD-10-CM

## 2024-09-27 DIAGNOSIS — F1021 Alcohol dependence, in remission: Secondary | ICD-10-CM | POA: Diagnosis not present

## 2024-09-27 DIAGNOSIS — F3181 Bipolar II disorder: Secondary | ICD-10-CM

## 2024-09-27 MED ORDER — HYDROXYZINE PAMOATE 25 MG PO CAPS: 25.0000 mg | ORAL_CAPSULE | Freq: Four times a day (QID) | ORAL | 2 refills | Status: AC | PRN

## 2024-09-27 MED ORDER — RAMELTEON 8 MG PO TABS: 8.0000 mg | ORAL_TABLET | Freq: Every day | ORAL | 4 refills | Status: AC

## 2024-09-27 MED ORDER — AUVELITY 45-105 MG PO TBCR: 1.0000 | EXTENDED_RELEASE_TABLET | Freq: Every morning | ORAL | 1 refills | Status: DC

## 2024-09-27 MED ORDER — ESCITALOPRAM OXALATE 10 MG PO TABS: 10.0000 mg | ORAL_TABLET | Freq: Every day | ORAL | 1 refills | Status: DC

## 2024-09-27 MED ORDER — LURASIDONE HCL 40 MG PO TABS: 40.0000 mg | ORAL_TABLET | Freq: Every day | ORAL | 1 refills | Status: DC

## 2024-09-27 NOTE — Patient Instructions (Signed)
 Dextromethorphan; Bupropion Extended-Release Tablets What is this medication? DEXTROMETHORPHAN; BUPROPION (dex troe meth OR fan; byoo PROE pee on) treats depression. It works by balancing substances in your brain that help regulate mood. It is a combination of dextromethorphan and an NDRI. This medicine may be used for other purposes; ask your health care provider or pharmacist if you have questions. COMMON BRAND NAME(S): AUVELITY What should I tell my care team before I take this medication? They need to know if you have any of these conditions: An eating disorder, such as anorexia or bulimia Bipolar disorder Brain or spine tumor Diabetes treated with medications Frequently drink alcohol Glaucoma Heart disease, previous heart attack, or irregular heart beat Head injury High blood pressure History of substance use disorder Kidney disease Liver disease Low blood sugar Low levels of sodium in the blood Schizophrenia Seizures Stroke Suicidal thoughts, plans, or attempt by you or a family member Weight loss An unusual or allergic reaction to bupropion, dextromethorphan, other medications, foods, dyes, or preservatives Pregnant or trying to become pregnant Breastfeeding How should I use this medication? Take this medication by mouth with a glass of water. Follow the directions on the prescription label. Do not cut, crush, or chew this medication. Swallow the tablets whole. You can take this medication with or without food. If it upsets your stomach, take it with food. Take your medication at regular intervals. Do not take it more often than directed. Do not stop taking this medication except on your care team's advice. Stopping this medication too quickly may cause serious side effects or worsen your condition. A special MedGuide will be given to you by the pharmacist with each prescription and refill. Be sure to read this information carefully each time. Talk to your care team about the  use of this medication in children. Special care may be needed. Overdosage: If you think you have taken too much of this medicine contact a poison control center or emergency room at once. NOTE: This medicine is only for you. Do not share this medicine with others. What if I miss a dose? If you miss a dose, skip it. Take your next dose at the normal time. Do not take extra or 2 doses at the same time to make up for the missed dose. What may interact with this medication? Do not take this medication with any of the following: Linezolid MAOIs, such as Azilect, Carbex, Eldepryl, Marplan, Nardil, and Parnate Methylene blue (injected into a vein) Other medications that contain bupropion, such as Wellbutrin or Zyban Other medications that contain dextromethorphan, such as Robitussin or Delsym This medication may also interact with the following: Alcohol Certain medications for anxiety or sleep Certain medications for blood pressure, such as metoprolol or propranolol Certain medications for depression or other mental health conditions Certain medications for HIV or AIDS, such as efavirenz, lopinavir, nelfinavir, ritonavir Certain medications for irregular heartbeat, such as propafenone or flecainide Certain medications for Parkinson disease, such as amantadine or levodopa Certain medications for seizures, such as carbamazepine, phenytoin, phenobarbital Cimetidine Clopidogrel Cyclophosphamide Digoxin Furazolidone Isoniazid Nicotine Orphenadrine Procarbazine Steroid medications, such as prednisone or cortisone Stimulant medications for attention disorders, weight loss, or to stay awake Tamoxifen Theophylline Thiotepa Ticlopidine Tramadol Warfarin This list may not describe all possible interactions. Give your health care provider a list of all the medicines, herbs, non-prescription drugs, or dietary supplements you use. Also tell them if you smoke, drink alcohol, or use illegal drugs.  Some items may interact with your medicine.  What should I watch for while using this medication? Visit your care team for regular checks on your progress. Tell your care team if your symptoms do not get better or if they get worse. Because it may take several weeks to see the full effects of this medication, it is important to continue your treatment as prescribed. Watch for new or worsening thoughts of suicide or depression. This includes sudden changes in mood, behaviors, or thoughts. These changes can happen at any time but are more common in the beginning of treatment or after a change in dose. Call your care team right away if you experience these thoughts or worsening depression. Manic episodes may happen in patients with bipolar disorder who take this medication. Watch for changes in feelings or behaviors such as feeling anxious, nervous, agitated, panicky, irritable, hostile, aggressive, impulsive, severely restless, overly excited and hyperactive, or trouble sleeping. These symptoms can happen at anytime but are more common in the beginning of treatment or after a change in dose. Call your care team right away if you notice any of these symptoms. This medication may cause serious skin reactions. They can happen weeks to months after starting the medication. Contact your care team right away if you notice fevers or flu-like symptoms with a rash. The rash may be red or purple and then turn into blisters or peeling of the skin. Or, you might notice a red rash with swelling of the face, lips or lymph nodes in your neck or under your arms. Avoid drinks that contain alcohol while taking this medication. Drinking large amounts of alcohol, using sleeping or anxiety medications, or quickly stopping the use of these agents while taking this medication may increase your risk for a seizure. Do not drive or use heavy machinery until you know how this medication affects you. This medication can impair your ability  to perform these tasks. Your mouth may get dry. Chewing sugarless gum or sucking hard candy and drinking plenty of water may help. Contact your care team if the problem does not go away or is severe. What side effects may I notice from receiving this medication? Side effects that you should report to your care team as soon as possible: Allergic reactions--skin rash, itching, hives, swelling of the face, lips, tongue, or throat Increase in blood pressure Mood and behavior changes--anxiety, nervousness, confusion, hallucinations, irritability, hostility, thoughts of suicide or self-harm, worsening mood, feelings of depression Redness, blistering, peeling, or loosening of the skin, including inside the mouth Seizures Sudden eye pain or change in vision such as blurry vision, seeing halos around lights, vision loss Side effects that usually do not require medical attention (report these to your care team if they continue or are bothersome): Change in sex drive or performance Diarrhea Dizziness Drowsiness Dry mouth Excessive sweating Headache This list may not describe all possible side effects. Call your doctor for medical advice about side effects. You may report side effects to FDA at 1-800-FDA-1088. Where should I keep my medication? Keep out of the reach of children and pets. Store between 20 and 25 degrees C (68 and 77 degrees F). Keep this medication in the original container until you are ready to take it. Get rid of any unused medication after the expiration date. To get rid of medications that are no longer needed or have expired: Take the medication to a medication take-back program. Check with your pharmacy or law enforcement to find a location. If you cannot return the medication, check the  label or package insert to see if the medication should be thrown out in the garbage or flushed down the toilet. If you are not sure, ask your care team. If it is safe to put it in the trash, take  the medication out of the container. Mix the medication with cat litter, dirt, coffee grounds, or other unwanted substance. Seal the mixture in a bag or container. Put it in the trash. NOTE: This sheet is a summary. It may not cover all possible information. If you have questions about this medicine, talk to your doctor, pharmacist, or health care provider.  2024 Elsevier/Gold Standard (2023-06-15 00:00:00)

## 2024-09-27 NOTE — Progress Notes (Signed)
 [facility] MD OP Progress Note  09/27/2024 2:36 PM Jorge Wright  MRN:  [id number]  Chief Complaint:  Chief Complaint  Patient presents with   Follow-up   Anxiety   Depression   Manic Behavior   Alcohol Problem   Medication Refill   Discussed the use of AI scribe software for clinical note transcription with the patient, who gave verbal consent to proceed.  History of Present Illness Jorge Wright is a 38 year old Caucasian male who lives in [place], employed, currently going through divorce, has a history of bipolar disorder, panic disorder, alcohol use disorder in early remission, diabetes mellitus, ulcerative colitis, hyperlipidemia was evaluated in office today for a follow-up appointment.  He reports feeling slightly better overall since the last visit, though he describes today as more difficult, with low mood, decreased motivation, difficulty focusing, and challenges at work. For the most part, he notes improvement. He continues weekly therapy sessions with Jorge Wright and finds these helpful. He denies experiencing recent panic attacks.  He continues to have problematic sleep, as he goes to bed early but wakes up multiple times during the night and often feels tired during the day. He takes ramelteon  for sleep, which helps somewhat, though he still wakes up early, typically around 5 a.m.  He currently takes Latuda  40 mg, which he restarted after a previous lapse, and Lexapro  20 mg. He notes that resuming Latuda  40 mg has coincided with improvement in his depression. He also uses hydroxyzine  as needed and has not required benztropine  for side effects. He takes his medications as prescribed and denies side effects.  He reports abstaining from alcohol for almost 2 weeks, with his last drink being a pint nearly a week and a half ago. He attends Alcoholics Anonymous meetings once a week in person and is working on building a support system, having recently obtained a group member's phone  number. Some days are more challenging, but he feels that not drinking is a positive step.  Visitation with his daughter is going well. He notes that he attended a volleyball game together and had lunch, and that she is becoming more talkative and returning to her usual self with him.  He tried Wellbutrin in high school; he does not recall details of effectiveness or duration.  He currently denies any suicidality, homicidality or perceptual disturbances.    Visit Diagnosis:    ICD-10-CM   1. Bipolar 2 disorder, major depressive episode (HCC)  F31.81 lurasidone  (LATUDA ) 40 MG TABS tablet    Dextromethorphan-buPROPion ER (AUVELITY ) 45-105 MG TBCR   severe ,with anxious distress    2. Panic disorder  F41.0 escitalopram  (LEXAPRO ) 10 MG tablet    hydrOXYzine  (VISTARIL ) 25 MG capsule    Dextromethorphan-buPROPion ER (AUVELITY ) 45-105 MG TBCR    3. Alcohol use disorder, severe, in early remission (HCC)  F10.21 ramelteon  (ROZEREM ) 8 MG tablet      Past Psychiatric History: I have reviewed past psychiatric history from progress note on 09/27/2023.  Past trials of medications like Wellbutrin, Lexapro , trazodone.  Past Medical History:  Past Medical History:  Diagnosis Date   Chicken pox    COVID-19    12/2020 sob mild, 05/21/21   Depression    Diabetes mellitus without complication (HCC)    History of alcohol abuse    Hyperlipidemia    Hypertension    UC (ulcerative colitis) Jorge Wright)     Past Surgical History:  Procedure Laterality Date   COLONOSCOPY WITH PROPOFOL  N/A 07/28/2022  Procedure: COLONOSCOPY WITH PROPOFOL ;  Surgeon: Toledo, Ladell POUR, MD;  Location: [facility] ENDOSCOPY;  Service: Gastroenterology;  Laterality: N/A;  OK'D PER PM   ESOPHAGOGASTRODUODENOSCOPY (EGD) WITH PROPOFOL  N/A 07/28/2022   Procedure: ESOPHAGOGASTRODUODENOSCOPY (EGD) WITH PROPOFOL ;  Surgeon: Toledo, Ladell POUR, MD;  Location: [facility] ENDOSCOPY;  Service: Gastroenterology;  Laterality: N/A;   MANDIBLE FRACTURE SURGERY       Family Psychiatric History: I have reviewed family psychiatric history from progress note on 10/17/2023.  Family History:  Family History  Problem Relation Age of Onset   Bipolar disorder Mother    Alcohol abuse Mother    Hypertension Mother    Heart disease Mother        had heart attack 50    Diabetes Mother        type 2   Heart attack Mother 57   Alcohol abuse Father    Hypertension Father    Diabetes Father        type 1   Pancreatitis Father    Heart attack Maternal Uncle        had heart attack    Alcohol abuse Maternal Grandmother    Heart disease Maternal Grandmother        died in 34s    Hypertension Maternal Grandmother    Depression Maternal Grandmother    Stroke Paternal Grandfather     Social History: I have reviewed social history from progress note on 10/17/2023. Social History   Socioeconomic History   Marital status: Legally Separated    Spouse name: Not on file   Number of children: 1   Years of education: Not on file   Highest education level: Bachelor's degree (e.g., BA, AB, BS)  Occupational History   Not on file  Tobacco Use   Smoking status: Former   Smokeless tobacco: Former  Building Services Engineer status: Every Day   Substances: Nicotine  Substance and Sexual Activity   Alcohol use: Yes    Alcohol/week: 6.0 standard drinks of alcohol    Types: 6 Standard drinks or equivalent per week    Comment: occasional, last drink 1 month ago, has a hx of alcoholism   Drug use: Not Currently    Types: Marijuana    Comment: occasional edibles   Sexual activity: Yes  Other Topics Concern   Not on file  Social History Narrative   Works IT    separated   Kids daughter Jorge Wright    Social Drivers of Health   Financial Resource Strain: Low Risk  (12/22/2021)   Overall Financial Resource Strain (CARDIA)    Difficulty of Paying Living Expenses: Not hard at all  Food Insecurity: Not on file  Transportation Needs: Not on file  Physical Activity:  Not on file  Stress: Not on file  Social Connections: Not on file    Allergies:  Allergies  Allergen Reactions   Mounjaro  [Tirzepatide ]     Injection site reaction    Ozempic  (0.25 Or 0.5 Mg-Dose) [Semaglutide (0.25 Or 0.5mg -Dos)]     N/v/diarrhea/ab pain    Trulicity  [Dulaglutide ]     Gi Sxs   Invokana  [Canagliflozin ] Rash    Reports topical rash on trunk that occurred after taking Invokana  for about a week. Resolved after medication discontinuation. No airway involvement.    Penicillins Rash    Metabolic Disorder Labs: Lab Results  Component Value Date   HGBA1C 10.3 (H) 07/04/2024   No results found for: PROLACTIN Lab Results  Component Value Date  CHOL 154 08/15/2024   TRIG 68.0 08/15/2024   HDL 43.80 08/15/2024   CHOLHDL 4 08/15/2024   VLDL 13.6 08/15/2024   LDLCALC 96 08/15/2024   LDLCALC 111 (H) 12/31/2022   Lab Results  Component Value Date   TSH 0.66 07/04/2024   TSH 1.25 12/31/2022    Therapeutic Level Labs: No results found for: LITHIUM No results found for: VALPROATE No results found for: CBMZ  Current Medications: Current Outpatient Medications  Medication Sig Dispense Refill   Dextromethorphan-buPROPion ER (AUVELITY ) 45-105 MG TBCR Take 1 tablet by mouth in the morning. 30 tablet 1   escitalopram  (LEXAPRO ) 10 MG tablet Take 1 tablet (10 mg total) by mouth daily with breakfast. Stop the 20 mg daily 30 tablet 1   atorvastatin  (LIPITOR) 40 MG tablet Take 1 tablet (40 mg total) by mouth daily. 90 tablet 3   benztropine  (COGENTIN ) 1 MG tablet Take 1 tablet (1 mg total) by mouth at bedtime as needed for tremors. 30 tablet 1   Continuous Blood Gluc Sensor (FREESTYLE LIBRE 3 SENSOR) MISC Apply 1 each topically every 14 (fourteen) days. Place 1 sensor on the skin every 14 days. Use to check glucose continuously 2 each 11   fenofibrate  (TRICOR ) 145 MG tablet Take 1 tablet (145 mg total) by mouth daily. 90 tablet 3   hydrOXYzine  (VISTARIL ) 25 MG  capsule Take 1 capsule (25 mg total) by mouth 4 (four) times daily as needed. For severe anxiety 120 capsule 2   insulin  degludec (TRESIBA ) 100 UNIT/ML FlexTouch Pen Inject 10 Units into the skin daily. 3 mL 5   lisinopril -hydrochlorothiazide  (ZESTORETIC ) 20-12.5 MG tablet TAKE 2 TABLETS DAILY IN THE MORNING 180 tablet 3   lurasidone  (LATUDA ) 40 MG TABS tablet Take 1 tablet (40 mg total) by mouth daily with breakfast. 30 tablet 1   metFORMIN  (GLUCOPHAGE -XR) 500 MG 24 hr tablet Take 2 tablets (1,000 mg total) by mouth daily with breakfast. 180 tablet 3   ramelteon  (ROZEREM ) 8 MG tablet Take 1 tablet (8 mg total) by mouth at bedtime. 30 tablet 4   No current facility-administered medications for this visit.     Musculoskeletal: Strength & Muscle Tone: within normal limits Gait & Station: normal Patient leans: N/A  Psychiatric Specialty Exam: Review of Systems  Psychiatric/Behavioral:  Positive for dysphoric mood and sleep disturbance. The patient is nervous/anxious.     Blood pressure 127/78, pulse 72, temperature 97.8 F (36.6 C), height 6' 1 (1.854 m), weight 209 lb (94.8 kg), SpO2 100%.Body mass index is 27.57 kg/m.  General Appearance: Casual  Eye Contact:  Fair  Speech:  Clear and Coherent  Volume:  Normal  Mood:  Anxious and Depressed  Affect:  Congruent  Thought Process:  Goal Directed and Descriptions of Associations: Intact  Orientation:  Full (Time, Place, and Person)  Thought Content: Logical   Suicidal Thoughts:  No has had passive thoughts of not wanting to be here within the past month or so although denies any active thoughts.  Homicidal Thoughts:  No  Memory:  Immediate;   Fair Recent;   Fair Remote;   Fair  Judgement:  Fair  Insight:  Fair  Psychomotor Activity:  Normal  Concentration:  Concentration: Fair and Attention Span: Fair  Recall:  [employer] of Knowledge: Fair  Language: Fair  Akathisia:  No  Handed:  Right  AIMS (if indicated): Denies any  muscle spasms rigidity tremors  Assets:  Communication Skills Desire for Improvement Housing Social Support Transportation  ADL's:  Intact  Cognition: WNL  Sleep:  Fair   Screenings: Midwife Visit from [facility]/23/2024 in [facility] Office Visit from 11/14/2023 in [facility]  AIMS Total Score 0 0   GAD-7    Flowsheet Row Office Visit from 09/27/2024 in [facility] Office Visit from 09/05/2024 in [facility] Office Visit from 07/04/2024 in [facility] Visit from 11/14/2023 in [facility] Office Visit from 10/17/2023 in [facility]  Total GAD-7 Score 13 20 19 17 21    PHQ2-9    Flowsheet Row Office Visit from 09/27/2024 in [facility] Office Visit from 09/05/2024 in [facility] Office Visit from 07/04/2024 in [facility] Visit from 11/14/2023 in [facility] Office Visit from 10/17/2023 in [facility]  PHQ-2 Total Score 4 6 4 2 4   PHQ-9 Total Score 17 21 18 9 21    Flowsheet Row Office Visit from 09/27/2024 in [facility] Office Visit from 09/05/2024 in [facility] Video Visit from 07/17/2024 in [facility]  C-SSRS RISK CATEGORY Low Risk Low Risk No Risk     Assessment and Plan: Jorge Wright is a 38 year old Caucasian male who has a history of bipolar disorder, panic attacks, alcoholism was evaluated in office today.  Discussed assessment and plan as noted  below.  1. Bipolar 2 disorder, major depressive episode (HCC) with anxious distress-some improvement Although mood symptoms have improved continues to struggle with depression symptoms.  Agreeable to addition of Auvelity  and reducing Lexapro . Continue Latuda  40 mg daily Reduce Lexapro  to 10 mg daily Start Auvelity  45-105 mg daily in the morning Continue Rozerem  8 mg at bedtime for sleep Continue Benztropine  1 mg at bedtime as needed for side effects of Latuda .  2. Panic disorder-improving Currently reports anxiety symptoms is improved Continue CBT with Ms. Jorge Wright Hummer Reduce Lexapro  to 10 mg daily Continue Hydroxyzine  25 mg 4 times a day as needed  3. Alcohol use disorder, severe, in early remission (HCC) Reports last use of alcohol 1-1/2 weeks ago.  Currently attending AA meetings and continues to be following up with Ms. Jorge Wright Hummer for therapy. Continue AA meetings. Continue CBT with Ms. Jorge Wright Hummer  Follow-up Follow-up in clinic in 4 weeks or sooner if needed.    Collaboration of Care: Collaboration of Care: Referral or follow-up with counselor/therapist AEB encouraged to continue psychotherapy sessions has upcoming appointment.  I have coordinated care with Ms. Jorge Wright Hummer.  Patient/Guardian was advised Release of Information must be obtained prior to any record release in order to collaborate their care with an outside provider. Patient/Guardian was advised if they have not already done so to contact the registration department to sign all necessary forms in order for us  to release information regarding their care.   Consent: Patient/Guardian gives verbal consent for treatment and assignment of benefits for services provided during this visit. Patient/Guardian expressed understanding and agreed to proceed.  This note was generated in part or whole with voice recognition software. Voice recognition is usually quite accurate but there are transcription errors that can and  very often do occur. I apologize for any typographical errors  that were not detected and corrected.     Danzell Birky, MD 09/28/2024, 8:57 AM

## 2024-09-28 ENCOUNTER — Ambulatory Visit (HOSPITAL_COMMUNITY)

## 2024-10-01 ENCOUNTER — Ambulatory Visit (HOSPITAL_COMMUNITY)

## 2024-10-02 ENCOUNTER — Ambulatory Visit (HOSPITAL_COMMUNITY)

## 2024-10-03 ENCOUNTER — Encounter: Payer: 59 | Attending: Psychology | Admitting: Psychology

## 2024-10-03 DIAGNOSIS — R41840 Attention and concentration deficit: Secondary | ICD-10-CM | POA: Diagnosis present

## 2024-10-03 DIAGNOSIS — F3181 Bipolar II disorder: Secondary | ICD-10-CM | POA: Insufficient documentation

## 2024-10-03 DIAGNOSIS — R748 Abnormal levels of other serum enzymes: Secondary | ICD-10-CM | POA: Insufficient documentation

## 2024-10-03 DIAGNOSIS — F1021 Alcohol dependence, in remission: Secondary | ICD-10-CM | POA: Diagnosis present

## 2024-10-03 DIAGNOSIS — F410 Panic disorder [episodic paroxysmal anxiety] without agoraphobia: Secondary | ICD-10-CM | POA: Insufficient documentation

## 2024-10-03 DIAGNOSIS — Z87820 Personal history of traumatic brain injury: Secondary | ICD-10-CM | POA: Insufficient documentation
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
## 2024-10-04 ENCOUNTER — Ambulatory Visit (HOSPITAL_COMMUNITY)

## 2024-10-05 ENCOUNTER — Ambulatory Visit (HOSPITAL_COMMUNITY)

## 2024-10-08 ENCOUNTER — Ambulatory Visit (HOSPITAL_COMMUNITY)

## 2024-10-09 ENCOUNTER — Ambulatory Visit (HOSPITAL_COMMUNITY)

## 2024-10-09 ENCOUNTER — Encounter

## 2024-10-09 DIAGNOSIS — R41840 Attention and concentration deficit: Secondary | ICD-10-CM

## 2024-10-09 DIAGNOSIS — Z87820 Personal history of traumatic brain injury: Secondary | ICD-10-CM

## 2024-10-09 DIAGNOSIS — F410 Panic disorder [episodic paroxysmal anxiety] without agoraphobia: Secondary | ICD-10-CM

## 2024-10-09 DIAGNOSIS — F3181 Bipolar II disorder: Secondary | ICD-10-CM | POA: Diagnosis not present

## 2024-10-09 DIAGNOSIS — R748 Abnormal levels of other serum enzymes: Secondary | ICD-10-CM

## 2024-10-09 DIAGNOSIS — F1021 Alcohol dependence, in remission: Secondary | ICD-10-CM

## 2024-10-09 NOTE — Progress Notes (Signed)
 Behavioral Observations:  The patient appeared well-groomed and appropriately dressed. His manners were polite and appropriate to the situation. The patient's attitude towards testing was positive and he demonstrated a good effort.   Neuropsychology Note  Jorge Wright completed 120 minutes of neuropsychological testing with technician, Josue Ned, BA, under the supervision of Norleen Asa, PsyD., Clinical Neuropsychologist. The patient did not appear overtly distressed by the testing session, per behavioral observation or via self-report to the technician. Rest breaks were offered.   Clinical Decision Making: In considering the patient's current level of functioning, level of presumed impairment, nature of symptoms, emotional and behavioral responses during clinical interview, level of literacy, and observed level of motivation/effort, a battery of tests was selected by Dr. Asa during initial consultation on 10/03/2024. This was communicated to the technician. Communication between the neuropsychologist and technician was ongoing throughout the testing session and changes were made as deemed necessary based on patient performance on testing, technician observations and additional pertinent factors such as those listed above.  Tests Administered: Controlled Oral Word Association Test (COWAT; FAS & Animals)  Wechsler Adult Intelligence Scale, 4th Edition (WAIS-IV) Wechsler Memory Scale, 4th Edition (WMS-IV); Adult Battery  Results:  COWAT: FAS total= 30  Z= -1.15 Animals total= 14 Z= -1.40   WAIS-IV:   Composite Score Summary  Scale Sum of Scaled Scores Composite Score Percentile Rank 95% Conf. Interval Qualitative Description  Verbal Comprehension 34 VCI 107 68 101-112 Average  Perceptual Reasoning 32 PRI 104 61 98-110 Average  Working Memory 19 WMI 97 42 90-104 Average  Processing Speed 13 PSI 81 10 75-91 Low Average  Full Scale 98 FSIQ 98 45 94-102 Average   General Ability 66 GAI 105 63 100-110 Average  Confidence Intervals are based on the Overall Average SEMs.   Verbal Comprehension Subtests Summary  Subtest Raw Score Scaled Score Percentile Rank Reference Group Scaled Score SEM  Similarities 26 10 50 10 1.04  Vocabulary 38 10 50 11 0.73  Information 20 14 91 14 0.85  The scaled scores in the Reference Group Scaled Score column are based on the performance of examinees aged 20:0-34:11 (i.e., the reference group). See Chapter 6 of the WAIS-IV Technical and Interpretive Manual for more information.  Perceptual Reasoning Subtests Summary  Subtest Raw Score Scaled Score Percentile Rank Reference Group Scaled Score SEM  Block Design 55 13 84 12 0.99  Matrix Reasoning 20 11 63 11 0.95  Visual Puzzles 11 8 25 7  1.04   Working Librarian, academic Raw Score Scaled Score Percentile Rank Reference Group Scaled Score SEM  Digit Span 25 8 25 8  0.73  Arithmetic 16 11 63 11 0.99   Processing Speed Subtests Summary  Subtest Raw Score Scaled Score Percentile Rank Reference Group Scaled Score SEM  Symbol Search 24 7 16 7  1.56  Coding 46 6 9 5  1.20    WMS-IV:   Index Score Summary  Index Sum of Scaled Scores Index Score Percentile Rank 95% Confidence Interval Qualitative Descriptor  Auditory Memory (AMI) 42 102 55 96-108 Average  Visual Memory (VMI) 33 89 23 84-95 Low Average  Visual Working Memory (VWMI) 15 85 16 79-93 Low Average  Immediate Memory (IMI) 41 102 55 96-108 Average  Delayed Memory (DMI) 34 90 25 84-97 Average   Primary Subtest Scaled Score Summary  Subtest Domain Raw Score Scaled Score Percentile Rank  Logical Memory I AM 28 11 63  Logical Memory II AM 23 10 50  Verbal Paired Associates I AM 40 12 75  Verbal Paired Associates II AM 10 9 37  Designs I VM 53 6 9  Designs II VM 51 9 37  Visual Reproduction I VM 41 12 75  Visual Reproduction II VM 13 6 9   Spatial Addition VWM 9 7 16   Symbol Span VWM 21 8 25     Auditory Memory Process Score Summary  Process Score Raw Score Scaled Score Percentile Rank Cumulative Percentage (Base Rate)  LM II Recognition 26 - - 51-75%  VPA II Recognition 39 - - 26-50%   Visual Memory Process Score Summary  Process Score Raw Score Scaled Score Percentile Rank Cumulative Percentage (Base Rate)  DE I Content 28 6 9  -  DE I Spatial 13 6 9  -  DE II Content 33 9 37 -  DE II Spatial 10 8 25  -  DE II Recognition 14 - - 26-50%  VR II Recognition 4 - - 10-16%   ABILITY-MEMORY ANALYSIS  Ability Score:  VCI: 107 Date of Testing:  WAIS-IV; WMS-IV 2024/10/09  Predicted Difference Method   Index Predicted WMS-IV Index Score Actual WMS-IV Index Score Difference Critical Value  Significant Difference Y/N Base Rate  Auditory Memory 104 102 2 8.91 N   Visual Memory 103 89 14 8.02 Y 15%  Visual Working Memory 104 85 19 11.90 Y 5-10%  Immediate Memory 104 102 2 9.48 N   Delayed Memory 104 90 14 10.18 Y 15%  Statistical significance (critical value) at the .01 level.     Feedback to Patient: Jorge Wright will return on 02/05/2024 for an interactive feedback session with Dr. Corina at which time his test performances, clinical impressions and treatment recommendations will be reviewed in detail. The patient understands he can contact our office should he require our assistance before this time.  120 minutes spent face-to-face with patient administering standardized tests, 30 minutes spent scoring Radiographer, therapeutic). [CPT [id number], [id number]]  Full report to follow.

## 2024-10-10 ENCOUNTER — Encounter: Payer: Self-pay | Admitting: Nurse Practitioner

## 2024-10-10 ENCOUNTER — Ambulatory Visit (HOSPITAL_COMMUNITY)

## 2024-10-10 ENCOUNTER — Telehealth: Payer: Self-pay

## 2024-10-10 ENCOUNTER — Ambulatory Visit: Admitting: Nurse Practitioner

## 2024-10-10 VITALS — BP 126/88 | HR 94 | Temp 98.4°F | Ht 73.0 in | Wt 210.6 lb

## 2024-10-10 DIAGNOSIS — Z23 Encounter for immunization: Secondary | ICD-10-CM | POA: Diagnosis not present

## 2024-10-10 DIAGNOSIS — K5100 Ulcerative (chronic) pancolitis without complications: Secondary | ICD-10-CM | POA: Diagnosis not present

## 2024-10-10 DIAGNOSIS — Z794 Long term (current) use of insulin: Secondary | ICD-10-CM | POA: Diagnosis not present

## 2024-10-10 DIAGNOSIS — E782 Mixed hyperlipidemia: Secondary | ICD-10-CM

## 2024-10-10 DIAGNOSIS — I152 Hypertension secondary to endocrine disorders: Secondary | ICD-10-CM | POA: Diagnosis not present

## 2024-10-10 DIAGNOSIS — E1159 Type 2 diabetes mellitus with other circulatory complications: Secondary | ICD-10-CM

## 2024-10-10 DIAGNOSIS — E1165 Type 2 diabetes mellitus with hyperglycemia: Secondary | ICD-10-CM

## 2024-10-10 LAB — CBC WITH DIFFERENTIAL/PLATELET
Basophils Absolute: 0 K/uL (ref 0.0–0.1)
Basophils Relative: 0.4 % (ref 0.0–3.0)
Eosinophils Absolute: 0 K/uL (ref 0.0–0.7)
Eosinophils Relative: 0.1 % (ref 0.0–5.0)
HCT: 38.2 % — ABNORMAL LOW (ref 39.0–52.0)
Hemoglobin: 12.6 g/dL — ABNORMAL LOW (ref 13.0–17.0)
Lymphocytes Relative: 16.1 % (ref 12.0–46.0)
Lymphs Abs: 2.1 K/uL (ref 0.7–4.0)
MCHC: 32.9 g/dL (ref 30.0–36.0)
MCV: 84 fl (ref 78.0–100.0)
Monocytes Absolute: 0.9 K/uL (ref 0.1–1.0)
Monocytes Relative: 7.2 % (ref 3.0–12.0)
Neutro Abs: 9.9 K/uL — ABNORMAL HIGH (ref 1.4–7.7)
Neutrophils Relative %: 76.2 % (ref 43.0–77.0)
Platelets: 302 K/uL (ref 150.0–400.0)
RBC: 4.54 Mil/uL (ref 4.22–5.81)
RDW: 13.4 % (ref 11.5–15.5)
WBC: 12.9 K/uL — ABNORMAL HIGH (ref 4.0–10.5)

## 2024-10-10 LAB — BASIC METABOLIC PANEL WITH GFR
BUN: 17 mg/dL (ref 6–23)
CO2: 26 meq/L (ref 19–32)
Calcium: 9 mg/dL (ref 8.4–10.5)
Chloride: 93 meq/L — ABNORMAL LOW (ref 96–112)
Creatinine, Ser: 1.03 mg/dL (ref 0.40–1.50)
GFR: 91.53 mL/min (ref >60.00)
Glucose, Bld: 285 mg/dL — ABNORMAL HIGH (ref 70–99)
Potassium: 4.2 meq/L (ref 3.5–5.1)
Sodium: 130 meq/L — ABNORMAL LOW (ref 135–145)

## 2024-10-10 LAB — HEMOGLOBIN A1C: Hgb A1c MFr Bld: 11.1 % — ABNORMAL HIGH (ref 4.6–6.5)

## 2024-10-10 MED ORDER — DEXCOM G7 SENSOR MISC: 11 refills | Status: AC

## 2024-10-10 MED ORDER — METFORMIN HCL ER 500 MG PO TB24: 1000.0000 mg | ORAL_TABLET | Freq: Two times a day (BID) | ORAL | 3 refills | Status: AC

## 2024-10-10 NOTE — Assessment & Plan Note (Signed)
 Hypertension is well-controlled with satisfactory blood pressure readings. Currently managed with Lisinopril -hydrochlorothiazide . He experiences dizziness when standing quickly but has no other symptoms. Advised slow transitions. Continue current medication regimen. Check BMP.

## 2024-10-10 NOTE — Assessment & Plan Note (Signed)
 Hyperlipidemia is adequately controlled with Lipitor and fenofibrate . Recent cholesterol levels are satisfactory. Continue current medication regimen.

## 2024-10-10 NOTE — Progress Notes (Signed)
 Jorge Glance, NP-C Phone: [phone]  Jorge Wright is a 38 y.o. male who presents today for follow up.   Discussed the use of AI scribe software for clinical note transcription with the patient, who gave verbal consent to proceed.  History of Present Illness   Jorge Wright is a 38 year old male with diabetes and ulcerative colitis who presents for a three-month follow-up.  He initially used insulin  for the first two pens but then discontinued it. He is interested in using a continuous glucose monitor, such as a Dexcom, to better manage his blood sugar levels. Three months ago, his A1c was 10.3. He has been inconsistent with his insulin  use, specifically Tresiba , which he was taking at 10 units, usually at bedtime. He has been taking metformin , but only once a day. When he was consistent with his medication, his blood sugar was around 120 mg/dL. No excessive thirst, frequent urination, chest pain, shortness of breath, or swelling, but he notes dizziness when standing up too quickly.  He has issues with ulcerative colitis, noting significant rectal bleeding, describing the toilet bowl as 'red' with bright red blood. He is not currently on any medication for ulcerative colitis due to cost and availability issues. He is scheduled to see his gastroenterologist on the 27th of this month. He has not been able to tolerate GLP-1 medications due to site inflammation and gastrointestinal issues, which he is unsure if they were related to the medication or his ulcerative colitis.  He has a history of low blood counts likely due to his UC. He has a blood pressure cuff at home but does not check it regularly. He feels generally okay despite these issues.      Social History   Tobacco Use  Smoking Status Former  Smokeless Tobacco Former    Current Outpatient Medications on File Prior to Visit  Medication Sig Dispense Refill   atorvastatin  (LIPITOR) 40 MG tablet Take 1 tablet (40 mg total) by  mouth daily. 90 tablet 3   benztropine  (COGENTIN ) 1 MG tablet Take 1 tablet (1 mg total) by mouth at bedtime as needed for tremors. 30 tablet 1   Dextromethorphan-buPROPion ER (AUVELITY) 45-105 MG TBCR Take 1 tablet by mouth in the morning. 30 tablet 1   escitalopram  (LEXAPRO ) 10 MG tablet Take 1 tablet (10 mg total) by mouth daily with breakfast. Stop the 20 mg daily 30 tablet 1   fenofibrate  (TRICOR ) 145 MG tablet Take 1 tablet (145 mg total) by mouth daily. 90 tablet 3   hydrOXYzine  (VISTARIL ) 25 MG capsule Take 1 capsule (25 mg total) by mouth 4 (four) times daily as needed. For severe anxiety 120 capsule 2   insulin  degludec (TRESIBA ) 100 UNIT/ML FlexTouch Pen Inject 10 Units into the skin daily. 3 mL 5   lisinopril -hydrochlorothiazide  (ZESTORETIC ) 20-12.5 MG tablet TAKE 2 TABLETS DAILY IN THE MORNING 180 tablet 3   lurasidone  (LATUDA ) 40 MG TABS tablet Take 1 tablet (40 mg total) by mouth daily with breakfast. 30 tablet 1   ramelteon  (ROZEREM ) 8 MG tablet Take 1 tablet (8 mg total) by mouth at bedtime. 30 tablet 4   No current facility-administered medications on file prior to visit.     ROS see history of present illness  Objective  Physical Exam Vitals:   10/10/24 0813  BP: 126/88  Pulse: 94  Temp: 98.4 F (36.9 C)  SpO2: 98%    BP Readings from Last 3 Encounters:  10/10/24 126/88  07/04/24 118/82  07/01/23 118/64   Wt Readings from Last 3 Encounters:  10/10/24 210 lb 9.6 oz (95.5 kg)  07/04/24 215 lb 12.8 oz (97.9 kg)  07/01/23 214 lb 6.4 oz (97.3 kg)    Physical Exam Constitutional:      General: He is not in acute distress.    Appearance: Normal appearance.  HENT:     Head: Normocephalic.  Cardiovascular:     Rate and Rhythm: Normal rate and regular rhythm.     Heart sounds: Normal heart sounds.  Pulmonary:     Effort: Pulmonary effort is normal.     Breath sounds: Normal breath sounds.  Skin:    General: Skin is warm and dry.  Neurological:      General: No focal deficit present.     Mental Status: He is alert.  Psychiatric:        Mood and Affect: Mood normal.        Behavior: Behavior normal.      Assessment/Plan: Please see individual problem list.  Type 2 diabetes mellitus with hyperglycemia, with long-term current use of insulin  (HCC) Assessment & Plan: Type 2 diabetes with a previously elevated A1c of 10.3%. He has been inconsistent with insulin  use and not checking blood glucose, though some improvement is noted with consistent insulin . No hyperglycemia symptoms are present. Continue Tresiba  10 units at bedtime, titrating every 3-4 days if glucose is high, with a maximum of 20 units without consultation. Increase metformin  to 1000 mg twice daily. Send CGM sensors prescription to [facility] and check A1c. Coordinate with the pharmacist for insulin  titration and monitoring. Follow-up in one month.  Orders: -     Hemoglobin A1c -     Dexcom G7 Sensor; Use as directed to monitor blood glucose. Change sensor every 10 days.  Dispense: 3 each; Refill: 11 -     metFORMIN  HCl ER; Take 2 tablets (1,000 mg total) by mouth 2 (two) times daily with a meal.  Dispense: 360 tablet; Refill: 3 -     AMB Referral VBCI Care Management  Ulcerative pancolitis (HCC) Assessment & Plan: Ulcerative colitis with hematochezia and significant blood loss. Concern for worsening anemia. He is not on medication due to cost, and a gastroenterologist follow-up is scheduled. Check CBC today. Follow-up with the gastroenterologist as scheduled this month. Strict precautions given to patient.   Orders: -     CBC with Differential/Platelet  Hypertension associated with diabetes (HCC) Assessment & Plan: Hypertension is well-controlled with satisfactory blood pressure readings. Currently managed with Lisinopril -hydrochlorothiazide . He experiences dizziness when standing quickly but has no other symptoms. Advised slow transitions. Continue current medication  regimen. Check BMP.  Orders: -     Basic metabolic panel with GFR  Mixed hyperlipidemia Assessment & Plan: Hyperlipidemia is adequately controlled with Lipitor and fenofibrate . Recent cholesterol levels are satisfactory. Continue current medication regimen.    Need for influenza vaccination -     Flu vaccine trivalent PF, 6mos and older(Flulaval,Afluria,Fluarix,Fluzone)      Return in about 4 weeks (around 11/07/2024) for Follow up.   Jorge Glance, NP-C [facility]

## 2024-10-10 NOTE — Assessment & Plan Note (Signed)
 Ulcerative colitis with hematochezia and significant blood loss. Concern for worsening anemia. He is not on medication due to cost, and a gastroenterologist follow-up is scheduled. Check CBC today. Follow-up with the gastroenterologist as scheduled this month. Strict precautions given to patient.

## 2024-10-10 NOTE — Telephone Encounter (Signed)
 Patient saw Leron Glance, FNP-C, today and Leron would like to see him in four weeks for a follow-up appointment.  I scheduled patient in her next available slot on 11/29/2024.  I let patient know that I will send a message to Leron so she will know how far out he has been scheduled.

## 2024-10-10 NOTE — Assessment & Plan Note (Signed)
 Type 2 diabetes with a previously elevated A1c of 10.3%. He has been inconsistent with insulin  use and not checking blood glucose, though some improvement is noted with consistent insulin . No hyperglycemia symptoms are present. Continue Tresiba  10 units at bedtime, titrating every 3-4 days if glucose is high, with a maximum of 20 units without consultation. Increase metformin  to 1000 mg twice daily. Send CGM sensors prescription to [facility] and check A1c. Coordinate with the pharmacist for insulin  titration and monitoring. Follow-up in one month.

## 2024-10-11 ENCOUNTER — Ambulatory Visit: Admitting: Psychiatry

## 2024-10-12 ENCOUNTER — Telehealth: Payer: Self-pay

## 2024-10-12 ENCOUNTER — Ambulatory Visit (HOSPITAL_COMMUNITY)

## 2024-10-12 NOTE — Progress Notes (Unsigned)
 Complex Care Management Note Care Guide Note  10/12/2024 Name: Jorge Wright MRN: [id number] DOB: 12-16-1986   Complex Care Management Outreach Attempts: An unsuccessful telephone outreach was attempted today to offer the patient information about available complex care management services.  Follow Up Plan:  Additional outreach attempts will be made to offer the patient complex care management information and services.   Encounter Outcome:  No Answer  Dreama [facility]  [facility], [facility] VBCI Assistant Direct Dial: [phone]  Fax: [phone]

## 2024-10-15 ENCOUNTER — Encounter

## 2024-10-15 ENCOUNTER — Ambulatory Visit (HOSPITAL_COMMUNITY)

## 2024-10-15 DIAGNOSIS — F3181 Bipolar II disorder: Secondary | ICD-10-CM | POA: Diagnosis not present

## 2024-10-15 DIAGNOSIS — Z87820 Personal history of traumatic brain injury: Secondary | ICD-10-CM

## 2024-10-15 DIAGNOSIS — F1021 Alcohol dependence, in remission: Secondary | ICD-10-CM

## 2024-10-15 DIAGNOSIS — F410 Panic disorder [episodic paroxysmal anxiety] without agoraphobia: Secondary | ICD-10-CM | POA: Diagnosis not present

## 2024-10-15 DIAGNOSIS — R41840 Attention and concentration deficit: Secondary | ICD-10-CM

## 2024-10-15 NOTE — Progress Notes (Unsigned)
 Complex Care Management Note Care Guide Note  10/15/2024 Name: Jorge Wright MRN: [id number] DOB: 14-Nov-1986   Complex Care Management Outreach Attempts: A second unsuccessful outreach was attempted today to offer the patient with information about available complex care management services.  Follow Up Plan:  Additional outreach attempts will be made to offer the patient complex care management information and services.   Encounter Outcome:  No Answer  Dreama [facility]  [facility], [facility] VBCI Assistant Direct Dial: [phone]  Fax: [phone]

## 2024-10-15 NOTE — Progress Notes (Signed)
   Behavioral Observations:  The patient appeared well-groomed and appropriately dressed. His manners were polite and appropriate to the situation. The patient's attitude towards testing was positive and he demonstrated a good effort.   Neuropsychology Note  Jorge Wright completed 80 minutes of neuropsychological testing with technician, Josue Ned, BA, under the supervision of Norleen Asa, PsyD., Clinical Neuropsychologist. The patient did not appear overtly distressed by the testing session, per behavioral observation or via self-report to the technician. Rest breaks were offered.   Clinical Decision Making: In considering the patient's current level of functioning, level of presumed impairment, nature of symptoms, emotional and behavioral responses during clinical interview, level of literacy, and observed level of motivation/effort, a battery of tests was selected by Dr. Asa during initial consultation on 10/03/2024. This was communicated to the technician. Communication between the neuropsychologist and technician was ongoing throughout the testing session and changes were made as deemed necessary based on patient performance on testing, technician observations and additional pertinent factors such as those listed above.  Tests Administered: Comprehensive Attention Battery (CAB) Continuous Performance Test (CPT)  Results: Will be included in final report   Feedback to Patient: Jorge Wright will return on 02/04/2025 for an interactive feedback session with Dr. Asa at which time his test performances, clinical impressions and treatment recommendations will be reviewed in detail. The patient understands he can contact our office should he require our assistance before this time.  80 minutes spent face-to-face with patient administering standardized tests, 30 minutes spent scoring Radiographer, therapeutic). [CPT [id number], [id number]]  Full report to follow.

## 2024-10-16 ENCOUNTER — Ambulatory Visit (HOSPITAL_COMMUNITY)

## 2024-10-17 ENCOUNTER — Ambulatory Visit (HOSPITAL_COMMUNITY)

## 2024-10-17 NOTE — Progress Notes (Signed)
 Complex Care Management Note  Care Guide Note 10/17/2024 Name: EMON LANCE MRN: [id number] DOB: 1986/12/07  Norman DELENA Pickle is a 38 y.o. year old male who sees Gretel App, NP for primary care. I reached out to Norman DELENA Pickle by phone today to offer complex care management services.  Mr. Buller was given information about Complex Care Management services today including:   The Complex Care Management services include support from the care team which includes your Nurse Care Manager, Clinical Social Worker, or Pharmacist.  The Complex Care Management team is here to help remove barriers to the health concerns and goals most important to you. Complex Care Management services are voluntary, and the patient may decline or stop services at any time by request to their care team member.   Complex Care Management Consent Status: Patient agreed to services and verbal consent obtained.   Follow up plan:  Telephone appointment with complex care management team member scheduled for:  10/23/24 at 3:00 p.m.   Encounter Outcome:  Patient Scheduled  Dreama [facility]  [facility], [facility] VBCI Assistant Direct Dial: [phone]  Fax: [phone]

## 2024-10-18 ENCOUNTER — Ambulatory Visit (HOSPITAL_COMMUNITY)

## 2024-10-19 ENCOUNTER — Ambulatory Visit (HOSPITAL_COMMUNITY)

## 2024-10-22 ENCOUNTER — Ambulatory Visit (HOSPITAL_COMMUNITY)

## 2024-10-23 ENCOUNTER — Ambulatory Visit (HOSPITAL_COMMUNITY)

## 2024-10-23 ENCOUNTER — Other Ambulatory Visit: Admitting: Pharmacist

## 2024-10-23 DIAGNOSIS — E1165 Type 2 diabetes mellitus with hyperglycemia: Secondary | ICD-10-CM

## 2024-10-23 DIAGNOSIS — E782 Mixed hyperlipidemia: Secondary | ICD-10-CM

## 2024-10-23 DIAGNOSIS — I152 Hypertension secondary to endocrine disorders: Secondary | ICD-10-CM

## 2024-10-23 NOTE — Progress Notes (Signed)
 10/23/2024 Name: Jorge Wright MRN: [id number] DOB: 06-Dec-1986  Subjective  Chief Complaint  Patient presents with   Diabetes    Reason for visit: ?  Jorge Wright is a 38 y.o. male with a history of diabetes (type 2), who presents today for an initial diabetes pharmacotherapy visit. They were referred by their PCP for management of diabetes.  Pertinent PMH also includes HTN, chronic gastritis/colitis, hepatic steatosis, HLD, Anx/depression.  Known DM Complications: none   Care Team: Primary Care Provider: Gretel App, NP   Date of Last Diabetes Related Visit: with PCP on 10/10/24  Recent Summary of Change: 10/15: Inconsistent with insulin  and metformin  use. A1c increased 11.1%. Increase MTF to 1000 mg BID  Medication Access/Adherence: Prescription drug coverage: Payor: [employer] / Plan: [employer] NAP / Product Type: *No Product type* / .  - Reports that all medications are affordable. Dexcom was well-covered. GLP1 prior were well-covered.    Since Last visit / History of Present Illness: ?  Patient reports implementing plan from last visit. Denies adverse effects with titration of metformin . Notes self-increasing Tresiba  by 2 additional units d/t hyperglycemia.   Previously referred to Endo in ~2023, notes [place] was too far for him.   Has tried GLP1s in the past though with questionable GI side effects. Retrospectively, patient feels it is unclear if related to medicine vs later-diagnosed UC.    Reported DM Regimen: ?  Metformin  XR 1000 mg twice daily Tresiba  12 units once daily (night) - self-increased from 10 units   DM medications tried in the past:?  Ozempic  (titrated to 1.0 mg/wk, d/c 2023) Mounjaro  (titrated to 7.5 mg/wk, d/c Dec 2022) Trulicity  (titrated to 1.5 mg/wk) Invokana  (topical rash on trunk that occurred after taking Invokana  for about a week. Resolved after medication discontinuation. No airway involvement)  SMBG: Dexcom G7  Connected to Clinic  portal today.  First sensor placed today. BG this afternoon 1-3 pm range = 169-HIGH   Hypo/Hyperglycemia: ?  Symptoms of hypoglycemia since last visit:? no  Symptoms of hyperglycemia since last visit:? no  Reported Diet: Eat what I want. Mostly meat and veggies. Feels greatest barrier may be pastas/rice. Minimal sweets.   Exercise: No formal exercise. Moves a lot, constantly up and down stairs.   DM Prevention:  Statin: Taking; high intensity.?  History of chronic kidney disease? no History of albuminuria? no, last UACR on 2022 = 5 mg/g ACE/ARB - Taking lisinopril  40mg ; Urine MA/CR Ratio - historically <30 mg/g - DUE.  Last eye exam: 12/06/2023; No retinopathy present Last foot exam: No foot exam found Tobacco Use: Former cigarette (quite ~age 11). Currently vapes  Immunizations:? Flu: Up to date (Last: 10/10/2024); Pneumococcal: PPSV23 (2014, 2015) PCV13 (2023); Tdap; Due (Last: 2013)  Cardiovascular Risk Reduction History of clinical ASCVD? no Fam Hx: Premature CAD in mother. Premature death (age 18s) several male relatives on maternal side from MI.  History of heart failure? no History of hyperlipidemia? yes Current BMI: 27.7 kg/m2 (Ht 6'1, Wt 95.5 kg) Taking statin? yes; high intensity (atorvastatin  40 mg daily) Taking aspirin? not indicated; Not taking   Taking SGLT-2i? no Taking GLP- 1 RA? no      _______________________________________________  Objective    Review of Systems:? Limited in the setting of virtual visit Constitutional:? No fever, chills or unintentional weight loss  Endocrine:? No polyuria, polyphagia or blurred vision    Physical Examination:  Vitals:  Wt Readings from Last 3 Encounters:  10/10/24 210 lb 9.6 oz (  95.5 kg)  09/27/24 209 lb (94.8 kg)  09/05/24 212 lb 3.2 oz (96.3 kg)   BP Readings from Last 3 Encounters:  10/10/24 126/88  09/27/24 127/78  09/05/24 (!) 145/98   Pulse Readings from Last 3 Encounters:  10/10/24 94  09/27/24 72   09/05/24 (!) 102     Labs:?  Lab Results  Component Value Date   HGBA1C 11.1 (H) 10/10/2024   HGBA1C 10.3 (H) 07/04/2024   HGBA1C 8.5 (A) 07/01/2023   HGBA1C 8.5 07/01/2023   HGBA1C 8.5 (A) 07/01/2023   HGBA1C 8.5 (A) 07/01/2023   GLUCOSE 285 (H) 10/10/2024   MICRALBCREAT 5 07/31/2021   MICRALBCREAT 3 07/08/2020   CREATININE 1.03 10/10/2024   CREATININE 1.09 07/04/2024   CREATININE 1.13 12/31/2022   GFR 91.53 10/10/2024   GFR 85.68 07/04/2024   GFR 82.93 12/31/2022   Lab Results  Component Value Date   CHOL 154 08/15/2024   LDLCALC 96 08/15/2024   LDLCALC 111 (H) 12/31/2022   LDLCALC 79 07/03/2019   LDLDIRECT 71.0 02/01/2022   LDLDIRECT 93.0 07/31/2021   HDL 43.80 08/15/2024   TRIG 68.0 08/15/2024   TRIG 90.0 12/31/2022   TRIG 337.0 (H) 02/01/2022   ALT 16 08/15/2024   ALT 37 07/04/2024   AST 11 08/15/2024   AST 16 07/04/2024      Chemistry      Component Value Date/Time   NA 130 (L) 10/10/2024 0835   K 4.2 10/10/2024 0835   CL 93 (L) 10/10/2024 0835   CO2 26 10/10/2024 0835   BUN 17 10/10/2024 0835   CREATININE 1.03 10/10/2024 0835      Component Value Date/Time   CALCIUM  9.0 10/10/2024 0835   ALKPHOS 59 08/15/2024 0814   AST 11 08/15/2024 0814   ALT 16 08/15/2024 0814   BILITOT 0.4 08/15/2024 0814   BILITOT 0.2 07/11/2017 0000     The ASCVD Risk score (Arnett DK, et al., 2019) failed to calculate for the following reasons:   The 2019 ASCVD risk score is only valid for ages 46 to 39  Assessment and Plan:   1. Diabetes, type 2: uncontrolled per last A1c of 11.1% (10/15), increased from previous, 10.3%. Goal <7% without hypoglycemia. Recently started on basal insulin  at 10 units. Self-increased to 12 units with little change in hyperglycemia. Reports 1 missed dose this week (yesterday). Placed 1st Dexcom today.  Current Regimen: metfomrin XR 1000 mg BID, Tresiba  12 u/d Diet: Discussed general diabetes goals including macronutrient differences. Will  continue to set gradual goals over time. Exercise: No formal exercise. Goal to add ~30 minute after-dinner walk at least 1 day per week   HCM: Due for UACR Increase Tresiba  from 12 to 15 units once daily After 3 days (Fri), if FBG persistently >140s, increase to 18 units once daily Continue metformin  Reviewed s/sx/tx hypoglycemia Future Consideration: GLP1-RA: GI side effects on all agents. Patient notes he is unsure if this was just his undiagnosed UC at the time. Open to future retrial as needed. Safer options at this time.  DPP4i: Never tried. Reasonable option given less c/f GI ADE compared to GLP1.  SGLT2i: Reasonable. Never tried. Prior Hx UTI noted. Consider once A1c closer to goal or if elevated UACR on repeat.  SU: Not unreasonable, though defer given alternative options with lower risk of hypoglycemia while on insulin .  TZD: Avoiding due to possible weight gain/increase in fracture risk with only moderate A1c-lowering efficacy. Not unreasonable.     2. ASCVD (  primary prevention): uncontrolled on last lipid panel with LDL 96 mg/dL, TG 68 mg/dL (1/79/74). LDL goal <70 mg/dL (primary prevention, diabetes). Patient notes great concern with significant hx premature CVD on mother's side. Very reasonable to continue therapy as is vs. Increase atorvastatin  to 80 mg/d. Diet/exercise goals discussed today as starting point.  Key risk factors: diabetes, hypertension, hyperlipidemia, former smoker, sedentary lifestyle, and family history of premature CAD PREVENT: 10-Yr CVD 7.4%, 30-Yr CVD 29.8% indicates patient is at moderate risk.  Current Regimen: atorvastatin  40 mg daily Family hx: Mother heart attack in her 70s. All maternal relatives (males) passed away ~age 29s from heart attacks.  Continue medications today without changes.  Future Consideration: Atorvastatin  80 mg if LDL remains >70. Significant fam hx premature CAD/premature death due to MI. Patient open.    3. HTN:  controlled/borderline based on recent clinic BP of 127/78, 126/88 mmHg. Goal <130/80 mmHg. As above, consider small gradual goals regarding intentional movement/moderate exercise such as walking.  Current Regimen: losartan/hydrochlorothiazide  40/25 mg daily  Continue medications without changes   4. Healthcare Maintenance:  Pneumococcal - Current status: Up to date  Influenza - Current status: Up to date 2025-2026  Due to receive the following vaccines: Tdap, Covid booster    Follow Up Follow up with clinical pharmacist via phone in 1 week Work from home (flexible morning/afternoon most days) Patient given direct line for questions regarding medication therapy  Future Appointments  Date Time Provider Department Center  10/26/2024 10:30 AM Eappen, Saramma, MD [facility] None  10/30/2024  3:30 PM [facility] PHARMACIST [facility] [address]  11/29/2024  4:00 PM Jorge App, NP [facility] [address]  01/29/2025  8:00 AM Corina Norleen SAUNDERS, PsyD [facility] CPR  02/04/2025  4:00 PM Corina Norleen SAUNDERS, PsyD [facility] CPR    Manuelita FABIENE Kobs, PharmD Clinical Pharmacist [facility] [phone]

## 2024-10-23 NOTE — Patient Instructions (Addendum)
 Mr. CRISTHIAN VANHOOK,   It was a pleasure to speak with you today! As we discussed:?   Increase Tresiba  to 15 units once daily 10/28 10/31: If all morning sugars have remained over 140 mg/dL, you may increase Tresiba  again to 18 units once daily on Friday, 10/31 11/4: We will briefly touch base via phone call around 3:30 pm to review sugars/progress.   Continue metformin  XR 1000 mg twice daily  As you are able, consider slowly adding in intentional movement (this could be an after-dinner walk with the family, for example).   Dexcom: I forgot to mention, you may have already found it, but you can turn off the High blood sugar alarm on your Dexcom. I recommend this at least until we get your sugars to a lower range so the alarms don't drive you crazy.   What If My Sensor Falls Off or What If My Sensor Isn't Working? Dexcom will replace sensors that do not work./stop working. They will also replace some of the sensors that fall off early.  Dexcom Technical Support Line: [phone] Or submit a request via the online form: [email or web address]  >  Submit product support request   Blood Sugar Goals: Fasting blood sugar: Blood sugar level after 8 or more hours since last food (usually first thing in the morning before breakfast) Between 70 and 130 mg/dL  After meal blood sugar: 2 hours after finishing a meal Less than 180 mg/dL   How to treat low blood sugar:  A blood sugar is considered Low if it is less than 70 mg/dL (your Dexcom will beep if sugars reach 70 mg/dL) Rule of 15 For blood sugar less than 70: Treat with 15 grams of sugar (4 ounces of juice or regular soda, or with 3 to 4 glucose tablets) Re-check blood sugar after 15 minutes If blood sugar is still less than 70 on re-check, treat again and re-check in 15 minutes   Nutrition: Protein: Protein can help to stabilize the blood sugar May reduce how high the blood sugar spikes after eating May reduce  how quickly blood sugar falls (lowers risk of low blood sugars happening quickly) Plant-based Protein:  Oats/oatmeal are a good source of protein.  There are countless very easy and quick ways to prepare plain oats. Oatmeal, overnight oats in the fridge, baking oats in the oven Adding low-sugar protein powder and/or peanut butter powder can make oats more enjoyable.  Snacks: Microwavable Edamame, nuts, beans/legumes, lentils, low-sugar nut butters (or powdered peanut butter) Animal-Based Protein: Low-fat dairy Low-fat string cheese, sugar-free Greek yogurt (example Oikos), cottage cheese Eggs [employer] such as chicken breast, fish, turkey Meats that are grilled, boiled or baked (rather than fried) Protein powder:  There are plant-based powders or milk-based powders.  Mix in a smoothie, or mix with water/milk Protein powder is also very good mixed into oatmeal!   Fiber: Per the American Diabetes Association, Fiber is a carbohydrate (carb) the body can't break down when digesting food. Foods that are higher in fiber can help slow the breakdown of other carbs you eat, which may also help slow the rise in blood glucose. Note, serving size is key. If you consume 3 servings of whole-grain rice instead of 1 serving, this is still a carb and can spike your blood sugar. Rather, consider swapping out low-fiber foods like white rice or white bread for the higher-fiber options (whole-grain).  Plant-based Fibers:  Fruits and veggies (with the skin!) - cauliflower, green beans, 1/2  cup potato with skin, 1 cup broccoli, 1 apple with skin Nuts Whole grains (brown rice, whole wheat grains and pastas rather than white) Beans, peas, lentils    American Diabetes Association Plate Method    Please reach out prior to your next scheduled appointment should you have any questions or concerns.  You may reach me via MyChart Message, or you may leave me a voicemail at [phone] and I will get back  to you shortly.   Thank you!   Future Appointments  Date Time Provider Department Center  10/26/2024 10:30 AM Eappen, Saramma, MD [facility] None  10/30/2024  3:30 PM [facility] PHARMACIST [facility] [address]  11/29/2024  4:00 PM Gretel App, NP [facility] [address]  01/29/2025  8:00 AM Corina Norleen SAUNDERS, PsyD [facility]  02/04/2025  4:00 PM Rodenbough, Norleen SAUNDERS, PsyD [facility] CPR    Manuelita FABIENE Kobs, PharmD Clinical Pharmacist [facility] [phone]

## 2024-10-24 ENCOUNTER — Ambulatory Visit (HOSPITAL_COMMUNITY)

## 2024-10-24 ENCOUNTER — Ambulatory Visit: Payer: Self-pay | Admitting: Nurse Practitioner

## 2024-10-24 DIAGNOSIS — I152 Hypertension secondary to endocrine disorders: Secondary | ICD-10-CM

## 2024-10-24 DIAGNOSIS — K5100 Ulcerative (chronic) pancolitis without complications: Secondary | ICD-10-CM

## 2024-10-25 ENCOUNTER — Ambulatory Visit (HOSPITAL_COMMUNITY)

## 2024-10-26 ENCOUNTER — Ambulatory Visit (HOSPITAL_COMMUNITY)

## 2024-10-26 ENCOUNTER — Encounter: Payer: Self-pay | Admitting: Psychiatry

## 2024-10-26 ENCOUNTER — Telehealth: Admitting: Psychiatry

## 2024-10-26 DIAGNOSIS — F3175 Bipolar disorder, in partial remission, most recent episode depressed: Secondary | ICD-10-CM

## 2024-10-26 DIAGNOSIS — F1021 Alcohol dependence, in remission: Secondary | ICD-10-CM | POA: Diagnosis not present

## 2024-10-26 DIAGNOSIS — F410 Panic disorder [episodic paroxysmal anxiety] without agoraphobia: Secondary | ICD-10-CM | POA: Diagnosis not present

## 2024-10-26 NOTE — Progress Notes (Signed)
 Virtual Visit via Video Note  I connected with Jorge Wright on 10/26/24 at 10:30 AM EDT by a video enabled telemedicine application and verified that I am speaking with the correct person using two identifiers.  Location Provider Location : [facility] Patient Location : Home  Participants: Patient , Provider   I discussed the limitations of evaluation and management by telemedicine and the availability of in person appointments. The patient expressed understanding and agreed to proceed.  I discussed the assessment and treatment plan with the patient. The patient was provided an opportunity to ask questions and all were answered. The patient agreed with the plan and demonstrated an understanding of the instructions.   The patient was advised to call back or seek an in-person evaluation if the symptoms worsen or if the condition fails to improve as anticipated.    [facility] MD OP Progress Note  10/26/2024 11:00 AM Jorge Wright  MRN:  [id number]  Chief Complaint:  Chief Complaint  Patient presents with   Anxiety   Depression   Medication Refill   Discussed the use of AI scribe software for clinical note transcription with the patient, who gave verbal consent to proceed.  History of Present Illness Jorge Wright is a 38 year old Caucasian male who lives in [place], employed, currently going through divorce, has a history of bipolar disorder, panic disorder, alcohol use disorder in early remission, diabetes mellitus, ulcerative colitis, hyperlipidemia was evaluated by telemedicine today.  He reports significant improvement in mood and overall functioning since starting a new combination medication regimen of Auvelity that includes Wellbutrin and dextromethorphan, along with ongoing Latuda  and Lexapro  10 mg. He reports increased stability in mood, with no recent episodes of super lows or manic highs, and anxiety has subsided. He expresses satisfaction with his current mental state  and reports significant improvement since starting the new medication regimen. He denies experiencing any side effects, including tremors, muscle spasms or rigidity.  He reports that therapy with Ms.Ellouise Hummer continues to go well, with a focus on managing anger and stress. He missed a recent session due to a scheduling error but plans to meet with her next week. He notes that improvement in his relationship with his daughter has had a positive influence on his mood and well-being. He also reports that ongoing work-related stress feels more manageable with his current treatment and coping strategies.  He reports occasional alcohol use, most recently about a week ago during a social dinner, describing it as infrequent and typically limited to social situations such as a birthday party.  He denies any current suicidality, homicidality or perceptual disturbances.  Visit Diagnosis:    ICD-10-CM   1. Bipolar disorder, in partial remission, most recent episode depressed (HCC)  F31.75    Type II ,severe with anxious distress    2. Panic disorder  F41.0     3. Alcohol use disorder, severe, in early remission (HCC)  F10.21       Past Psychiatric History: I have reviewed past psychiatric history from progress note on 09/27/2023.  Past trials of medications like Wellbutrin, Lexapro , trazodone  Past Medical History:  Past Medical History:  Diagnosis Date   Chicken pox    COVID-19    12/2020 sob mild, 05/21/21   Depression    Diabetes mellitus without complication (HCC)    History of alcohol abuse    Hyperlipidemia    Hypertension    UC (ulcerative colitis) (HCC)     Past Surgical History:  Procedure Laterality Date   COLONOSCOPY WITH PROPOFOL  N/A 07/28/2022   Procedure: COLONOSCOPY WITH PROPOFOL ;  Surgeon: Toledo, Ladell POUR, MD;  Location: [facility] ENDOSCOPY;  Service: Gastroenterology;  Laterality: N/A;  OK'D PER PM   ESOPHAGOGASTRODUODENOSCOPY (EGD) WITH PROPOFOL  N/A 07/28/2022   Procedure:  ESOPHAGOGASTRODUODENOSCOPY (EGD) WITH PROPOFOL ;  Surgeon: Toledo, Ladell POUR, MD;  Location: [facility]COPY;  Service: Gastroenterology;  Laterality: N/A;   MANDIBLE FRACTURE SURGERY      Family Psychiatric History: I have reviewed family psychiatric history from progress note on 09/27/2023.  Family History:  Family History  Problem Relation Age of Onset   Bipolar disorder Mother    Alcohol abuse Mother    Hypertension Mother    Heart disease Mother        had heart attack 77    Diabetes Mother        type 2   Heart attack Mother 44   Alcohol abuse Father    Hypertension Father    Diabetes Father        type 1   Pancreatitis Father    Heart attack Maternal Uncle        had heart attack    Alcohol abuse Maternal Grandmother    Heart disease Maternal Grandmother        died in 17s    Hypertension Maternal Grandmother    Depression Maternal Grandmother    Stroke Paternal Grandfather     Social History: I have reviewed social history from progress note on 09/27/2023. Social History   Socioeconomic History   Marital status: Legally Separated    Spouse name: Not on file   Number of children: 1   Years of education: Not on file   Highest education level: Bachelor's degree (e.g., BA, AB, BS)  Occupational History   Not on file  Tobacco Use   Smoking status: Former   Smokeless tobacco: Former  Building Services Engineer status: Every Day   Substances: Nicotine  Substance and Sexual Activity   Alcohol use: Yes    Alcohol/week: 6.0 standard drinks of alcohol    Types: 6 Standard drinks or equivalent per week    Comment: occasional, last drink 1 month ago, has a hx of alcoholism   Drug use: Not Currently    Types: Marijuana    Comment: occasional edibles   Sexual activity: Yes  Other Topics Concern   Not on file  Social History Narrative   Works IT    separated   Kids daughter [name]    Social Drivers of Health   Financial Resource Strain: Low Risk  (12/22/2021)    Overall Financial Resource Strain (CARDIA)    Difficulty of Paying Living Expenses: Not hard at all  Food Insecurity: Not on file  Transportation Needs: Not on file  Physical Activity: Not on file  Stress: Not on file  Social Connections: Not on file    Allergies:  Allergies  Allergen Reactions   Mounjaro  [Tirzepatide ]     Injection site reaction    Ozempic  (0.25 Or 0.5 Mg-Dose) [Semaglutide (0.25 Or 0.5mg -Dos)]     N/v/diarrhea/ab pain    Trulicity  [Dulaglutide ]     Gi Sxs   Invokana  [Canagliflozin ] Rash    Reports topical rash on trunk that occurred after taking Invokana  for about a week. Resolved after medication discontinuation. No airway involvement.    Penicillins Rash    Metabolic Disorder Labs: Lab Results  Component Value Date   HGBA1C 11.1 (H) 10/10/2024   No  results found for: PROLACTIN Lab Results  Component Value Date   CHOL 154 08/15/2024   TRIG 68.0 08/15/2024   HDL 43.80 08/15/2024   CHOLHDL 4 08/15/2024   VLDL 13.6 08/15/2024   LDLCALC 96 08/15/2024   LDLCALC 111 (H) 12/31/2022   Lab Results  Component Value Date   TSH 0.66 07/04/2024   TSH 1.25 12/31/2022    Therapeutic Level Labs: No results found for: LITHIUM No results found for: VALPROATE No results found for: CBMZ  Current Medications: Current Outpatient Medications  Medication Sig Dispense Refill   Na Sulfate-K Sulfate-Mg Sulfate concentrate (SUPREP) 17.5-3.13-1.6 GM/177ML SOLN Take 1 Bottle by mouth.     atorvastatin  (LIPITOR) 40 MG tablet Take 1 tablet (40 mg total) by mouth daily. 90 tablet 3   benztropine  (COGENTIN ) 1 MG tablet Take 1 tablet (1 mg total) by mouth at bedtime as needed for tremors. 30 tablet 1   Continuous Glucose Sensor (DEXCOM G7 SENSOR) MISC Use as directed to monitor blood glucose. Change sensor every 10 days. 3 each 11   Dextromethorphan-buPROPion ER (AUVELITY) 45-105 MG TBCR Take 1 tablet by mouth in the morning. 30 tablet 1   escitalopram  (LEXAPRO ) 10  MG tablet Take 1 tablet (10 mg total) by mouth daily with breakfast. Stop the 20 mg daily 30 tablet 1   fenofibrate  (TRICOR ) 145 MG tablet Take 1 tablet (145 mg total) by mouth daily. 90 tablet 3   hydrOXYzine  (VISTARIL ) 25 MG capsule Take 1 capsule (25 mg total) by mouth 4 (four) times daily as needed. For severe anxiety 120 capsule 2   insulin  degludec (TRESIBA ) 100 UNIT/ML FlexTouch Pen Inject 10 Units into the skin daily. 3 mL 5   lisinopril -hydrochlorothiazide  (ZESTORETIC ) 20-12.5 MG tablet TAKE 2 TABLETS DAILY IN THE MORNING 180 tablet 3   lurasidone  (LATUDA ) 40 MG TABS tablet Take 1 tablet (40 mg total) by mouth daily with breakfast. 30 tablet 1   metFORMIN  (GLUCOPHAGE -XR) 500 MG 24 hr tablet Take 2 tablets (1,000 mg total) by mouth 2 (two) times daily with a meal. 360 tablet 3   ramelteon  (ROZEREM ) 8 MG tablet Take 1 tablet (8 mg total) by mouth at bedtime. 30 tablet 4   No current facility-administered medications for this visit.     Musculoskeletal: Strength & Muscle Tone: UTA Gait & Station: Seated Patient leans: N/A  Psychiatric Specialty Exam: Review of Systems  Psychiatric/Behavioral:  Positive for decreased concentration and dysphoric mood (Improving).     There were no vitals taken for this visit.There is no height or weight on file to calculate BMI.  General Appearance: Casual  Eye Contact:  Fair  Speech:  Clear and Coherent  Volume:  Normal  Mood:  Depressed improving  Affect:  Appropriate  Thought Process:  Goal Directed and Descriptions of Associations: Intact  Orientation:  Full (Time, Place, and Person)  Thought Content: Logical   Suicidal Thoughts:  No  Homicidal Thoughts:  No  Memory:  Immediate;   Fair Recent;   Fair Remote;   Fair  Judgement:  Fair  Insight:  Fair  Psychomotor Activity:  Normal  Concentration:  Concentration: Fair and Attention Span: Fair  Recall:  [employer] of Knowledge: Fair  Language: Fair  Akathisia:  No  Handed:  Right   AIMS (if indicated): not done  Assets:  Communication Skills Desire for Improvement Housing Social Support  ADL's:  Intact  Cognition: WNL  Sleep:  Fair   Screenings: AIMS    Flowsheet  Row Office Visit from [facility]/23/2024 in [facility] Office Visit from 11/14/2023 in [facility]  AIMS Total Score 0 0   GAD-7    Flowsheet Row Office Visit from 10/10/2024 in [facility] Visit from 09/27/2024 in [facility] Office Visit from 09/05/2024 in [facility] Office Visit from 07/04/2024 in [facility] Visit from 11/14/2023 in [facility]  Total GAD-7 Score 14 13 20 19 17    PHQ2-9    Flowsheet Row Video Visit from 10/26/2024 in [facility] Office Visit from 10/10/2024 in [facility] Visit from 09/27/2024 in [facility] Office Visit from 09/05/2024 in [facility] Office Visit from 07/04/2024 in [facility] Total Score 2 5 4 6 4   PHQ-9 Total Score 9 18 17 21 18    Flowsheet Row Video Visit from 10/26/2024 in [facility] Office Visit from 09/27/2024 in [facility] Office Visit from 09/05/2024 in [facility]  C-SSRS RISK CATEGORY Low Risk Low Risk Low Risk     Assessment and Plan: Jorge Wright is a 38 year old Caucasian male who presented for a follow-up appointment, discussed assessment and plan as noted below.  1. Bipolar disorder, in partial remission, most recent episode  depressed (HCC) Currently denies any significant anxiety and reports depression symptoms as improved.  Good response to recent addition of Auvelity. Continue Latuda  40 mg daily Continue Auvelity 45-105 mg daily in the morning Continue Rozerem  8 mg daily at bedtime Continue Benztropine  1 mg at bedtime as needed for side effects of Latuda .  2. Panic disorder-improving Currently reports anxiety symptoms as manageable and denies any recent panic attacks Continue Lexapro  at reduced dosage of 10 mg daily. Continue CBT with Ms. Ellouise Hummer Continue Hydroxyzine  25 mg 4 times a day as needed.  3. Alcohol use disorder, severe, in early remission (HCC) Reports partial remission with alcohol use with ongoing social use with most recent use a week ago socially. Continue AA meetings Continue CBT  Follow-up Follow-up in clinic in 3 to 4 weeks or sooner.      Collaboration of Care: Collaboration of Care: Referral or follow-up with counselor/therapist AEB encouraged to continue psychotherapy sessions.  Patient/Guardian was advised Release of Information must be obtained prior to any record release in order to collaborate their care with an outside provider. Patient/Guardian was advised if they have not already done so to contact the registration department to sign all necessary forms in order for us  to release information regarding their care.   Consent: Patient/Guardian gives verbal consent for treatment and assignment of benefits for services provided during this visit. Patient/Guardian expressed understanding and agreed to proceed.   This note was generated in part or whole with voice recognition software. Voice recognition is usually quite accurate but there are transcription errors that can and very often do occur. I apologize for any typographical errors that were not detected and corrected.    Cobie Marcoux, MD 10/26/2024, 11:00 AM

## 2024-10-29 ENCOUNTER — Other Ambulatory Visit (INDEPENDENT_AMBULATORY_CARE_PROVIDER_SITE_OTHER)

## 2024-10-29 DIAGNOSIS — K5100 Ulcerative (chronic) pancolitis without complications: Secondary | ICD-10-CM

## 2024-10-29 DIAGNOSIS — I152 Hypertension secondary to endocrine disorders: Secondary | ICD-10-CM | POA: Diagnosis not present

## 2024-10-29 DIAGNOSIS — E1159 Type 2 diabetes mellitus with other circulatory complications: Secondary | ICD-10-CM | POA: Diagnosis not present

## 2024-10-29 LAB — BASIC METABOLIC PANEL WITH GFR
BUN: 14 mg/dL (ref 6–23)
CO2: 24 meq/L (ref 19–32)
Calcium: 9 mg/dL (ref 8.4–10.5)
Chloride: 98 meq/L (ref 96–112)
Creatinine, Ser: 1.15 mg/dL (ref 0.40–1.50)
GFR: 80.16 mL/min (ref >60.00)
Glucose, Bld: 296 mg/dL — ABNORMAL HIGH (ref 70–99)
Potassium: 4.4 meq/L (ref 3.5–5.1)
Sodium: 132 meq/L — ABNORMAL LOW (ref 135–145)

## 2024-10-29 LAB — CBC WITH DIFFERENTIAL/PLATELET
Basophils Absolute: 0 K/uL (ref 0.0–0.1)
Basophils Relative: 0.4 % (ref 0.0–3.0)
Eosinophils Absolute: 0 K/uL (ref 0.0–0.7)
Eosinophils Relative: 0.3 % (ref 0.0–5.0)
HCT: 36.4 % — ABNORMAL LOW (ref 39.0–52.0)
Hemoglobin: 11.9 g/dL — ABNORMAL LOW (ref 13.0–17.0)
Lymphocytes Relative: 21.3 % (ref 12.0–46.0)
Lymphs Abs: 1.8 K/uL (ref 0.7–4.0)
MCHC: 32.8 g/dL (ref 30.0–36.0)
MCV: 85.4 fl (ref 78.0–100.0)
Monocytes Absolute: 0.6 K/uL (ref 0.1–1.0)
Monocytes Relative: 7.1 % (ref 3.0–12.0)
Neutro Abs: 5.9 K/uL (ref 1.4–7.7)
Neutrophils Relative %: 70.9 % (ref 43.0–77.0)
Platelets: 306 K/uL (ref 150.0–400.0)
RBC: 4.27 Mil/uL (ref 4.22–5.81)
RDW: 14.7 % (ref 11.5–15.5)
WBC: 8.4 K/uL (ref 4.0–10.5)

## 2024-10-30 ENCOUNTER — Other Ambulatory Visit: Payer: Self-pay | Admitting: Psychiatry

## 2024-10-30 ENCOUNTER — Other Ambulatory Visit

## 2024-10-30 DIAGNOSIS — F3181 Bipolar II disorder: Secondary | ICD-10-CM

## 2024-10-30 DIAGNOSIS — F410 Panic disorder [episodic paroxysmal anxiety] without agoraphobia: Secondary | ICD-10-CM

## 2024-10-30 DIAGNOSIS — Z794 Long term (current) use of insulin: Secondary | ICD-10-CM

## 2024-10-30 MED ORDER — INSULIN PEN NEEDLE 32G X 6 MM MISC: 3 refills | Status: AC

## 2024-10-30 MED ORDER — INSULIN DEGLUDEC 100 UNIT/ML ~~LOC~~ SOPN: PEN_INJECTOR | SUBCUTANEOUS | 2 refills | Status: AC

## 2024-10-30 NOTE — Progress Notes (Unsigned)
 10/30/2024 Name: Jorge Wright MRN: [id number] DOB: [birth year]  Subjective  Chief Complaint  Patient presents with   Diabetes   Reason for visit: ?  [name] is a 38 y.o. male with a history of diabetes (type 2), who presents today for a follow up diabetes pharmacotherapy visit. They were referred by their PCP for management of diabetes.  Pertinent PMH also includes HTN, chronic gastritis/colitis, hepatic steatosis, HLD, Anx/depression.  Known DM Complications: none   Care Team: Primary Care Provider: Gretel App, NP   Date of Last Diabetes Related Visit: with PCP on 10/10/24, 10/28 with PharmD Recent Summary of Change: 10/28: ??Tresiba  form 12u to 15u/d  10/15: Inconsistent with insulin  and metformin  use. A1c increased 11.1%. Increase MTF to 1000 mg BID  Medication Access/Adherence: Prescription drug coverage: Payor: [employer] / Plan: [employer] NAP / Product Type: *No Product type* /  - Reports that all medications are affordable. Dexcom was well-covered. GLP1 prior were well-covered.    Since Last visit / History of Present Illness: ?  Patient reports implementing plan from last visit. Denies adverse effects with titration of insulin .   Has tried GLP1s in the past though with questionable GI side effects. Retrospectively, patient feels it is unclear if related to medicine vs later-diagnosed UC.  Reported DM Regimen: ?  Metformin  XR 1000 mg twice daily Tresiba  18 units once daily (night)   DM medications tried in the past:?  Ozempic  (titrated to 1.0 mg/wk, d/c 2023) Mounjaro  (titrated to 7.5 mg/wk, d/c Dec 2022) Trulicity  (titrated to 1.5 mg/wk) Invokana  (topical rash on trunk that occurred after taking Invokana  for about a week. Resolved after medication discontinuation. No airway involvement)  SMBG: Dexcom G7  Placed first sensor ~1 week ago.  CGM Report (Date Range 10/29 - 11/4) ABG 337 mg/dL; GMI 88.5%; Variability 16.1%; TIR 0%, high 6%, very high 94%, low  0%, very low 0%      Hypo/Hyperglycemia: ?  Symptoms of hypoglycemia since last visit:? no  Symptoms of hyperglycemia since last visit:? no  Reported Diet: Eat what I want. Mostly meat and veggies. Feels greatest barrier may be pastas/rice. Minimal sweets.   Exercise: No formal exercise. Moves a lot, constantly up and down stairs.   DM Prevention:  Statin: Taking; high intensity.?  History of chronic kidney disease? no History of albuminuria? no, last UACR on 2022 = 5 mg/g ACE/ARB - Taking lisinopril  40mg ; Urine MA/CR Ratio - historically <30 mg/g - DUE.  Last eye exam: 12/06/2023; No retinopathy present Last foot exam: No foot exam found Tobacco Use: Former cigarette (quite ~age 19). Currently vapes  Immunizations:? Flu: Up to date (Last: 10/10/2024); Pneumococcal: PPSV23 (2014, 2015) PCV13 (2023); Tdap; Due (Last: 2013)  Cardiovascular Risk Reduction History of clinical ASCVD? no Fam Hx: Premature CAD in mother. Premature death (age 33s) several male relatives on maternal side from MI.  History of heart failure? no History of hyperlipidemia? yes Current BMI: 27.7 kg/m2 (Ht 6'1, Wt 95.5 kg) Taking statin? yes; high intensity (atorvastatin  40 mg daily) Taking aspirin? not indicated; Not taking   Taking SGLT-2i? no Taking GLP- 1 RA? no      _______________________________________________  Objective    Review of Systems:? Limited in the setting of virtual visit Constitutional:? No fever, chills or unintentional weight loss  Endocrine:? No polyuria, polyphagia or blurred vision    Physical Examination:  Vitals:  Wt Readings from Last 3 Encounters:  10/10/24 210 lb 9.6 oz (95.5 kg)  09/27/24 209  lb (94.8 kg)  09/05/24 212 lb 3.2 oz (96.3 kg)   BP Readings from Last 3 Encounters:  10/10/24 126/88  09/27/24 127/78  09/05/24 (!) 145/98   Pulse Readings from Last 3 Encounters:  10/10/24 94  09/27/24 72  09/05/24 (!) 102     Labs:?  Lab Results  Component Value  Date   HGBA1C 11.1 (H) 10/10/2024   HGBA1C 10.3 (H) 07/04/2024   HGBA1C 8.5 (A) 07/01/2023   HGBA1C 8.5 07/01/2023   HGBA1C 8.5 (A) 07/01/2023   HGBA1C 8.5 (A) 07/01/2023   GLUCOSE 296 (H) 10/29/2024   MICRALBCREAT 5 07/31/2021   MICRALBCREAT 3 07/08/2020   CREATININE 1.15 10/29/2024   CREATININE 1.03 10/10/2024   CREATININE 1.09 07/04/2024   GFR 80.16 10/29/2024   GFR 91.53 10/10/2024   GFR 85.68 07/04/2024   Lab Results  Component Value Date   CHOL 154 08/15/2024   LDLCALC 96 08/15/2024   LDLCALC 111 (H) 12/31/2022   LDLCALC 79 07/03/2019   LDLDIRECT 71.0 02/01/2022   LDLDIRECT 93.0 07/31/2021   HDL 43.80 08/15/2024   TRIG 68.0 08/15/2024   TRIG 90.0 12/31/2022   TRIG 337.0 (H) 02/01/2022   ALT 16 08/15/2024   ALT 37 07/04/2024   AST 11 08/15/2024   AST 16 07/04/2024      Chemistry      Component Value Date/Time   NA 132 (L) 10/29/2024 0902   K 4.4 10/29/2024 0902   CL 98 10/29/2024 0902   CO2 24 10/29/2024 0902   BUN 14 10/29/2024 0902   CREATININE 1.15 10/29/2024 0902      Component Value Date/Time   CALCIUM  9.0 10/29/2024 0902   ALKPHOS 59 08/15/2024 0814   AST 11 08/15/2024 0814   ALT 16 08/15/2024 0814   BILITOT 0.4 08/15/2024 0814   BILITOT 0.2 07/11/2017 0000     The ASCVD Risk score (Arnett DK, et al., 2019) failed to calculate for the following reasons:   The 2019 ASCVD risk score is only valid for ages 10 to 35  Assessment and Plan:   1. Diabetes, type 2: uncontrolled per last A1c of 11.1% (10/15), increased from previous, 10.3%. Goal <7% without hypoglycemia. Doing well on 18u basal insulin . Persistent hyperglycemia, though starting to note small improvement. FBG remain >200 mg/dL, no significant variation in sugars noted throughout the day/night. Proceed w 20% basal titration for severe hyperglycemia.  Current Regimen: metfomrin XR 1000 mg BID, Tresiba  18 u/d Increase Tresiba  from 18 units to 21 units once daily (20% increase) After 3 days  (Fri), if FBG persistently >150s, increase to 25 units once daily Continue metformin  Reviewed s/sx/tx hypoglycemia Future Consideration: GLP1-RA: GI side effects on all agents. Patient notes he is unsure if this was just his undiagnosed UC at the time. Open to future retrial as needed. Safer options at this time.  DPP4i: Never tried. Reasonable option given less c/f GI ADE compared to GLP1.  SGLT2i: Reasonable. Never tried. Prior Hx UTI noted. Consider once A1c closer to goal or if elevated UACR on repeat.  SU: Not unreasonable, though defer given alternative options with lower risk of hypoglycemia while on insulin .  TZD: Avoiding due to possible weight gain/increase in fracture risk with only moderate A1c-lowering efficacy. Not unreasonable.     Follow Up Follow up with clinical pharmacist via phone in 1 week Work from home (flexible morning/afternoon most days) Patient given direct line for questions regarding medication therapy  Future Appointments  Date Time Provider Department Center  11/06/2024  3:30 PM [facility] PHARMACIST [facility] [address]  11/19/2024  9:00 AM Coby Height, MD [facility] None  11/29/2024  4:00 PM Jorge App, NP [facility] [address]  01/29/2025  8:00 AM Corina Norleen SAUNDERS, PsyD [facility]  02/04/2025  4:00 PM Rodenbough, Norleen SAUNDERS, PsyD [facility]    Manuelita FABIENE Kobs, PharmD Clinical Pharmacist [facility] [phone]

## 2024-10-30 NOTE — Patient Instructions (Addendum)
 Mr. Jorge Wright,   It was a pleasure to speak with you today! As we discussed:?   This week: A refill has been sent to [facility] for Tresiba  and for pen needles.  Today, Increase Tresiba  from 18 units to 21 units once daily (20% increase) After 3 days (Fri), if fasting morning sugars are consistently over 150, increase Tresiba  again to 25 units once daily through the weekend.  Future: Once we see sugars closer to goal (I.e. morning sugars consistently 130s-150s), we can proceed with a simpler titration approach: increase Tresiba  dose by +2 units every 2-3 days for fasting morning sugars consistently over 130 mg/dL.    If you have any questions or issues with refills at the pharmacy, you may respond directly to this message, or leave me a voicemail at [phone] and I will get back to you shortly.   Otherwise, I will set a reminder to check in with you next week for insulin  instructions.

## 2024-10-31 ENCOUNTER — Ambulatory Visit: Payer: Self-pay | Admitting: Nurse Practitioner

## 2024-11-06 ENCOUNTER — Other Ambulatory Visit

## 2024-11-19 ENCOUNTER — Telehealth (INDEPENDENT_AMBULATORY_CARE_PROVIDER_SITE_OTHER): Admitting: Psychiatry

## 2024-11-19 ENCOUNTER — Encounter: Payer: Self-pay | Admitting: Psychiatry

## 2024-11-19 DIAGNOSIS — F410 Panic disorder [episodic paroxysmal anxiety] without agoraphobia: Secondary | ICD-10-CM

## 2024-11-19 DIAGNOSIS — F3176 Bipolar disorder, in full remission, most recent episode depressed: Secondary | ICD-10-CM

## 2024-11-19 DIAGNOSIS — F1020 Alcohol dependence, uncomplicated: Secondary | ICD-10-CM | POA: Diagnosis not present

## 2024-11-19 MED ORDER — NALTREXONE HCL 50 MG PO TABS: 50.0000 mg | ORAL_TABLET | Freq: Every day | ORAL | 1 refills | Status: AC

## 2024-11-19 MED ORDER — LURASIDONE HCL 40 MG PO TABS: 40.0000 mg | ORAL_TABLET | Freq: Every day | ORAL | 4 refills | Status: AC

## 2024-11-19 NOTE — Progress Notes (Unsigned)
 Virtual Visit via Video Note  I connected with Jorge Wright on 11/19/24 at  9:00 AM EST by a video enabled telemedicine application and verified that I am speaking with the correct person using two identifiers.  Location Provider Location : [facility] Patient Location : Home  Participants: Patient , Provider   I discussed the limitations of evaluation and management by telemedicine and the availability of in person appointments. The patient expressed understanding and agreed to proceed.    I discussed the assessment and treatment plan with the patient. The patient was provided an opportunity to ask questions and all were answered. The patient agreed with the plan and demonstrated an understanding of the instructions.   The patient was advised to call back or seek an in-person evaluation if the symptoms worsen or if the condition fails to improve as anticipated.   BH MD OP Progress Note  11/19/2024 10:35 AM Jorge Wright  MRN:  [id number]  Chief Complaint:  Chief Complaint  Patient presents with   Follow-up   Anxiety   Depression   Medication Refill   Discussed the use of AI scribe software for clinical note transcription with the patient, who gave verbal consent to proceed.  History of Present Illness Jorge Wright is a 38 year old Caucasian male who lives in [place], employed, currently going through divorce, has a history of bipolar disorder, panic disorder, alcohol use disorder in early remission, diabetes mellitus, ulcerative colitis, hyperlipidemia was evaluated by telemedicine today.  Since starting the Auvelity  combination, which includes Wellbutrin and dextromethorphan, he reports overall improvement in mood and functioning. He expresses satisfaction with his current medication regimen and describes feeling leaps and bounds ahead of where he started. Motivation continues to be a challenge, as he describes difficulty initiating tasks and maintaining focus, with  attention and productivity remaining sporadic.  Current medications include Auvelity , Latuda , and Lexapro  10 mg. He states that he forgot to take Lexapro  during a recent trip to [place] and has not taken it for 8 days, but reports feeling okay.   He denies thoughts of harming himself or others.   He reports sleep has been pretty good on the Rozerem .  Denies side effects.  He describes current alcohol use as hit and miss, with some days consuming a few beers and other days abstaining. He denies liquor use, stating consumption is limited to beer. When drinking, he typically continues until going to sleep and reports difficulty stopping once he starts drinking.  He is interested in going back on naltrexone  which worked well previously.   Visit Diagnosis:    ICD-10-CM   1. Bipolar disorder, in full remission, most recent episode depressed  F31.76 lurasidone  (LATUDA ) 40 MG TABS tablet   Type II severe ,with anxious distress    2. Panic disorder  F41.0     3. Alcohol use disorder, severe, dependence (HCC)  F10.20 naltrexone  (DEPADE) 50 MG tablet      Past Psychiatric History: I have reviewed past psychiatric history from progress note on 09/27/2023.  Past trials of medications like Wellbutrin, Lexapro , trazodone.  Past Medical History:  Past Medical History:  Diagnosis Date   Chicken pox    COVID-19    12/2020 sob mild, 05/21/21   Depression    Diabetes mellitus without complication (HCC)    History of alcohol abuse    Hyperlipidemia    Hypertension    UC (ulcerative colitis) (HCC)     Past Surgical History:  Procedure Laterality Date  COLONOSCOPY WITH PROPOFOL  N/A 07/28/2022   Procedure: COLONOSCOPY WITH PROPOFOL ;  Surgeon: Toledo, Ladell POUR, MD;  Location: [facility] END[facility]COPY;  Service: Gastroenterology;  Laterality: N/A;  OK'D PER PM   ESOPHAGOGASTRODUODENOSCOPY (EGD) WITH PROPOFOL  N/A 07/28/2022   Procedure: ESOPHAGOGASTRODUODENOSCOPY (EGD) WITH PROPOFOL ;  Surgeon: Toledo, Ladell POUR, MD;  Location: [facility] END[facility]COPY;  Service: Gastroenterology;  Laterality: N/A;   MANDIBLE FRACTURE SURGERY      Family Psychiatric History: I have reviewed family psychiatric history from progress note on 09/27/2023.  Family History:  Family History  Problem Relation Age of Onset   Bipolar disorder Mother    Alcohol abuse Mother    Hypertension Mother    Heart disease Mother        had heart attack 53    Diabetes Mother        type 2   Heart attack Mother 25   Alcohol abuse Father    Hypertension Father    Diabetes Father        type 1   Pancreatitis Father    Heart attack Maternal Uncle        had heart attack    Alcohol abuse Maternal Grandmother    Heart disease Maternal Grandmother        died in 20s    Hypertension Maternal Grandmother    Depression Maternal Grandmother    Stroke Paternal Grandfather     Social History: I have reviewed social history from progress note on 09/27/2023. Social History   Socioeconomic History   Marital status: Legally Separated    Spouse name: Not on file   Number of children: 1   Years of education: Not on file   Highest education level: Bachelor's degree (e.g., BA, AB, BS)  Occupational History   Not on file  Tobacco Use   Smoking status: Former   Smokeless tobacco: Former  Building Services Engineer status: Every Day   Substances: Nicotine  Substance and Sexual Activity   Alcohol use: Yes    Alcohol/week: 6.0 standard drinks of alcohol    Types: 6 Standard drinks or equivalent per week    Comment: occasional, last drink 1 month ago, has a hx of alcoholism   Drug use: Not Currently    Types: Marijuana    Comment: occasional edibles   Sexual activity: Yes  Other Topics Concern   Not on file  Social History Narrative   Works IT    separated   Kids daughter Jorge Wright    Social Drivers of Health   Financial Resource Strain: Low Risk  (12/22/2021)   Overall Financial Resource Strain (CARDIA)    Difficulty of Paying Living  Expenses: Not hard at all  Food Insecurity: Not on file  Transportation Needs: Not on file  Physical Activity: Not on file  Stress: Not on file  Social Connections: Not on file    Allergies:  Allergies  Allergen Reactions   Mounjaro  [Tirzepatide ]     Injection site reaction    Ozempic  (0.25 Or 0.5 Mg-Dose) [Semaglutide (0.25 Or 0.5mg -Dos)]     N/v/diarrhea/ab pain    Trulicity  [Dulaglutide ]     Gi Sxs   Invokana  [Canagliflozin ] Rash    Reports topical rash on trunk that occurred after taking Invokana  for about a week. Resolved after medication discontinuation. No airway involvement.    Penicillins Rash    Metabolic Disorder Labs: Lab Results  Component Value Date   HGBA1C 11.1 (H) 10/10/2024   No results found for: PROLACTIN Lab  Results  Component Value Date   CHOL 154 08/15/2024   TRIG 68.0 08/15/2024   HDL 43.80 08/15/2024   CHOLHDL 4 08/15/2024   VLDL 13.6 08/15/2024   LDLCALC 96 08/15/2024   LDLCALC 111 (H) 12/31/2022   Lab Results  Component Value Date   TSH 0.66 07/04/2024   TSH 1.25 12/31/2022    Therapeutic Level Labs: No results found for: LITHIUM No results found for: VALPROATE No results found for: CBMZ  Current Medications: Current Outpatient Medications  Medication Sig Dispense Refill   naltrexone  (DEPADE) 50 MG tablet Take 1 tablet (50 mg total) by mouth daily. 30 tablet 1   atorvastatin  (LIPITOR) 40 MG tablet Take 1 tablet (40 mg total) by mouth daily. 90 tablet 3   AUVELITY  45-105 MG TBCR TAKE 1 TABLET BY MOUTH IN THE MORNING. 30 tablet 1   benztropine  (COGENTIN ) 1 MG tablet Take 1 tablet (1 mg total) by mouth at bedtime as needed for tremors. 30 tablet 1   Continuous Glucose Sensor (DEXCOM G7 SENSOR) MISC Use as directed to monitor blood glucose. Change sensor every 10 days. 3 each 11   fenofibrate  (TRICOR ) 145 MG tablet Take 1 tablet (145 mg total) by mouth daily. 90 tablet 3   hydrOXYzine  (VISTARIL ) 25 MG capsule Take 1 capsule (25  mg total) by mouth 4 (four) times daily as needed. For severe anxiety 120 capsule 2   insulin  degludec (TRESIBA ) 100 UNIT/ML FlexTouch Pen Inject 20-40 units sq once daily. Increase only as instructed to MDD 40 units. 12 mL 2   Insulin  Pen Needle 32G X 6 MM MISC Use to inject insulin  once daily as instructed. May substitute any covered brand/quantity. 100 each 3   lisinopril -hydrochlorothiazide  (ZESTORETIC ) 20-12.5 MG tablet TAKE 2 TABLETS DAILY IN THE MORNING 180 tablet 3   lurasidone  (LATUDA ) 40 MG TABS tablet Take 1 tablet (40 mg total) by mouth daily with breakfast. 30 tablet 4   metFORMIN  (GLUCOPHAGE -XR) 500 MG 24 hr tablet Take 2 tablets (1,000 mg total) by mouth 2 (two) times daily with a meal. 360 tablet 3   Na Sulfate-K Sulfate-Mg Sulfate concentrate (SUPREP) 17.5-3.13-1.6 GM/177ML SOLN Take 1 Bottle by mouth.     ramelteon  (ROZEREM ) 8 MG tablet Take 1 tablet (8 mg total) by mouth at bedtime. 30 tablet 4   No current facility-administered medications for this visit.     Musculoskeletal: Strength & Muscle Tone: UTA Gait & Station: Seated Patient leans: N/A  Psychiatric Specialty Exam: Review of Systems  Psychiatric/Behavioral:         Lack of motivation    There were no vitals taken for this visit.There is no height or weight on file to calculate BMI.  General Appearance: Casual  Eye Contact:  Fair  Speech:  Clear and Coherent  Volume:  Normal  Mood:  Euthymic  Affect:  Appropriate  Thought Process:  Goal Directed and Descriptions of Associations: Intact  Orientation:  Full (Time, Place, and Person)  Thought Content: Logical   Suicidal Thoughts:  No  Homicidal Thoughts:  No  Memory:  Immediate;   Fair Recent;   Fair Remote;   Fair  Judgement:  Fair  Insight:  Fair  Psychomotor Activity:  Normal  Concentration:  Concentration: Fair and Attention Span: Fair  Recall:  [employer] of Knowledge: Fair  Language: Fair  Akathisia:  No  Handed:  Right  AIMS (if  indicated): not done  Assets:  Communication Skills Desire for Improvement Housing Social Support  ADL's:  Intact  Cognition: WNL  Sleep:  Fair   Screenings: Midwife Visit from 01/23/2024 in [facility] Office Visit from 11/14/2023 in [facility]  AIMS Total Score 0 0   GAD-7    Flowsheet Row Office Visit from 10/10/2024 in [facility] Visit from 09/27/2024 in [facility] Office Visit from 09/05/2024 in [facility] Office Visit from 07/04/2024 in [facility] Visit from 11/14/2023 in [facility]  Total GAD-7 Score 14 13 20 19 17    PHQ2-9    Flowsheet Row Video Visit from 10/26/2024 in [facility] Office Visit from 10/10/2024 in [facility] Visit from 09/27/2024 in [facility] Office Visit from 09/05/2024 in [facility] Office Visit from 07/04/2024 in [facility] Total Score 2 5 4 6 4   PHQ-9 Total Score 9 18 17 21 18    Flowsheet Row Video Visit from 11/19/2024 in [facility] Video Visit from 10/26/2024 in [facility] Office Visit from 09/27/2024 in [facility]  C-SSRS RISK CATEGORY No Risk Low Risk Low Risk     Assessment and Plan: Jorge Wright is a 38 year old Caucasian male who presented for a follow-up appointment, discussed assessment and plan as noted below.    1. Bipolar disorder, in full remission, most recent episode  depressed Currently reports overall improvement in mood symptoms although continues to struggle with lack of motivation.  Currently tolerating current medications well. Continue Latuda  40 mg daily Continue Benztropine  1 mg at bedtime as needed for side effects of Latuda  Continue Auvelity  45-105 mg daily in the morning Continue Rozerem  8 mg at bedtime  2. Panic disorder-improving Currently denies any significant anxiety/panic attacks.  Stopped taking Lexapro  few days ago.  Denies any resurgence of anxiety since stopping it or withdrawal symptoms. Discontinue Lexapro  for noncompliance Continue Hydroxyzine  25 mg 4 times a day as needed Continue CBT with Ms. Ellouise Hummer  3. Alcohol use disorder, severe, dependence (HCC)-unstable Ongoing use of alcohol on and off.  Interested in retrial of naltrexone  which helped in the past. Continue CBT Restart Naltrexone  50 mg daily Provided medication education  Follow-up Follow-up in clinic in 4 weeks or sooner if needed.  Collaboration of Care: Collaboration of Care: Referral or follow-up with counselor/therapist AEB encouraged to continue psychotherapy sessions with Ms. Ellouise Hummer.  Patient/Guardian was advised Release of Information must be obtained prior to any record release in order to collaborate their care with an outside provider. Patient/Guardian was advised if they have not already done so to contact the registration department to sign all necessary forms in order for us  to release information regarding their care.   Consent: Patient/Guardian gives verbal consent for treatment and assignment of benefits for services provided during this visit. Patient/Guardian expressed understanding and agreed to proceed.   This note was generated in part or whole with voice recognition software. Voice recognition is usually quite accurate but there are transcription errors that can and very often do occur. I apologize for any typographical errors that  were not detected and corrected.    Jorge Panning, MD 11/20/2024, 11:27 AM

## 2024-11-29 ENCOUNTER — Telehealth: Payer: Self-pay

## 2024-11-29 ENCOUNTER — Ambulatory Visit: Admitting: Nurse Practitioner

## 2024-11-29 NOTE — Telephone Encounter (Signed)
 E2C2 IT IS OKAY TO RELAY MSG AS WELL AS PATCH PATIENT THROUGH TO OFFICE IF OKAY WITH STARTING APPT   Detailed vm left informing pt to CB as well as a mychart msg sent.   Was calling in regards to virtual appt today at 4pm provider is leaving early today and wanted to have pt seen this morning if possible for him

## 2024-11-30 ENCOUNTER — Ambulatory Visit

## 2024-11-30 DIAGNOSIS — K5289 Other specified noninfective gastroenteritis and colitis: Secondary | ICD-10-CM | POA: Diagnosis present

## 2024-12-19 ENCOUNTER — Telehealth: Admitting: Psychiatry

## 2024-12-19 ENCOUNTER — Encounter: Payer: Self-pay | Admitting: Psychiatry

## 2024-12-19 DIAGNOSIS — F1020 Alcohol dependence, uncomplicated: Secondary | ICD-10-CM | POA: Diagnosis not present

## 2024-12-19 DIAGNOSIS — F3176 Bipolar disorder, in full remission, most recent episode depressed: Secondary | ICD-10-CM

## 2024-12-19 DIAGNOSIS — F410 Panic disorder [episodic paroxysmal anxiety] without agoraphobia: Secondary | ICD-10-CM

## 2024-12-19 MED ORDER — AUVELITY 45-105 MG PO TBCR: 1.0000 | EXTENDED_RELEASE_TABLET | Freq: Every morning | ORAL | 5 refills | Status: AC

## 2024-12-19 NOTE — Progress Notes (Signed)
 Virtual Visit via Video Note  I connected with Jorge Wright on 12/19/2024 at  9:30 AM EST by a video enabled telemedicine application and verified that I am speaking with the correct person using two identifiers. Location Provider Location : [facility] Patient Location : Home  Participants: Patient , Provider   I discussed the limitations of evaluation and management by telemedicine and the availability of in person appointments. The patient expressed understanding and agreed to proceed.   I discussed the assessment and treatment plan with the patient. The patient was provided an opportunity to ask questions and all were answered. The patient agreed with the plan and demonstrated an understanding of the instructions.   The patient was advised to call back or seek an in-person evaluation if the symptoms worsen or if the condition fails to improve as anticipated.   BH MD OP Progress Note  12/19/2024 9:56 AM Jorge Wright  MRN:  [id number]  Chief Complaint:  Chief Complaint  Patient presents with   Anxiety   Follow-up   Depression   Alcohol Problem   Medication Refill   Discussed the use of AI scribe software for clinical note transcription with the patient, who gave verbal consent to proceed.  History of Present Illness Jorge Wright is a 38 year old Caucasian male who lives in [place], employed, currently going through divorce, has a history of bipolar disorder, panic disorder, alcohol use disorder, diabetes mellitus, ulcerative colitis, hyperlipidemia was evaluated by telemedicine today.  He continues to face ongoing challenges with alcohol use, as he describes his drinking as intermittent and notes that episodes negatively impact his life. He works on reducing alcohol consumption with support from his therapist and by attending AA meetings, although he has not yet obtained a sponsor. He reports increased difficulty managing triggers during the holidays, and he identifies  emotional and situational factors that contribute to his urges to drink. He reports taking naltrexone  for a period but stopped when he wanted to drink, and he confirms having sufficient supply to restart naltrexone .  He reports overall improvement in mood and functioning since he added Auvelity , and he states that he does not experience significant sadness or hopelessness except when drinking. He denies suicidal ideation. He reports that his sleep remains good, and he continues to take Rozerem  as needed, which he finds helpful. He confirms taking Latuda  40 mg after dinner and reports that he has not experienced side effects such as muscle spasms or jerks. He uses minimal benztropine  (Cogentin ) as needed. He currently sees his therapist Ms. Ellouise Hummer every 2 to 3 weeks, although he missed his last appointment.  He plans to attend mass and spend Christmas Eve with his girlfriend's family.    Visit Diagnosis:    ICD-10-CM   1. Bipolar disorder, in full remission, most recent episode depressed  F31.76    Type II severe ,with anxious distress    2. Panic disorder  F41.0 Dextromethorphan-buPROPion ER (AUVELITY ) 45-105 MG TBCR    3. Alcohol use disorder, severe, dependence (HCC)  F10.20       Past Psychiatric History: I have reviewed past psychiatric history from progress note on 09/27/2023.  Past trials of medications like Wellbutrin, Lexapro , trazodone  Past Medical History:  Past Medical History:  Diagnosis Date   Chicken pox    COVID-19    12/2020 sob mild, 05/21/21   Depression    Diabetes mellitus without complication (HCC)    History of alcohol abuse    Hyperlipidemia  Hypertension    UC (ulcerative colitis) [facility])     Past Surgical History:  Procedure Laterality Date   COLONOSCOPY WITH PROPOFOL  N/A 07/28/2022   Procedure: COLONOSCOPY WITH PROPOFOL ;  Surgeon: Toledo, Ladell POUR, MD;  Location: [facility] ENDOSCOPY;  Service: Gastroenterology;  Laterality: N/A;  OK'D PER PM    ESOPHAGOGASTRODUODENOSCOPY (EGD) WITH PROPOFOL  N/A 07/28/2022   Procedure: ESOPHAGOGASTRODUODENOSCOPY (EGD) WITH PROPOFOL ;  Surgeon: Toledo, Ladell POUR, MD;  Location: [facility] ENDOSCOPY;  Service: Gastroenterology;  Laterality: N/A;   MANDIBLE FRACTURE SURGERY      Family Psychiatric History: I have reviewed family psychiatric history from progress note on 09/27/2023.  Family History:  Family History  Problem Relation Age of Onset   Bipolar disorder Mother    Alcohol abuse Mother    Hypertension Mother    Heart disease Mother        had heart attack 23    Diabetes Mother        type 2   Heart attack Mother 61   Alcohol abuse Father    Hypertension Father    Diabetes Father        type 1   Pancreatitis Father    Heart attack Maternal Uncle        had heart attack    Alcohol abuse Maternal Grandmother    Heart disease Maternal Grandmother        died in 4s    Hypertension Maternal Grandmother    Depression Maternal Grandmother    Stroke Paternal Grandfather     Social History: I have reviewed social history from progress note on 09/27/2023. Social History   Socioeconomic History   Marital status: Legally Separated    Spouse name: Not on file   Number of children: 1   Years of education: Not on file   Highest education level: Bachelor's degree (e.g., BA, AB, BS)  Occupational History   Not on file  Tobacco Use   Smoking status: Former   Smokeless tobacco: Former  Building Services Engineer status: Every Day   Substances: Nicotine  Substance and Sexual Activity   Alcohol use: Yes    Alcohol/week: 6.0 standard drinks of alcohol    Types: 6 Standard drinks or equivalent per week    Comment: occasional, last drink 1 month ago, has a hx of alcoholism   Drug use: Not Currently    Types: Marijuana    Comment: occasional edibles   Sexual activity: Yes  Other Topics Concern   Not on file  Social History Narrative   Works IT    separated   Kids daughter [name]    Social Drivers  of Health   Tobacco Use: Medium Risk (12/19/2024)   Patient History    Smoking Tobacco Use: Former    Smokeless Tobacco Use: Former    Passive Exposure: Not on Actuary Strain: Low Risk (12/22/2021)   Overall Financial Resource Strain (CARDIA)    Difficulty of Paying Living Expenses: Not hard at all  Food Insecurity: Not on file  Transportation Needs: Not on file  Physical Activity: Not on file  Stress: Not on file  Social Connections: Not on file  Depression (PHQ2-9): Medium Risk (10/26/2024)   Depression (PHQ2-9)    PHQ-2 Score: 9  Alcohol Screen: Not on file  Housing: Unknown (04/06/2024)   Received from [facility]   Epic    Unable to Pay for Housing in the Last Year: Not on file    Number  of Times Moved in the Last Year: Not on file    At any time in the past 12 months, were you homeless or living in a shelter (including now)?: No  Utilities: Not on file  Health Literacy: Not on file    Allergies: Allergies[1]  Metabolic Disorder Labs: Lab Results  Component Value Date   HGBA1C 11.1 (H) 10/10/2024   No results found for: PROLACTIN Lab Results  Component Value Date   CHOL 154 08/15/2024   TRIG 68.0 08/15/2024   HDL 43.80 08/15/2024   CHOLHDL 4 08/15/2024   VLDL 13.6 08/15/2024   LDLCALC 96 08/15/2024   LDLCALC 111 (H) 12/31/2022   Lab Results  Component Value Date   TSH 0.66 07/04/2024   TSH 1.25 12/31/2022    Therapeutic Level Labs: No results found for: LITHIUM No results found for: VALPROATE No results found for: CBMZ  Current Medications: Current Outpatient Medications  Medication Sig Dispense Refill   atorvastatin  (LIPITOR) 40 MG tablet Take 1 tablet (40 mg total) by mouth daily. 90 tablet 3   benztropine  (COGENTIN ) 1 MG tablet Take 1 tablet (1 mg total) by mouth at bedtime as needed for tremors. 30 tablet 1   Continuous Glucose Sensor (DEXCOM G7 SENSOR) MISC Use as directed to monitor blood glucose.  Change sensor every 10 days. 3 each 11   Dextromethorphan-buPROPion ER (AUVELITY ) 45-105 MG TBCR Take 1 tablet by mouth in the morning. 30 tablet 5   fenofibrate  (TRICOR ) 145 MG tablet Take 1 tablet (145 mg total) by mouth daily. 90 tablet 3   hydrOXYzine  (VISTARIL ) 25 MG capsule Take 1 capsule (25 mg total) by mouth 4 (four) times daily as needed. For severe anxiety 120 capsule 2   insulin  degludec (TRESIBA ) 100 UNIT/ML FlexTouch Pen Inject 20-40 units sq once daily. Increase only as instructed to MDD 40 units. 12 mL 2   Insulin  Pen Needle 32G X 6 MM MISC Use to inject insulin  once daily as instructed. May substitute any covered brand/quantity. 100 each 3   lisinopril -hydrochlorothiazide  (ZESTORETIC ) 20-12.5 MG tablet TAKE 2 TABLETS DAILY IN THE MORNING 180 tablet 3   lurasidone  (LATUDA ) 40 MG TABS tablet Take 1 tablet (40 mg total) by mouth daily with breakfast. 30 tablet 4   metFORMIN  (GLUCOPHAGE -XR) 500 MG 24 hr tablet Take 2 tablets (1,000 mg total) by mouth 2 (two) times daily with a meal. 360 tablet 3   Na Sulfate-K Sulfate-Mg Sulfate concentrate (SUPREP) 17.5-3.13-1.6 GM/177ML SOLN Take 1 Bottle by mouth.     naltrexone  (DEPADE) 50 MG tablet Take 1 tablet (50 mg total) by mouth daily. 30 tablet 1   ramelteon  (ROZEREM ) 8 MG tablet Take 1 tablet (8 mg total) by mouth at bedtime. 30 tablet 4   No current facility-administered medications for this visit.     Musculoskeletal: Strength & Muscle Tone: UTA Gait & Station: Seated Patient leans: N/A  Psychiatric Specialty Exam: Review of Systems  Psychiatric/Behavioral: Negative.      There were no vitals taken for this visit.There is no height or weight on file to calculate BMI.  General Appearance: Casual  Eye Contact:  Fair  Speech:  Clear and Coherent  Volume:  Normal  Mood:  Euthymic  Affect:  Congruent  Thought Process:  Goal Directed and Descriptions of Associations: Intact  Orientation:  Full (Time, Place, and Person)   Thought Content: Logical   Suicidal Thoughts:  No  Homicidal Thoughts:  No  Memory:  Immediate;   Fair Recent;  Fair Remote;   Fair  Judgement:  Fair  Insight:  Fair  Psychomotor Activity:  Normal  Concentration:  Concentration: Fair and Attention Span: Fair  Recall:  [employer] of Knowledge: Fair  Language: Fair  Akathisia:  No  Handed:  Right  AIMS (if indicated): not done  Assets:  Manufacturing Systems Engineer Desire for Improvement Housing Social Support Transportation  ADL's:  Intact  Cognition: WNL  Sleep:  Fair   Screenings: Midwife Visit from 01/23/2024 in [facility] Office Visit from 11/14/2023 in [facility]  AIMS Total Score 0 0   GAD-7    Flowsheet Row Office Visit from 10/10/2024 in [facility] Visit from 09/27/2024 in [facility] Office Visit from 09/05/2024 in [facility] Office Visit from 07/04/2024 in [facility] Visit from 11/14/2023 in [facility]  Total GAD-7 Score 14 13 20 19 17    PHQ2-9    Flowsheet Row Video Visit from 10/26/2024 in [facility] Office Visit from 10/10/2024 in [facility] Visit from 09/27/2024 in [facility] Office Visit from 09/05/2024 in [facility] Office Visit from 07/04/2024 in [facility] [place]  PHQ-2 Total Score 2 5 4 6 4   PHQ-9 Total Score 9 18 17 21 18    Flowsheet Row Video Visit from 12/19/2024 in [facility] Video Visit from 11/19/2024 in [facility] Video Visit from 10/26/2024 in [facility]  C-SSRS RISK CATEGORY No Risk No Risk Low Risk     Assessment and Plan: Jorge Wright is a 39 year old Caucasian male who presented for a follow-up appointment, discussed assessment and plan as noted below.  1. Bipolar disorder, in full remission, most recent episode depressed Currently reports overall mood symptoms as stable except when he relapses on alcohol.  Currently working on the same. Continue Latuda  40 mg daily Continue Benztropine  1 mg at bedtime as needed Continue Auvelity  45-105 mg daily in the morning Continue Rozerem  8 mg at bedtime  2. Panic disorder-improving Currently denies any significant anxiety or panic attacks Continue Hydroxyzine  25 mg 4 times a day as needed Continue CBT with Ms. Ellouise Hummer  3. Alcohol use disorder, severe, dependence (HCC)-unstable Episodic relapse on alcohol.  Currently noncompliant with naltrexone . Restart Naltrexone  50 mg daily Provided brief supportive psychotherapy. Encouraged to follow-up with Ms. Ellouise Hummer. Encouraged to continue AA meetings.  Follow-up Follow-up in clinic in 6 weeks or sooner in person.  Collaboration of Care: Collaboration of Care: Referral or follow-up with counselor/therapist AEB encouraged to follow-up with Ms. Ellouise Hummer, will coordinate care.  Patient/Guardian was advised Release of Information must be obtained prior to any record release in order to collaborate their care with an outside provider. Patient/Guardian was advised if they have not already done so to contact the registration department to sign all necessary forms in order for us  to release information regarding their care.   Consent: Patient/Guardian gives verbal consent for treatment and assignment of benefits for services provided during this visit. Patient/Guardian expressed understanding and agreed to proceed.  This note was generated in  part or whole with voice recognition software. Voice  recognition is usually quite accurate but there are transcription errors that can and very often do occur. I apologize for any typographical errors that were not detected and corrected.     Marena Witts, MD 12/19/2024, 9:56 AM     [1]  Allergies Allergen Reactions   Mounjaro  [Tirzepatide ]     Injection site reaction    Ozempic  (0.25 Or 0.5 Mg-Dose) [Semaglutide (0.25 Or 0.5mg -Dos)]     N/v/diarrhea/ab pain    Trulicity  [Dulaglutide ]     Gi Sxs   Invokana  [Canagliflozin ] Rash    Reports topical rash on trunk that occurred after taking Invokana  for about a week. Resolved after medication discontinuation. No airway involvement.    Penicillins Rash

## 2025-01-29 ENCOUNTER — Ambulatory Visit: Admitting: Psychology

## 2025-02-04 ENCOUNTER — Ambulatory Visit: Payer: Self-pay | Admitting: Psychology

## 2025-02-18 ENCOUNTER — Ambulatory Visit: Admitting: Psychiatry
